# Patient Record
Sex: Female | Born: 1950
Health system: Southern US, Community
[De-identification: ages and names within clinical notes are randomized; demographics above are authoritative.]

## PROBLEM LIST (undated history)

## (undated) DIAGNOSIS — J189 Pneumonia, unspecified organism: Secondary | ICD-10-CM

## (undated) DIAGNOSIS — E785 Hyperlipidemia, unspecified: Secondary | ICD-10-CM

## (undated) DIAGNOSIS — F419 Anxiety disorder, unspecified: Secondary | ICD-10-CM

## (undated) DIAGNOSIS — J90 Pleural effusion, not elsewhere classified: Secondary | ICD-10-CM

## (undated) DIAGNOSIS — E079 Disorder of thyroid, unspecified: Secondary | ICD-10-CM

## (undated) DIAGNOSIS — M069 Rheumatoid arthritis, unspecified: Secondary | ICD-10-CM

## (undated) HISTORY — PX: COLONOSCOPY: SHX174

## (undated) HISTORY — DX: Hyperlipidemia, unspecified: E78.5

## (undated) HISTORY — DX: Anxiety disorder, unspecified: F41.9

## (undated) HISTORY — PX: LAPAROSCOPY: SHX197

## (undated) HISTORY — DX: Disorder of thyroid, unspecified: E07.9

## (undated) HISTORY — PX: TUBAL LIGATION: SHX77

---

## 1993-10-26 DIAGNOSIS — F418 Other specified anxiety disorders: Secondary | ICD-10-CM | POA: Insufficient documentation

## 1993-10-26 HISTORY — PX: VAGINAL HYSTERECTOMY: SHX2639

## 2003-10-27 DIAGNOSIS — E039 Hypothyroidism, unspecified: Secondary | ICD-10-CM | POA: Insufficient documentation

## 2007-10-27 DIAGNOSIS — E7800 Pure hypercholesterolemia, unspecified: Secondary | ICD-10-CM | POA: Insufficient documentation

## 2007-10-27 DIAGNOSIS — E785 Hyperlipidemia, unspecified: Secondary | ICD-10-CM | POA: Insufficient documentation

## 2008-10-26 DIAGNOSIS — M069 Rheumatoid arthritis, unspecified: Secondary | ICD-10-CM | POA: Insufficient documentation

## 2009-07-13 ENCOUNTER — Ambulatory Visit: Payer: Self-pay | Admitting: Diagnostic Radiology

## 2009-07-13 ENCOUNTER — Emergency Department (HOSPITAL_BASED_OUTPATIENT_CLINIC_OR_DEPARTMENT_OTHER): Admission: EM | Admit: 2009-07-13 | Discharge: 2009-07-13 | Payer: Self-pay | Admitting: Emergency Medicine

## 2011-01-30 LAB — CBC
HCT: 39.2 % (ref 36.0–46.0)
Hemoglobin: 13.7 g/dL (ref 12.0–15.0)
MCHC: 35 g/dL (ref 30.0–36.0)
MCV: 87.1 fL (ref 78.0–100.0)
Platelets: 245 10*3/uL (ref 150–400)
RBC: 4.5 MIL/uL (ref 3.87–5.11)
RDW: 11.3 % — ABNORMAL LOW (ref 11.5–15.5)
WBC: 9.1 10*3/uL (ref 4.0–10.5)

## 2011-01-30 LAB — BASIC METABOLIC PANEL
BUN: 20 mg/dL (ref 6–23)
CO2: 31 mEq/L (ref 19–32)
Calcium: 9.5 mg/dL (ref 8.4–10.5)
Chloride: 102 mEq/L (ref 96–112)
Creatinine, Ser: 0.8 mg/dL (ref 0.4–1.2)
GFR calc Af Amer: >60 mL/min (ref >60)
GFR calc non Af Amer: >60 mL/min (ref >60)
Glucose, Bld: 96 mg/dL (ref 70–99)
Potassium: 4 mEq/L (ref 3.5–5.1)
Sodium: 140 mEq/L (ref 135–145)

## 2011-01-30 LAB — DIFFERENTIAL
Basophils Absolute: 0.2 10*3/uL — ABNORMAL HIGH (ref 0.0–0.1)
Basophils Relative: 2 % — ABNORMAL HIGH (ref 0–1)
Eosinophils Absolute: 0.1 10*3/uL (ref 0.0–0.7)
Eosinophils Relative: 1 % (ref 0–5)
Lymphocytes Relative: 12 % (ref 12–46)
Lymphs Abs: 1.1 10*3/uL (ref 0.7–4.0)
Monocytes Absolute: 0.6 10*3/uL (ref 0.1–1.0)
Monocytes Relative: 7 % (ref 3–12)
Neutro Abs: 7.1 10*3/uL (ref 1.7–7.7)
Neutrophils Relative %: 78 % — ABNORMAL HIGH (ref 43–77)

## 2011-08-03 DIAGNOSIS — M069 Rheumatoid arthritis, unspecified: Secondary | ICD-10-CM | POA: Insufficient documentation

## 2012-01-11 ENCOUNTER — Other Ambulatory Visit (HOSPITAL_BASED_OUTPATIENT_CLINIC_OR_DEPARTMENT_OTHER): Payer: Self-pay | Admitting: *Deleted

## 2012-01-11 ENCOUNTER — Other Ambulatory Visit (HOSPITAL_BASED_OUTPATIENT_CLINIC_OR_DEPARTMENT_OTHER): Payer: Self-pay | Admitting: Family Medicine

## 2012-01-11 DIAGNOSIS — Z1231 Encounter for screening mammogram for malignant neoplasm of breast: Secondary | ICD-10-CM

## 2012-01-18 ENCOUNTER — Ambulatory Visit (HOSPITAL_BASED_OUTPATIENT_CLINIC_OR_DEPARTMENT_OTHER)
Admission: RE | Admit: 2012-01-18 | Discharge: 2012-01-18 | Disposition: A | Discharge status: Home / Self Care | Payer: 59 | Source: Ambulatory Visit | Attending: Family Medicine | Admitting: Family Medicine

## 2012-01-18 DIAGNOSIS — Z1231 Encounter for screening mammogram for malignant neoplasm of breast: Secondary | ICD-10-CM | POA: Insufficient documentation

## 2012-04-05 ENCOUNTER — Encounter: Payer: Self-pay | Admitting: Pharmacist

## 2012-04-05 ENCOUNTER — Ambulatory Visit (INDEPENDENT_AMBULATORY_CARE_PROVIDER_SITE_OTHER): Payer: 59 | Admitting: Pharmacist

## 2012-04-05 VITALS — BP 137/81 | HR 72 | Ht 64.0 in | Wt 156.0 lb

## 2012-04-05 DIAGNOSIS — F341 Dysthymic disorder: Secondary | ICD-10-CM

## 2012-04-05 DIAGNOSIS — F418 Other specified anxiety disorders: Secondary | ICD-10-CM

## 2012-04-05 DIAGNOSIS — E785 Hyperlipidemia, unspecified: Secondary | ICD-10-CM

## 2012-04-05 DIAGNOSIS — E039 Hypothyroidism, unspecified: Secondary | ICD-10-CM

## 2012-04-05 DIAGNOSIS — M069 Rheumatoid arthritis, unspecified: Secondary | ICD-10-CM

## 2012-04-05 NOTE — Assessment & Plan Note (Signed)
Following medication review, no suggestions for change. We discussed using DMARDs such as Enbrel and her concerns with side effects. Patient understands it is her decision in regards to starting such medications and importance of balancing the benefits with the risks.  Complete medication list provided to patient.  Total time in face to face medication review: 20 minutes.  Patient seen with: Brett Fairy, PharmD, Pharmacy Resident.

## 2012-04-05 NOTE — Progress Notes (Signed)
  Subjective:    Patient ID: Pamela Ingram, female    DOB: [birth year], 61 y.o.   MRN: [id number]  HPI Patient arrives in good spirits for medication review.  Reports seeing Armando Gang, NP ([facility]) as primary care provider, Dr. Sigurd Sos [facility]) as rheumatologist.  Reports being diagnosed with rheumatoid arthritis since 2010 and is currently taking prednisone bursts.     Review of Systems     Objective:   Physical Exam        Assessment & Plan:  Following medication review, no suggestions for change. We discussed using DMARDs such as Enbrel and her concerns with side effects. Patient understands it is her decision in regards to starting such medications and importance of balancing the benefits with the risks.  Complete medication list provided to patient.  Total time in face to face medication review: 20 minutes.  Patient seen with: Brett Fairy, PharmD, Pharmacy Resident.

## 2012-04-05 NOTE — Progress Notes (Signed)
Patient ID: Pamela Ingram, female   DOB: 04/16/1951, 61 y.o.   MRN: [id number] Reviewed and agree with Dr. Macky Lower management.

## 2012-04-05 NOTE — Patient Instructions (Addendum)
1. Begin taking 1000 IU vitamin D daily 2. Take one pill of calcium citrate daily with water + meal but apart from other dairy products. Aim for up to 1200mg  calcium a day.

## 2012-09-15 ENCOUNTER — Encounter: Payer: Self-pay | Admitting: Internal Medicine

## 2012-10-14 ENCOUNTER — Ambulatory Visit (AMBULATORY_SURGERY_CENTER): Payer: 59 | Admitting: *Deleted

## 2012-10-14 ENCOUNTER — Ambulatory Visit: Payer: 59

## 2012-10-14 VITALS — Ht 64.0 in | Wt 153.0 lb

## 2012-10-14 DIAGNOSIS — Z1211 Encounter for screening for malignant neoplasm of colon: Secondary | ICD-10-CM

## 2012-10-18 ENCOUNTER — Encounter: Payer: Self-pay | Admitting: *Deleted

## 2012-10-25 ENCOUNTER — Encounter: Payer: Self-pay | Admitting: Internal Medicine

## 2012-10-25 ENCOUNTER — Ambulatory Visit (AMBULATORY_SURGERY_CENTER): Payer: 59 | Admitting: Internal Medicine

## 2012-10-25 VITALS — BP 111/47 | HR 69 | Temp 97.9°F | Resp 12 | Ht 64.0 in | Wt 153.0 lb

## 2012-10-25 DIAGNOSIS — Z1211 Encounter for screening for malignant neoplasm of colon: Secondary | ICD-10-CM

## 2012-10-25 MED ORDER — SODIUM CHLORIDE 0.9 % IV SOLN: 500.0000 mL | INTRAVENOUS | Status: DC

## 2012-10-25 NOTE — Progress Notes (Signed)
Patient did not experience any of the following events: a burn prior to discharge; a fall within the facility; wrong site/side/patient/procedure/implant event; or a hospital transfer or hospital admission upon discharge from the facility. (G8907) Patient did not have preoperative order for IV antibiotic SSI prophylaxis. (G8918)  

## 2012-10-25 NOTE — Patient Instructions (Addendum)
Your colonoscopy was normal!  Let's so this again in 2023.  Thank you for choosing me and [facility].  Iva Boop, MD, [facility]  Discharge instructions given with verbal understanding. Normal exam. Resume previous medications. Please refer to the blue and neon green sheets for instructions regarding diet and activity for the rest of today.  YOU HAD AN ENDOSCOPIC PROCEDURE TODAY AT THE [facility]: Refer to the procedure report that was given to you for any specific questions about what was found during the examination.  If the procedure report does not answer your questions, please call your gastroenterologist to clarify.  If you requested that your care partner not be given the details of your procedure findings, then the procedure report has been included in a sealed envelope for you to review at your convenience later.  YOU SHOULD EXPECT: Some feelings of bloating in the abdomen. Passage of more gas than usual.  Walking can help get rid of the air that was put into your GI tract during the procedure and reduce the bloating. If you had a lower endoscopy (such as a colonoscopy or flexible sigmoidoscopy) you may notice spotting of blood in your stool or on the toilet paper. If you underwent a bowel prep for your procedure, then you may not have a normal bowel movement for a few days.  DIET: Your first meal following the procedure should be a light meal and then it is ok to progress to your normal diet.  A half-sandwich or bowl of soup is an example of a good first meal.  Heavy or fried foods are harder to digest and may make you feel nauseous or bloated.  Likewise meals heavy in dairy and vegetables can cause extra gas to form and this can also increase the bloating.  Drink plenty of fluids but you should avoid alcoholic beverages for 24 hours.  ACTIVITY: Your care partner should take you home directly after the procedure.  You should plan to take it easy, moving slowly  for the rest of the day.  You can resume normal activity the day after the procedure however you should NOT DRIVE or use heavy machinery for 24 hours (because of the sedation medicines used during the test).    SYMPTOMS TO REPORT IMMEDIATELY: A gastroenterologist can be reached at any hour.  During normal business hours, 8:30 AM to 5:00 PM Monday through Friday, call [phone].  After hours and on weekends, please call the GI answering service at [phone] who will take a message and have the physician on call contact you.   Following lower endoscopy (colonoscopy or flexible sigmoidoscopy):  Excessive amounts of blood in the stool  Significant tenderness or worsening of abdominal pains  Swelling of the abdomen that is new, acute  Fever of 100F or higher  FOLLOW UP: If any biopsies were taken you will be contacted by phone or by letter within the next 1-3 weeks.  Call your gastroenterologist if you have not heard about the biopsies in 3 weeks.  Our staff will call the home number listed on your records the next business day following your procedure to check on you and address any questions or concerns that you may have at that time regarding the information given to you following your procedure. This is a courtesy call and so if there is no answer at the home number and we have not heard from you through the emergency physician on call, we will assume that you  have returned to your regular daily activities without incident.  SIGNATURES/CONFIDENTIALITY: You and/or your care partner have signed paperwork which will be entered into your electronic medical record.  These signatures attest to the fact that that the information above on your After Visit Summary has been reviewed and is understood.  Full responsibility of the confidentiality of this discharge information lies with you and/or your care-partner.  

## 2012-10-25 NOTE — Op Note (Signed)
[facility] [address] [place], [address]   COLONOSCOPY PROCEDURE REPORT  PATIENT: Pamela Ingram, Pamela Ingram  MR#: [id number] BIRTHDATE: 1951/01/05 , 61  yrs. old GENDER: Female ENDOSCOPIST: Iva Boop, MD, [facility] REFERRED BY:  Daneil Dolin, MD PROCEDURE DATE:  10/25/2012 PROCEDURE:   Colonoscopy, diagnostic ASA CLASS:   Class I INDICATIONS:average risk screening. MEDICATIONS: propofol (Diprivan) 300mg  IV, MAC sedation, administered by CRNA, and These medications were titrated to patient response per physician's verbal order  DESCRIPTION OF PROCEDURE:   After the risks benefits and alternatives of the procedure were thoroughly explained, informed consent was obtained.  A digital rectal exam revealed no abnormalities of the rectum.   The LB [id number]  endoscope was introduced through the anus and advanced to the cecum, which was identified by both the appendix and ileocecal valve. No adverse events experienced.   The quality of the prep was Suprep good  The instrument was then slowly withdrawn as the colon was fully examined.      COLON FINDINGS: A normal appearing cecum, ileocecal valve, and appendiceal orifice were identified.  The ascending, hepatic flexure, transverse, splenic flexure, descending, sigmoid colon and rectum appeared unremarkable.  No polyps or cancers were seen.   A right colon retroflexion was performed.  Retroflexed views revealed no abnormalities. The time to cecum=6 minutes 26 seconds. Withdrawal time=12 minutes 46 seconds.  The scope was withdrawn and the procedure completed. COMPLICATIONS: There were no complications.  ENDOSCOPIC IMPRESSION: Normal colonoscopy, good prep  RECOMMENDATIONS: Repeat Colonscopy in 10 years.   eSigned:  Iva Boop, MD, [facility] 10/25/2012 12:02 PM   cc: The Patient    and Daneil Dolin, MD

## 2012-10-27 ENCOUNTER — Telehealth: Payer: Self-pay

## 2012-10-27 ENCOUNTER — Encounter: Payer: Self-pay | Admitting: *Deleted

## 2012-10-27 NOTE — Telephone Encounter (Signed)
Error. ewm

## 2012-10-27 NOTE — Telephone Encounter (Signed)
  Follow up Call-  Call back number 10/25/2012  Post procedure Call Back phone  # [phone]  Permission to leave phone message Yes     Patient questions:  Do you have a fever, pain , or abdominal swelling? no Pain Score  0 *  Have you tolerated food without any problems? yes  Have you been able to return to your normal activities? yes  Do you have any questions about your discharge instructions: Diet   no Medications  no Follow up visit  no  Do you have questions or concerns about your Care? no  Actions: * If pain score is 4 or above: No action needed, pain <4.   No problems per the pt.  "everyone was wonderful". Maw

## 2012-12-20 ENCOUNTER — Ambulatory Visit (INDEPENDENT_AMBULATORY_CARE_PROVIDER_SITE_OTHER): Payer: Self-pay | Admitting: Family Medicine

## 2012-12-20 DIAGNOSIS — Z711 Person with feared health complaint in whom no diagnosis is made: Secondary | ICD-10-CM

## 2013-01-02 ENCOUNTER — Emergency Department (HOSPITAL_BASED_OUTPATIENT_CLINIC_OR_DEPARTMENT_OTHER): Payer: 59

## 2013-01-02 ENCOUNTER — Encounter (HOSPITAL_BASED_OUTPATIENT_CLINIC_OR_DEPARTMENT_OTHER): Payer: Self-pay | Admitting: Family Medicine

## 2013-01-02 ENCOUNTER — Emergency Department (HOSPITAL_BASED_OUTPATIENT_CLINIC_OR_DEPARTMENT_OTHER)
Admission: EM | Admit: 2013-01-02 | Discharge: 2013-01-02 | Disposition: A | Discharge status: Home / Self Care | Payer: 59 | Attending: Emergency Medicine | Admitting: Emergency Medicine

## 2013-01-02 DIAGNOSIS — J3489 Other specified disorders of nose and nasal sinuses: Secondary | ICD-10-CM | POA: Insufficient documentation

## 2013-01-02 DIAGNOSIS — H9209 Otalgia, unspecified ear: Secondary | ICD-10-CM | POA: Insufficient documentation

## 2013-01-02 DIAGNOSIS — Z8739 Personal history of other diseases of the musculoskeletal system and connective tissue: Secondary | ICD-10-CM | POA: Insufficient documentation

## 2013-01-02 DIAGNOSIS — R0981 Nasal congestion: Secondary | ICD-10-CM

## 2013-01-02 DIAGNOSIS — Z79899 Other long term (current) drug therapy: Secondary | ICD-10-CM | POA: Insufficient documentation

## 2013-01-02 DIAGNOSIS — E039 Hypothyroidism, unspecified: Secondary | ICD-10-CM | POA: Insufficient documentation

## 2013-01-02 DIAGNOSIS — E785 Hyperlipidemia, unspecified: Secondary | ICD-10-CM | POA: Insufficient documentation

## 2013-01-02 DIAGNOSIS — R51 Headache: Secondary | ICD-10-CM | POA: Insufficient documentation

## 2013-01-02 DIAGNOSIS — R110 Nausea: Secondary | ICD-10-CM | POA: Insufficient documentation

## 2013-01-02 DIAGNOSIS — IMO0002 Reserved for concepts with insufficient information to code with codable children: Secondary | ICD-10-CM | POA: Insufficient documentation

## 2013-01-02 DIAGNOSIS — F411 Generalized anxiety disorder: Secondary | ICD-10-CM | POA: Insufficient documentation

## 2013-01-02 DIAGNOSIS — Z87891 Personal history of nicotine dependence: Secondary | ICD-10-CM | POA: Insufficient documentation

## 2013-01-02 HISTORY — DX: Rheumatoid arthritis, unspecified: M06.9

## 2013-01-02 MED ORDER — ONDANSETRON 8 MG PO TBDP
8.0000 mg | ORAL_TABLET | Freq: Once | ORAL | Status: AC
  Administered 2013-01-02: 8 mg via ORAL
  Filled 2013-01-02: qty 1

## 2013-01-02 MED ORDER — CETIRIZINE-PSEUDOEPHEDRINE ER 5-120 MG PO TB12: 1.0000 | ORAL_TABLET | Freq: Every day | ORAL | Status: DC

## 2013-01-02 MED ORDER — KETOROLAC TROMETHAMINE 60 MG/2ML IM SOLN: 60.0000 mg | Freq: Once | INTRAMUSCULAR | Status: DC

## 2013-01-02 MED ORDER — KETOROLAC TROMETHAMINE 60 MG/2ML IM SOLN
60.0000 mg | Freq: Once | INTRAMUSCULAR | Status: AC
  Administered 2013-01-02: 60 mg via INTRAMUSCULAR
  Filled 2013-01-02: qty 2

## 2013-01-02 MED ORDER — FLUTICASONE PROPIONATE 50 MCG/ACT NA SUSP: 2.0000 | Freq: Every day | NASAL | Status: DC

## 2013-01-02 MED ORDER — HYDROCODONE-ACETAMINOPHEN 5-325 MG PO TABS
1.0000 | ORAL_TABLET | Freq: Once | ORAL | Status: AC
  Administered 2013-01-02: 1 via ORAL
  Filled 2013-01-02: qty 1

## 2013-01-02 NOTE — ED Notes (Signed)
Pt c/o headache since awakening and c/o sinus pressure. Pt sts she feels nauseous.

## 2013-01-02 NOTE — ED Notes (Signed)
Patient transported to X-ray 

## 2013-01-02 NOTE — ED Provider Notes (Signed)
History     CSN: [id number]  Arrival date & time 01/02/13  1208   First MD Initiated Contact with Patient 01/02/13 1252      Chief Complaint  Patient presents with  . Headache    (Consider location/radiation/quality/duration/timing/severity/associated sxs/prior treatment) HPI Pamela Ingram is a 62 y.o. female who presents to ED with complaint of headache, nausea. Pt states she woke up with this headache. Pain to the left side of the head, nasal congestion. States feels like having sinus pressure. Tried steamy room and ibuprofen, with no relief. States headache making her nauseated, vomited x1 after taking ibuprofen. Denies photophobia or sensitivity to sound. Headache constant since this morning, but states feels like easing off some. No visual changes. No pain in the temples. No neck pain or stiffness. States recently has had "issues" with alternating ear pain and teeth pain. Was seen for this, told this may be eustachian tube issue. States currently is having pain in left ear, and pain with she chews. No pain with movement of the jaw. Denies dizziness, lightheadedness, weakness or numbness of extremities.  Past Medical History  Diagnosis Date  . Anxiety   . Hyperlipidemia   . Thyroid disease     hypothyroidism  . Rheumatoid arthritis     Past Surgical History  Procedure Laterality Date  . Vaginal hysterectomy  1995  . Tubal ligation    .  laparoscospy    . Colonoscopy      Family History  Problem Relation Age of Onset  . Colon cancer Neg Hx   . Rectal cancer Neg Hx   . Stomach cancer Neg Hx   . Esophageal cancer Neg Hx     History  Substance Use Topics  . Smoking status: Former Smoker -- 0.50 packs/day for 44 years    Quit date: 10/26/2009  . Smokeless tobacco: Never Used  . Alcohol Use: Yes     Comment: occasional    OB History   Grav Para Term Preterm Abortions TAB SAB Ect Mult Living                  Review of Systems  Constitutional: Negative for fever and  chills.  HENT: Positive for ear pain, congestion and sinus pressure. Negative for sore throat, neck pain and neck stiffness.   Eyes: Negative for photophobia, pain and visual disturbance.  Respiratory: Negative.   Cardiovascular: Negative.   Gastrointestinal: Positive for nausea. Negative for vomiting, abdominal pain and diarrhea.  Endocrine: Negative.   Genitourinary: Negative for dysuria.  Musculoskeletal: Negative for myalgias.  Skin: Negative for rash.  Neurological: Positive for headaches. Negative for dizziness, speech difficulty, weakness, light-headedness and numbness.    Allergies  Amoxicillin  Home Medications   Current Outpatient Rx  Name  Route  Sig  Dispense  Refill  . ALPRAZolam (XANAX) 0.5 MG tablet   Oral   Take 1 tablet (0.5 mg total) by mouth daily as needed for sleep.         . Cholecalciferol (VITAMIN D PO)   Oral   Take 1 tablet by mouth daily.         . DULoxetine (CYMBALTA) 60 MG capsule   Oral   Take 1 capsule (60 mg total) by mouth daily.         . folic acid (FOLVITE) 1 MG tablet   Oral   Take 1 mg by mouth daily.         [name] levothyroxine (SYNTHROID, LEVOTHROID) 50 MCG tablet  Oral   Take 1 tablet (50 mcg total) by mouth daily.         . methotrexate (RHEUMATREX) 2.5 MG tablet   Oral   Take 8 tablets (20 mg total) by mouth once a week. Caution:Chemotherapy. Protect from light. Take once a week on saturdays         . predniSONE (DELTASONE) 5 MG tablet   Oral   Take 3 tablets (15 mg total) by mouth daily. Follow taper         . simvastatin (ZOCOR) 80 MG tablet   Oral   Take 1 tablet (80 mg total) by mouth at bedtime.           BP 121/71  Pulse 84  Temp(Src) 97.7 F (36.5 C) (Oral)  Resp 26  SpO2 98%  Physical Exam  Nursing note and vitals reviewed. Constitutional: She is oriented to person, place, and time. She appears well-developed and well-nourished. No distress.  HENT:  Head: Normocephalic and atraumatic.   Right Ear: External ear normal.  Left Ear: External ear normal.  Nose: Nose normal.  Mouth/Throat: Oropharynx is clear and moist.  TMs normal bilaterally. No tenderness over bilateral TMJs and temporal area.   Eyes: Conjunctivae and EOM are normal. Pupils are equal, round, and reactive to light.  Neck: Normal range of motion. Neck supple.  Cardiovascular: Normal rate, regular rhythm and normal heart sounds.   Pulmonary/Chest: Effort normal and breath sounds normal. No respiratory distress. She has no wheezes. She has no rales.  Abdominal: Soft. Bowel sounds are normal. She exhibits no distension. There is no tenderness. There is no rebound.  Musculoskeletal: She exhibits no edema.  Neurological: She is alert and oriented to person, place, and time. No cranial nerve deficit. Coordination normal.  Visual fields intact. 5/5 and equal upper and lower extremity strength bilaterally. Equal grip strength bilaterally. Normal finger to nose and heel to shin. No pronator drift.  Skin: Skin is warm and dry.  Psychiatric: She has a normal mood and affect.    ED Course  Procedures (including critical care time)  PT is a 61yo with new headache. Her neuro exam is normal. Discussed with Dr. Denton Lank, will get CT head, vicodin ordrered for pain.   Ct Head Wo Contrast  01/02/2013  *RADIOLOGY REPORT*  Clinical Data: Left parietal headache since this morning.  Nausea.  CT HEAD WITHOUT CONTRAST  Technique:  Contiguous axial images were obtained from the base of the skull through the vertex without contrast.  Comparison: None.  Findings: Mildly enlarged ventricles and subarachnoid spaces. Otherwise, normal appearing cerebral hemispheres and posterior fossa structures.  No intracranial hemorrhage, mass lesion or CT evidence of acute infarction.  Unremarkable bones and included paranasal sinuses.  Pneumatization of the anterior aspect of the right middle nasal turbinate.  IMPRESSION:  1.  No acute abnormality. 2.   Mild diffuse cerebral atrophy.   Original Report Authenticated By: Beckie Salts, M.D.       1. Headache   2. Nasal congestion       MDM  Pt with left sided headache. No hx of migraines. Having some nasal congestion also onset this morning. Woke up with this pain, pain improving. No neck pain or stiffness, no meningismus. No thunderclap type headache, doubt SAH. Normal neuro exam. CT head negative. Pt feeling better with vicodin and toradol IM. Pt stable for d/c home with close follow up.           Lottie Mussel, PA-C  01/02/13 1425 

## 2013-01-03 NOTE — ED Provider Notes (Signed)
Medical screening examination/treatment/procedure(s) were conducted as a shared visit with non-physician practitioner(s) and myself.  I personally evaluated the patient during the encounter Pt c/o frontal headache.  Somewhat similar to prior but much more prolonged. No neck pain or stiffness. No eye pain or change in vision. No neck stiffness on exam. No temporal tenderness. Motor intact. Steady gait.   Suzi Roots, MD 01/03/13 2032

## 2013-03-13 ENCOUNTER — Encounter: Payer: Self-pay | Admitting: Family Medicine

## 2013-03-13 ENCOUNTER — Ambulatory Visit (INDEPENDENT_AMBULATORY_CARE_PROVIDER_SITE_OTHER): Payer: 59 | Admitting: Family Medicine

## 2013-03-13 VITALS — Ht 64.0 in | Wt 156.7 lb

## 2013-03-13 DIAGNOSIS — E785 Hyperlipidemia, unspecified: Secondary | ICD-10-CM

## 2013-03-13 DIAGNOSIS — E663 Overweight: Secondary | ICD-10-CM

## 2013-03-13 DIAGNOSIS — Z6825 Body mass index (BMI) 25.0-25.9, adult: Secondary | ICD-10-CM

## 2013-03-13 NOTE — Progress Notes (Signed)
Medical Nutrition Therapy:  Appt start time: 0900 end time:  1000.  Assessment:  Primary concerns today: Weight management.  Yuriko has a h/o hyperlipidemia, which is medication-controlled, but she is more concerned about her current weight.  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  Her work is now day-time, and she has time for meals, but she can't manage to lose the wt.   Aryonna has recently had pain that does not seem to be related to her RA, including left chest pain yesterday (accompanied by fever) that was so severe, she had to lie down.  Will see her PCP today re. this.   Usual eating pattern includes 3 meals and 1 snack per day. Usual physical activity includes walking 30 min 3-4 X wk. Frequent foods include water, diet drinks 3-4 X wk, 3-4 c coffee w/ powdered creamer.  Avoided foods include none.   Tishie gets up at 5:15 AM on weekdays, and usual bedtime is ~11 PM.    24-hr recall: (Up at 7 AM) B ( AM)-   Coffee w/ creamer (no bkfst; grandchildren visiting) Snk ( AM)-   none L ( PM)-  None (b/c of severe pain; laid down) Snk ( PM)-  none D ( PM)-  1 hamburger w/ chs, no bread, 1/2 c waffle fries, 3 full graham crackers (@60  kcal), 1/2 c fruit [place] yogurt Snk ( PM)-  1 1/2 Twizzlers, 1 orange slice candy  Yesterday was atypical; more typical day:    B ( AM)-  1 1/2 c raisin bran w/ 1/2 c almond milk Snk ( AM)-  none L ( PM)-  pb sandw & fruit OR dinner leftovers Snk ( PM)-  none D ( PM)-  Meat, veg, +/- starch Snk ( PM)-  Yogurt, cinnamon graham crackers (eats hs snack ~3 X wk; trying to not eat after dinner)  Progress Towards Goal(s):  In progress.   Nutritional Diagnosis:  NB-2.1 Physical inactivity As related to nutritional intake.  As evidenced by exercise limited to 30-min walk 3-4 X wk. [place]-3.3 Overweight/obesity As related to positive energy balance.  As evidenced by BMI >25.    Intervention:  Nutrition  education.  Monitoring/Evaluation:  Dietary intake, exercise, and body weight in 1 month(s).

## 2013-03-13 NOTE — Patient Instructions (Addendum)
-   You probably need more sleep.    - Goal:  Track the number of hours of sleep you get nightly, and keep a tally of the weekly sleep hours.   - Watch this video, Pamela Ingram:  [email or web address].[email or web address]  - One point to emphasize:  Effects of evening walk.  - Goal:  At least 30 min walk after dinner 5 X wk.  - Track your # of minutes walked each time.   - Goal: Obtain twice as many veg's as protein or carbohydrate foods for both lunch and dinner.  - Include some protein at breakfast, i.e., a full cup of soy/dairy milk, 1-2 eggs, ([place]) yogurt (half plain, half sweetened).  - Track your meals that include protein and veg's.  - Write a list of 7-10 meals that fulfill these conditions:  - Taste food.  - Relatively easy to prepare.  - meet your nutrition needs.   - Bring this list to your next appt.

## 2013-03-14 ENCOUNTER — Ambulatory Visit (INDEPENDENT_AMBULATORY_CARE_PROVIDER_SITE_OTHER): Payer: 59 | Admitting: Pulmonary Disease

## 2013-03-14 ENCOUNTER — Encounter: Payer: Self-pay | Admitting: Pulmonary Disease

## 2013-03-14 VITALS — BP 124/60 | HR 93 | Temp 96.9°F | Ht 64.0 in | Wt 162.2 lb

## 2013-03-14 DIAGNOSIS — J90 Pleural effusion, not elsewhere classified: Secondary | ICD-10-CM

## 2013-03-14 MED ORDER — IBUPROFEN 200 MG PO CAPS: 800.0000 mg | ORAL_CAPSULE | Freq: Three times a day (TID) | ORAL | Status: DC

## 2013-03-14 NOTE — Patient Instructions (Signed)
Ibuprofen 800 mg three times daily for 1 week, and then gradually taper dose as tolerated Follow up in 3 to 4 weeks with chest xray

## 2013-03-14 NOTE — Assessment & Plan Note (Addendum)
Her symptoms started as upper respiratory with cough, and progressed to left sided pleuritic chest pain.  This is in the setting of rheumatoid arthritis on methotrexate.  She reports gradual symptomatic improvement after treatment with prednisone, antibiotics, and now scheduled ibuprofen.  She has mild left base pleural thickening and small pleural effusion on recent CT chest >> not large enough to warrant any invasive interventions for sampling.  There are no other worrisome findings on CT chest.    Advised her to continue scheduled ibuprofen 800 mg tid for one week, and then gradually taper off as tolerated.  Will then repeat chest xray in 3 to 4 weeks.

## 2013-03-14 NOTE — Progress Notes (Signed)
Chief Complaint  Patient presents with  . Advice Only    C/o atypical CP, and ct finding from yesterday.     History of Present Illness: Pamela Ingram is a 62 y.o. female former smoker with hx of rheumatoid arthritis for evaluation of chest pain.  She was doing well until 3 weeks ago.  She was seen April 21 for an earache,and hoarseness.  She felt like she was getting a cold and was coughing a lot.  She was seen again on April 26 with concern for sinusitis.    She was given a Zpak >> this helped some.  She developed severe mid-sternal chest pain.  This was persistent and felt like some one was stabbing her with a knife, and she couldn't sleep.  She thought she pulled a muscle from coughing.  She tried muscle relaxer and ibuprofen.  She was seen again on May 3, and was noted to have wheezing on exam.  She was given prednisone taper and hydrocodone.  Prednisone seemed to help some, but her symptoms recurred as prednisone was decreased.  She would get pain any time she moved the left side of her chest or take a deep breath, and her breathing would catch if she takes a deep breath.  She was seen again on May 6.  She ECG reported normal at that time even though she was having chest pain.  She was tried on dexilant, but this did not help >> she took this for a few days.  She had temperature up to 100.2 associated with chills associated with recurrence of her pain.  She was seen again on May 19.  She had CT chest and lab work done.  She has since been taking 800 mg ibuprofen TID, and pain medication.  She feels her pain is now 75% better.  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  She spoke with GI physicians, and was advised her symptoms weren't typical for reflux as cause of her pain.  She is scheduled for cardiac stress test in June with PCP.  She currently does not have cough.  She denies sputum, hemoptysis, skin rash, gland swelling, leg swelling, nausea, or heartburn.    She has history of  rheumatoid arthritis, and is followed by rheumatology at [facility].  She has been on methotrexate for about 3 years.  She was previously on enbrel, but was not able to tolerate this (made her feel funny).  She is from [place], and denies travel.  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  She quit smoking several years ago.  There is no prior history of pneumonia or exposure to tuberculosis.  She had CT chest 03/13/13 which showed left base pleural thickening with small left pleural effusion.  ESR was 82 from 03/13/13.  Other lab results normal.  Tests: CT chest 03/13/13 >> small Lt pleural effusion with pleural thickening.  Pamela Ingram  has a past medical history of Anxiety; Hyperlipidemia; Thyroid disease; and Rheumatoid arthritis.  Pamela Ingram  has past surgical history that includes Vaginal hysterectomy (1995); Tubal ligation;  laparoscospy; and Colonoscopy.  Prior to Admission medications   Medication Sig Start Date End Date Taking? Authorizing Provider  ALPRAZolam Prudy Feeler) 0.5 MG tablet Take 1 tablet (0.5 mg total) by mouth daily as needed for sleep. 04/05/12  Yes Sanjuana Letters, MD  Cholecalciferol (VITAMIN D PO) Take 1 tablet by mouth daily.   Yes Historical Provider, MD  DULoxetine (CYMBALTA) 60 MG  capsule Take 1 capsule (60 mg total) by mouth daily. 04/05/12  Yes Sanjuana Letters, MD  folic acid (FOLVITE) 1 MG tablet Take 1 mg by mouth daily.   Yes Historical Provider, MD  levothyroxine (SYNTHROID, LEVOTHROID) 50 MCG tablet Take 1 tablet (50 mcg total) by mouth daily. 04/05/12  Yes Sanjuana Letters, MD  methotrexate (RHEUMATREX) 2.5 MG tablet Take 8 tablets (20 mg total) by mouth once a week. Caution:Chemotherapy. Protect from light. Take once a week on saturdays 04/05/12  Yes Sanjuana Letters, MD  simvastatin (ZOCOR) 80 MG tablet Take 1 tablet (80 mg total) by mouth at bedtime. 04/05/12  Yes Sanjuana Letters, MD    Allergies  Allergen Reactions  .  Amoxicillin Other (See Comments)    Pt gets yeast infection    Her family history includes Emphysema in her mother and Rheum arthritis in her mother.  There is no history of Colon cancer, and Rectal cancer, and Stomach cancer, and Esophageal cancer, .  She  reports that she quit smoking about 3 years ago. She has never used smokeless tobacco. She reports that  drinks alcohol. She reports that she does not use illicit drugs.  Review of Systems  Constitutional: Negative for fever, appetite change and unexpected weight change.  HENT: Negative for ear pain, congestion, sore throat, sneezing, trouble swallowing and dental problem.   Respiratory: Positive for cough. Negative for shortness of breath.   Cardiovascular: Positive for chest pain. Negative for palpitations and leg swelling.  Gastrointestinal: Negative for abdominal pain.  Musculoskeletal: Negative for joint swelling.  Skin: Negative for rash.  Neurological: Negative for headaches.  Psychiatric/Behavioral: Negative for dysphoric mood. The patient is not nervous/anxious.    Physical Exam:  General - No distress ENT - No sinus tenderness, no oral exudate, no LAN, no thyromegaly, TM clear, pupils equal/reactive Cardiac - s1s2 regular, no murmur, pulses symmetric Chest - No wheeze/rales/dullness, good air entry, decreased respiratory excursion with inhalation due to pain symptoms Back - No focal tenderness Abd - Soft, non-tender, no organomegaly, + bowel sounds Ext - No edema Neuro - Normal strength, cranial nerves intact Skin - No rashes Psych - Normal mood, and behavior  Assessment/Plan:  Coralyn Helling, MD [place] [facility]Critical [facility]/Sleep Pager:  [phone]

## 2013-03-14 NOTE — Progress Notes (Deleted)
  Subjective:    Patient ID: Pamela Ingram, female    DOB: Mar 08, 1951, 62 y.o.   MRN: [id number]  HPI    Review of Systems  Constitutional: Negative for fever, appetite change and unexpected weight change.  HENT: Negative for ear pain, congestion, sore throat, sneezing, trouble swallowing and dental problem.   Respiratory: Positive for cough. Negative for shortness of breath.   Cardiovascular: Positive for chest pain. Negative for palpitations and leg swelling.  Gastrointestinal: Negative for abdominal pain.  Musculoskeletal: Negative for joint swelling.  Skin: Negative for rash.  Neurological: Negative for headaches.  Psychiatric/Behavioral: Negative for dysphoric mood. The patient is not nervous/anxious.        Objective:   Physical Exam        Assessment & Plan:

## 2013-03-23 ENCOUNTER — Encounter: Payer: Self-pay | Admitting: Pulmonary Disease

## 2013-03-23 ENCOUNTER — Ambulatory Visit (INDEPENDENT_AMBULATORY_CARE_PROVIDER_SITE_OTHER)
Admission: RE | Admit: 2013-03-23 | Discharge: 2013-03-23 | Disposition: A | Discharge status: Home / Self Care | Payer: 59 | Source: Ambulatory Visit | Attending: Pulmonary Disease | Admitting: Pulmonary Disease

## 2013-03-23 ENCOUNTER — Ambulatory Visit: Payer: 59 | Admitting: Pulmonary Disease

## 2013-03-23 ENCOUNTER — Telehealth: Payer: Self-pay | Admitting: Pulmonary Disease

## 2013-03-23 ENCOUNTER — Ambulatory Visit (INDEPENDENT_AMBULATORY_CARE_PROVIDER_SITE_OTHER): Payer: 59 | Admitting: Pulmonary Disease

## 2013-03-23 ENCOUNTER — Other Ambulatory Visit (INDEPENDENT_AMBULATORY_CARE_PROVIDER_SITE_OTHER): Payer: 59

## 2013-03-23 VITALS — BP 122/60 | HR 88 | Temp 98.1°F | Ht 64.0 in | Wt 156.0 lb

## 2013-03-23 DIAGNOSIS — J90 Pleural effusion, not elsewhere classified: Secondary | ICD-10-CM

## 2013-03-23 LAB — SEDIMENTATION RATE: Sed Rate: 81 mm/hr — ABNORMAL HIGH (ref 0–22)

## 2013-03-23 MED ORDER — PREDNISONE 10 MG PO TABS: ORAL_TABLET | ORAL | Status: DC

## 2013-03-23 NOTE — Progress Notes (Signed)
Chief Complaint  Patient presents with  . Acute Visit    pt c/o pain in chest x this AM. Feels like "knife is in chest'. Hurts to take a deep breathe. denies any cough, wheezing, chest tx    History of Present Illness: Pamela Ingram is a 62 y.o. female with left pleuritic chest pain and small effusion in setting of rheumatoid arthritis.  She was feeling better and almost back to normal while on scheduled motrin.  She finished this 2 days ago.  She developed more left shoulder pain, and was given flexeril by her PCP.  This didn't help much.  She then developed severe, stabbing left sided chest pain again.  This is much worse if she takes a breaths or moves in certain positions.  She feels like she did when all this started.  She feels short of breath when she is walking >> more because it hurts when she has to breath.  She denies fever, skin rash, chest congestion, cough, sputum, palpitations, reflux, or leg swelling.  TESTS: CT chest 03/13/13 >> small Lt pleural effusion with pleural thickening.   Pamela Ingram  has a past medical history of Anxiety; Hyperlipidemia; Thyroid disease; and Rheumatoid arthritis(714.0).  Pamela Ingram  has past surgical history that includes Vaginal hysterectomy (1995); Tubal ligation;  laparoscospy; and Colonoscopy.  Prior to Admission medications   Medication Sig Start Date End Date Taking? Authorizing Provider  ALPRAZolam Prudy Feeler) 0.5 MG tablet Take 1 tablet (0.5 mg total) by mouth daily as needed for sleep. 04/05/12  Yes Sanjuana Letters, MD  Cholecalciferol (VITAMIN D PO) Take 1 tablet by mouth daily.   Yes Historical Provider, MD  cyclobenzaprine (FLEXERIL) 10 MG tablet Take 10 mg by mouth at bedtime as needed for muscle spasms.   Yes Historical Provider, MD  DULoxetine (CYMBALTA) 60 MG capsule Take 1 capsule (60 mg total) by mouth daily. 04/05/12  Yes Sanjuana Letters, MD  folic acid (FOLVITE) 1 MG tablet Take 1 mg by mouth daily.   Yes Historical Provider,  MD  Ibuprofen 200 MG CAPS Tapering off 03/14/13  Yes Coralyn Helling, MD  levothyroxine (SYNTHROID, LEVOTHROID) 50 MCG tablet Take 1 tablet (50 mcg total) by mouth daily. 04/05/12  Yes Sanjuana Letters, MD  methotrexate (RHEUMATREX) 2.5 MG tablet Take 8 tablets (20 mg total) by mouth once a week. Caution:Chemotherapy. Protect from light. Take once a week on saturdays 04/05/12  Yes Sanjuana Letters, MD  simvastatin (ZOCOR) 80 MG tablet Take 1 tablet (80 mg total) by mouth at bedtime. 04/05/12  Yes Sanjuana Letters, MD    Allergies  Allergen Reactions  . Amoxicillin Other (See Comments)    Pt gets yeast infection     Physical Exam:  General - No distress ENT - No sinus tenderness, no oral exudate, no LAN Cardiac - s1s2 regular, no murmur Chest - decreased respiratory excursion with left sided splinting with deep respiration, no wheeze/rales Back - No focal tenderness Abd - Soft, non-tender Ext - No edema Neuro - Normal strength Skin - No rashes Psych - normal mood, and behavior  Dg Chest 2 View  03/23/2013   *RADIOLOGY REPORT*  Clinical Data: Upper left chest pain  CHEST - 2 VIEW  Comparison: CTA chest dated 03/13/2013  Findings: Small left pleural effusion.  Associated left lower lobe opacity, likely atelectasis.  No pneumothorax.  The heart is top normal in size.  Mild degenerative changes of the visualized thoracolumbar spine.  IMPRESSION: Small left  pleural effusion.  Associated left lower lobe opacity, likely atelectasis.   Original Report Authenticated By: Charline Bills, M.D.    Assessment/Plan:  Coralyn Helling, MD [place] [facility]/Critical Care/Sleep Pager:  [phone]

## 2013-03-23 NOTE — Telephone Encounter (Signed)
Called and spoke with pt-last seen 5/20 She states that her CP had been much better but started to notice it come back last night Pain worse today and very painful to take in a deep breath OV with KC at 11:30 am today

## 2013-03-23 NOTE — Patient Instructions (Signed)
Prednisone 10 mg pill >> 2 pills daily for one week, then one pill daily until next follow up Lab work today Follow up in 2 weeks with chest xray

## 2013-03-23 NOTE — Assessment & Plan Note (Signed)
She has recurrence of pleuritic chest pain, and persistent left pleural effusion.  I don't think the effusion is large enough to warrant thoracentesis at this time.    Will resume prednisone.  She is to use 20 mg daily for 1 week, and then 10 mg daily.  Will follow up in 2 weeks with repeat chest xray.  Will also check her ESR, ANA, RF, and anti-CCP.

## 2013-03-24 ENCOUNTER — Telehealth: Payer: Self-pay | Admitting: Pulmonary Disease

## 2013-03-24 LAB — CYCLIC CITRUL PEPTIDE ANTIBODY, IGG: Cyclic Citrullin Peptide Ab: <2 U/mL (ref 0.0–5.0)

## 2013-03-24 LAB — ANA W/REFLEX IF POSITIVE: Anti Nuclear Antibody(ANA): NEGATIVE

## 2013-03-24 LAB — RHEUMATOID FACTOR: Rhuematoid fact SerPl-aCnc: 41 IU/mL — ABNORMAL HIGH (ref <=14)

## 2013-03-24 NOTE — Telephone Encounter (Signed)
  Lab Results  Component Value Date   RF 41* 03/23/2013   Lab Results  Component Value Date   ESRSEDRATE 81* 03/23/2013   Anti CCP < 2   ANA pending.  Results d/w pt.  She is feeling much better after doses of prednisone.    No change to current treatment plan from 03/23/13.

## 2013-03-27 ENCOUNTER — Telehealth: Payer: Self-pay | Admitting: Pulmonary Disease

## 2013-03-27 NOTE — Telephone Encounter (Signed)
Lab Results  Component Value Date   ANA Negative 03/23/2013    Will have my nurse inform pt that ANA was negative.  No change to current treatment plan.

## 2013-03-28 NOTE — Telephone Encounter (Signed)
Pt send results via mychart

## 2013-03-30 ENCOUNTER — Telehealth: Payer: Self-pay | Admitting: Pulmonary Disease

## 2013-03-30 NOTE — Telephone Encounter (Signed)
Pt aware. Nothing further was needed 

## 2013-03-30 NOTE — Telephone Encounter (Signed)
Please tell her to increase prednisone again to 20 mg per day, and continue this dose until her ROV on 04/05/13.

## 2013-03-30 NOTE — Telephone Encounter (Signed)
ATC pt at work #. Line rang several times with no response. wcb

## 2013-03-30 NOTE — Telephone Encounter (Signed)
I spoke with pt and she stated last night with "knife like pain". She stated she took some vicodin and it helped relief the pain since she had to come to work today. She is wanting to know if she needs to go back to taking ibuprofen or re adjust the prednisone. Per pt she was doing well. Please advise VS thanks

## 2013-04-05 ENCOUNTER — Ambulatory Visit (INDEPENDENT_AMBULATORY_CARE_PROVIDER_SITE_OTHER): Payer: 59 | Admitting: Pulmonary Disease

## 2013-04-05 ENCOUNTER — Encounter: Payer: Self-pay | Admitting: Pulmonary Disease

## 2013-04-05 ENCOUNTER — Ambulatory Visit (INDEPENDENT_AMBULATORY_CARE_PROVIDER_SITE_OTHER)
Admission: RE | Admit: 2013-04-05 | Discharge: 2013-04-05 | Disposition: A | Discharge status: Home / Self Care | Payer: 59 | Source: Ambulatory Visit | Attending: Pulmonary Disease | Admitting: Pulmonary Disease

## 2013-04-05 VITALS — BP 112/70 | HR 78 | Ht 64.0 in | Wt 154.0 lb

## 2013-04-05 DIAGNOSIS — J90 Pleural effusion, not elsewhere classified: Secondary | ICD-10-CM

## 2013-04-05 DIAGNOSIS — M069 Rheumatoid arthritis, unspecified: Secondary | ICD-10-CM

## 2013-04-05 MED ORDER — PREDNISONE 5 MG PO TABS: ORAL_TABLET | ORAL | Status: DC

## 2013-04-05 NOTE — Patient Instructions (Signed)
Prednisone 5 mg pill >> 3 pills daily for 2 weeks, then 2 pills daily for 2 weeks, then 1 pill daily until next visit Follow up in 6 weeks

## 2013-04-05 NOTE — Progress Notes (Signed)
Chief Complaint  Patient presents with  . 4 Week Follow Up    Pt is here to follow up on pleural effusion, had CXR done.    History of Present Illness: Pamela Ingram is a 62 y.o. female with left pleuritic chest pain and small effusion in setting of rheumatoid arthritis.  She is feeling much better.  She no longer has pain in her left side.  She is back to full activity w/o difficulties.  She denies fever, sweats, rashes, dyspnea, sputum, or joint swelling.  TESTS: CT chest 03/13/13 >> small Lt pleural effusion with pleural thickening. Labs 03/23/13 >> RF 41, anti CCP < 2, ANA negative, ESR 81  Marjean Imperato  has a past medical history of Anxiety; Hyperlipidemia; Thyroid disease; and Rheumatoid arthritis(714.0).  [place] Basham  has past surgical history that includes Vaginal hysterectomy (1995); Tubal ligation;  laparoscospy; and Colonoscopy.  Prior to Admission medications   Medication Sig Start Date End Date Taking? Authorizing Provider  ALPRAZolam Prudy Feeler) 0.5 MG tablet Take 1 tablet (0.5 mg total) by mouth daily as needed for sleep. 04/05/12  Yes Sanjuana Letters, MD  Cholecalciferol (VITAMIN D PO) Take 1 tablet by mouth daily.   Yes Historical Provider, MD  cyclobenzaprine (FLEXERIL) 10 MG tablet Take 10 mg by mouth at bedtime as needed for muscle spasms.   Yes Historical Provider, MD  DULoxetine (CYMBALTA) 60 MG capsule Take 1 capsule (60 mg total) by mouth daily. 04/05/12  Yes Sanjuana Letters, MD  folic acid (FOLVITE) 1 MG tablet Take 1 mg by mouth daily.   Yes Historical Provider, MD  Ibuprofen 200 MG CAPS Tapering off 03/14/13  Yes Coralyn Helling, MD  levothyroxine (SYNTHROID, LEVOTHROID) 50 MCG tablet Take 1 tablet (50 mcg total) by mouth daily. 04/05/12  Yes Sanjuana Letters, MD  methotrexate (RHEUMATREX) 2.5 MG tablet Take 8 tablets (20 mg total) by mouth once a week. Caution:Chemotherapy. Protect from light. Take once a week on saturdays 04/05/12  Yes Sanjuana Letters, MD   simvastatin (ZOCOR) 80 MG tablet Take 1 tablet (80 mg total) by mouth at bedtime. 04/05/12  Yes Sanjuana Letters, MD    Allergies  Allergen Reactions  . Amoxicillin Other (See Comments)    Pt gets yeast infection     Physical Exam:  General - No distress ENT - No sinus tenderness, no oral exudate, no LAN Cardiac - s1s2 regular, no murmur Chest - decreased respiratory excursion with left sided splinting with deep respiration, no wheeze/rales Back - No focal tenderness Abd - Soft, non-tender Ext - No edema Neuro - Normal strength Skin - No rashes Psych - normal mood, and behavior  Dg Chest 2 View  04/05/2013   *RADIOLOGY REPORT*  Clinical Data: Follow up pleurisy.  Former smoker.  CHEST - 2 VIEW  Comparison: 03/23/2013  Findings: The heart size and mediastinal contours are within normal limits.  Both lungs are clear.  The visualized skeletal structures are unremarkable.  IMPRESSION: Negative examination.   Original Report Authenticated By: Signa Kell, M.D.    Assessment/Plan:  Coralyn Helling, MD [place] [facility]/Critical Care/Sleep Pager:  [phone]

## 2013-04-06 ENCOUNTER — Other Ambulatory Visit (INDEPENDENT_AMBULATORY_CARE_PROVIDER_SITE_OTHER): Payer: 59

## 2013-04-06 ENCOUNTER — Telehealth: Payer: Self-pay | Admitting: Pulmonary Disease

## 2013-04-06 ENCOUNTER — Encounter: Payer: Self-pay | Admitting: Pulmonary Disease

## 2013-04-06 ENCOUNTER — Ambulatory Visit (INDEPENDENT_AMBULATORY_CARE_PROVIDER_SITE_OTHER): Payer: 59 | Admitting: Pulmonary Disease

## 2013-04-06 ENCOUNTER — Ambulatory Visit (HOSPITAL_COMMUNITY)
Admission: RE | Admit: 2013-04-06 | Discharge: 2013-04-06 | Disposition: A | Discharge status: Home / Self Care | Payer: 59 | Source: Ambulatory Visit | Attending: Pulmonary Disease | Admitting: Pulmonary Disease

## 2013-04-06 ENCOUNTER — Encounter (HOSPITAL_COMMUNITY): Payer: Self-pay

## 2013-04-06 ENCOUNTER — Ambulatory Visit (INDEPENDENT_AMBULATORY_CARE_PROVIDER_SITE_OTHER)
Admission: RE | Admit: 2013-04-06 | Discharge: 2013-04-06 | Disposition: A | Discharge status: Home / Self Care | Payer: 59 | Source: Ambulatory Visit | Attending: Pulmonary Disease | Admitting: Pulmonary Disease

## 2013-04-06 VITALS — BP 130/72 | HR 98 | Temp 98.1°F | Ht 64.0 in | Wt 154.0 lb

## 2013-04-06 DIAGNOSIS — J90 Pleural effusion, not elsewhere classified: Secondary | ICD-10-CM | POA: Insufficient documentation

## 2013-04-06 DIAGNOSIS — R079 Chest pain, unspecified: Secondary | ICD-10-CM

## 2013-04-06 DIAGNOSIS — R071 Chest pain on breathing: Secondary | ICD-10-CM | POA: Insufficient documentation

## 2013-04-06 DIAGNOSIS — M069 Rheumatoid arthritis, unspecified: Secondary | ICD-10-CM | POA: Insufficient documentation

## 2013-04-06 LAB — COMPREHENSIVE METABOLIC PANEL
ALT: 15 U/L (ref 0–35)
AST: 16 U/L (ref 0–37)
Albumin: 3.6 g/dL (ref 3.5–5.2)
Alkaline Phosphatase: 69 U/L (ref 39–117)
BUN: 14 mg/dL (ref 6–23)
CO2: 35 mEq/L — ABNORMAL HIGH (ref 19–32)
Calcium: 9.3 mg/dL (ref 8.4–10.5)
Chloride: 101 mEq/L (ref 96–112)
Creatinine, Ser: 0.8 mg/dL (ref 0.4–1.2)
GFR: 82.31 mL/min (ref >60.00)
Glucose, Bld: 118 mg/dL — ABNORMAL HIGH (ref 70–99)
Potassium: 4.4 mEq/L (ref 3.5–5.1)
Sodium: 139 mEq/L (ref 135–145)
Total Bilirubin: 0.5 mg/dL (ref 0.3–1.2)
Total Protein: 7.1 g/dL (ref 6.0–8.3)

## 2013-04-06 LAB — CBC WITH DIFFERENTIAL/PLATELET
Basophils Absolute: 0.1 10*3/uL (ref 0.0–0.1)
Basophils Relative: 0.4 % (ref 0.0–3.0)
Eosinophils Absolute: 0 10*3/uL (ref 0.0–0.7)
Eosinophils Relative: 0 % (ref 0.0–5.0)
HCT: 38 % (ref 36.0–46.0)
Hemoglobin: 12.5 g/dL (ref 12.0–15.0)
Lymphocytes Relative: 9.9 % — ABNORMAL LOW (ref 12.0–46.0)
Lymphs Abs: 1.2 10*3/uL (ref 0.7–4.0)
MCHC: 32.9 g/dL (ref 30.0–36.0)
MCV: 92.8 fl (ref 78.0–100.0)
Monocytes Absolute: 0.7 10*3/uL (ref 0.1–1.0)
Monocytes Relative: 5.3 % (ref 3.0–12.0)
Neutro Abs: 10.5 10*3/uL — ABNORMAL HIGH (ref 1.4–7.7)
Neutrophils Relative %: 84.4 % — ABNORMAL HIGH (ref 43.0–77.0)
Platelets: 431 10*3/uL — ABNORMAL HIGH (ref 150.0–400.0)
RBC: 4.1 Mil/uL (ref 3.87–5.11)
RDW: 14.1 % (ref 11.5–14.6)
WBC: 12.4 10*3/uL — ABNORMAL HIGH (ref 4.5–10.5)

## 2013-04-06 MED ORDER — IOHEXOL 350 MG/ML SOLN
100.0000 mL | Freq: Once | INTRAVENOUS | Status: AC | PRN
  Administered 2013-04-06: 100 mL via INTRAVENOUS

## 2013-04-06 NOTE — Assessment & Plan Note (Signed)
She is to follow up with rheumatology to discuss further options for therapy.  She may switch to rheumatologist in [place] to avoid need for travel to [facility].

## 2013-04-06 NOTE — Assessment & Plan Note (Signed)
She has dramatic change in symptoms and xray findings in less than 24 hours.  She reports taking MTX 2 days ago.  She had similar reaction 2 days after she took MTX last week.  It is possible she is having reaction to MTX causing effusion and pleuritis.  I have advised her to stop MTX.  She is to increase prednisone to 40 mg daily for now.  Will repeat CMET and CBC.  Will also arrange for CT angiogram chest to assess pleural effusion and to exclude pulmonary embolism (although clinical suspicion for PE is low).

## 2013-04-06 NOTE — Telephone Encounter (Signed)
Spoke with the pt She states starting last night having CP and pain in her rib area Pain worse with inspiration  Pt in tears due to pain OV with VS at 4 pm today, okayed per VS

## 2013-04-06 NOTE — Telephone Encounter (Signed)
Dg Chest 2 View  04/06/2013   *RADIOLOGY REPORT*  Clinical Data: Left-sided chest pain.  CHEST - 2 VIEW  Comparison: 04/05/2013.  Findings: Trachea is midline.  Heart size normal.  There is new air space disease at the left lung base with a small left pleural effusion.  Right lung is clear.  IMPRESSION: New left basilar airspace disease may be due to pneumonia, with a small left pleural effusion.  Follow-up to clearing is recommended.   Original Report Authenticated By: Leanna Battles, M.D.   Dg Chest 2 View  04/05/2013   *RADIOLOGY REPORT*  Clinical Data: Follow up pleurisy.  Former smoker.  CHEST - 2 VIEW  Comparison: 03/23/2013  Findings: The heart size and mediastinal contours are within normal limits.  Both lungs are clear.  The visualized skeletal structures are unremarkable.  IMPRESSION: Negative examination.   Original Report Authenticated By: Signa Kell, M.D.   Ct Angio Chest W/cm &/or Wo Cm  04/06/2013   *RADIOLOGY REPORT*  Clinical Data: History of rheumatoid arthritis with recurrent left pleural effusion.  Symptoms of pleuritic chest pain.  CT ANGIOGRAPHY CHEST  Technique:  Multidetector CT imaging of the chest using the standard protocol during bolus administration of intravenous contrast. Multiplanar reconstructed images including MIPs were obtained and reviewed to evaluate the vascular anatomy.  Contrast: OMNIPAQUE IOHEXOL 350 MG/ML SOLN  Comparison: 03/13/2013  Findings: The pulmonary arteries are adequately opacified.  There is no evidence of pulmonary embolism.  A small left pleural effusion is identified.  Pleural fluid volume is slightly increased since the prior CT.  There also is increased and atelectasis and airspace disease within the left lower lobe compared to the prior CT exam.  No abnormal pleural enhancement or nodularity is identified.  No pulmonary nodules or enlarged lymph nodes are seen.  Heart size is normal.  Thoracic aorta is of normal caliber.  No pericardial  fluid is seen.  Bony structures are unremarkable.  IMPRESSION:  1.  No evidence of acute pulmonary embolism. 2.  Slight increase in volume of a small left pleural effusion since the prior CTA. 3.  Increased prominence of atelectasis and airspace disease in the left lower lobe compared to the prior CT.  No findings to suggest malignancy or empyema.   Original Report Authenticated By: Irish Lack, M.D.   [facility]     Component Value Date/Time   NA 139 04/06/2013 1704   K 4.4 04/06/2013 1704   CL 101 04/06/2013 1704   CO2 35* 04/06/2013 1704   GLUCOSE 118* 04/06/2013 1704   BUN 14 04/06/2013 1704   CREATININE 0.8 04/06/2013 1704   CALCIUM 9.3 04/06/2013 1704   PROT 7.1 04/06/2013 1704   ALBUMIN 3.6 04/06/2013 1704   AST 16 04/06/2013 1704   ALT 15 04/06/2013 1704   ALKPHOS 69 04/06/2013 1704   BILITOT 0.5 04/06/2013 1704   GFRNONAA >60 07/13/2009 0531   GFRAA  Value: >60        The eGFR has been calculated using the MDRD equation. This calculation has not been validated in all clinical situations. eGFR's persistently <60 mL/min signify possible Chronic Kidney Disease. 07/13/2009 0531    CBC    Component Value Date/Time   WBC 12.4* 04/06/2013 1704   RBC 4.10 04/06/2013 1704   HGB 12.5 04/06/2013 1704   HCT 38.0 04/06/2013 1704   PLT 431.0* 04/06/2013 1704   MCV 92.8 04/06/2013 1704   MCHC 32.9 04/06/2013 1704   RDW 14.1 04/06/2013 1704  LYMPHSABS 1.2 04/06/2013 1704   MONOABS 0.7 04/06/2013 1704   EOSABS 0.0 04/06/2013 1704   BASOSABS 0.1 04/06/2013 1704    Left message detailing lab and CT chest results.  Asked pt to return call to office to discuss results in more detail.

## 2013-04-06 NOTE — Assessment & Plan Note (Signed)
This is in setting of history of rheumatoid arthritis.  She had increase in ESR, and mild increase in RF.  She has clinical and radiographic improvement with prednisone therapy.  Will try to gradually wean off prednisone as tolerated.  Defer further chest imaging studies unless she has recurrent symptoms.

## 2013-04-06 NOTE — Addendum Note (Signed)
Addended by: Caryl Ada on: 04/06/2013 05:03 PM   Modules accepted: Orders

## 2013-04-06 NOTE — Patient Instructions (Signed)
Will schedule CT chest Lab test today Take prednisone 40 mg daily until test results available Follow up in 6 weeks

## 2013-04-06 NOTE — Telephone Encounter (Signed)
ATC PT NA line rang several times and no one picked up. WCB

## 2013-04-06 NOTE — Telephone Encounter (Signed)
ATC her work phone and NA, unable to leave msg at her ext [place] home number and have left a msg with her spouse to return our call

## 2013-04-06 NOTE — Telephone Encounter (Signed)
Pt returned call and asked to be called back @ [phone] after 2:30 today. Pamela Ingram

## 2013-04-06 NOTE — Telephone Encounter (Signed)
Called # provided below by Irving Burton - [email or web address] for pt

## 2013-04-06 NOTE — Progress Notes (Signed)
Chief Complaint  Patient presents with  . Acute Visit    Reports severe chest pains when breathing in. Was seen yesterday by Dr. Craige Cotta but did not have these symptoms.    History of Present Illness: Pamela Ingram is a 62 y.o. female with left pleuritic chest pain and small effusion in setting of rheumatoid arthritis.  She returns today after developed recurrence of severe, left sided chest pain since last night.  She is not having cough, sputum, or fever.  She took 20 mg prednisone yesterday and today.  She also took ibuprofen.  She denies any chest trauma.  She did take MTX two days ago.  She had a similar reaction of recurrent chest pain last week that occurred two days after taking MTX.  TESTS: CT chest 03/13/13 >> small Lt pleural effusion with pleural thickening. Labs 03/23/13 >> RF 41, anti CCP < 2, ANA negative, ESR 81  Pamela Ingram  has a past medical history of Anxiety; Hyperlipidemia; Thyroid disease; and Rheumatoid arthritis(714.0).  Pamela Ingram  has past surgical history that includes Vaginal hysterectomy (1995); Tubal ligation;  laparoscospy; and Colonoscopy.  Prior to Admission medications   Medication Sig Start Date End Date Taking? Authorizing Provider  ALPRAZolam Prudy Feeler) 0.5 MG tablet Take 1 tablet (0.5 mg total) by mouth daily as needed for sleep. 04/05/12  Yes Sanjuana Letters, MD  Cholecalciferol (VITAMIN D PO) Take 1 tablet by mouth daily.   Yes Historical Provider, MD  cyclobenzaprine (FLEXERIL) 10 MG tablet Take 10 mg by mouth at bedtime as needed for muscle spasms.   Yes Historical Provider, MD  DULoxetine (CYMBALTA) 60 MG capsule Take 1 capsule (60 mg total) by mouth daily. 04/05/12  Yes Sanjuana Letters, MD  folic acid (FOLVITE) 1 MG tablet Take 1 mg by mouth daily.   Yes Historical Provider, MD  Ibuprofen 200 MG CAPS Tapering off 03/14/13  Yes Coralyn Helling, MD  levothyroxine (SYNTHROID, LEVOTHROID) 50 MCG tablet Take 1 tablet (50 mcg total) by mouth daily. 04/05/12   Yes Sanjuana Letters, MD  methotrexate (RHEUMATREX) 2.5 MG tablet Take 8 tablets (20 mg total) by mouth once a week. Caution:Chemotherapy. Protect from light. Take once a week on saturdays 04/05/12  Yes Sanjuana Letters, MD  simvastatin (ZOCOR) 80 MG tablet Take 1 tablet (80 mg total) by mouth at bedtime. 04/05/12  Yes Sanjuana Letters, MD    Allergies  Allergen Reactions  . Amoxicillin Other (See Comments)    Pt gets yeast infection     Physical Exam:  General - No distress ENT - No sinus tenderness, no oral exudate, no LAN Cardiac - s1s2 regular, no murmur Chest - decreased respiratory excursion with left sided splinting with deep respiration, no wheeze/rales Back - No focal tenderness Abd - Soft, non-tender Ext - No edema Neuro - Normal strength Skin - No rashes Psych - normal mood, and behavior  Dg Chest 2 View  04/06/2013   *RADIOLOGY REPORT*  Clinical Data: Left-sided chest pain.  CHEST - 2 VIEW  Comparison: 04/05/2013.  Findings: Trachea is midline.  Heart size normal.  There is new air space disease at the left lung base with a small left pleural effusion.  Right lung is clear.  IMPRESSION: New left basilar airspace disease may be due to pneumonia, with a small left pleural effusion.  Follow-up to clearing is recommended.   Original Report Authenticated By: Pamela Ingram, M.D.   Dg Chest 2 View  04/05/2013   *RADIOLOGY  REPORT*  Clinical Data: Follow up pleurisy.  Former smoker.  CHEST - 2 VIEW  Comparison: 03/23/2013  Findings: The heart size and mediastinal contours are within normal limits.  Both lungs are clear.  The visualized skeletal structures are unremarkable.  IMPRESSION: Negative examination.   Original Report Authenticated By: Pamela Ingram, M.D.    Assessment/Plan:  Coralyn Helling, MD [facility] Pager:  [phone]

## 2013-04-07 ENCOUNTER — Telehealth: Payer: Self-pay | Admitting: Pulmonary Disease

## 2013-04-07 NOTE — Telephone Encounter (Signed)
Okay to take motrin as needed.  She should continue prednisone 40 mg daily through the weekend, and call back to office on Monday to update status >> will then decide if/when to start weaning prednisone again.  Please emphasis the concern for her symptoms may be related to reaction to methotrexate, and she should not resume methotrexate.

## 2013-04-07 NOTE — Telephone Encounter (Signed)
I spoke with pt and she stated she had a left message from VS on her VM. She stated she is feeling some better today. She took pred 40 mg this morning. She still can feel some pain on left side. She is wanting to know if VS would approve for her to take ibuprofen 800 MG to help with the pain. Please advise Dr. Craige Cotta thanks

## 2013-04-07 NOTE — Telephone Encounter (Signed)
I called and made pt aware of recs. She voiced her understanding and needed nothing further 

## 2013-04-10 ENCOUNTER — Encounter: Payer: Self-pay | Admitting: Family Medicine

## 2013-04-10 ENCOUNTER — Ambulatory Visit (INDEPENDENT_AMBULATORY_CARE_PROVIDER_SITE_OTHER): Payer: 59 | Admitting: Family Medicine

## 2013-04-10 ENCOUNTER — Telehealth: Payer: Self-pay | Admitting: Pulmonary Disease

## 2013-04-10 VITALS — Ht 64.0 in | Wt 156.5 lb

## 2013-04-10 DIAGNOSIS — J90 Pleural effusion, not elsewhere classified: Secondary | ICD-10-CM

## 2013-04-10 DIAGNOSIS — E785 Hyperlipidemia, unspecified: Secondary | ICD-10-CM

## 2013-04-10 DIAGNOSIS — E663 Overweight: Secondary | ICD-10-CM

## 2013-04-10 DIAGNOSIS — Z6825 Body mass index (BMI) 25.0-25.9, adult: Secondary | ICD-10-CM

## 2013-04-10 NOTE — Progress Notes (Signed)
Medical Nutrition Therapy:  Appt start time: 1100 end time:  1200.  Assessment:  Primary concerns today: Weight management.  Pamela Ingram is on 40 mg prednisone for a lung condition, which has been challenging for weight loss.  She has been walking 30 min 3-4 X wk.  She has been feeling very hot since staring the prednisone, which makes exercise more difficult.  On workdays, Pamela Ingram is bringing dinner leftovers for lunch, and she is getting veg's 2 X day usually.  She has been getting more sleep.  There have been a couple of occasions when Pamela Ingram has consumed sweets in relatively large quantity, but she has generally limited sweets to yogurt and graham crackers, which she finds very satisfying.    24-hr recall suggests intake of ~1810 kcal:  (UP at 7:30 AM) B (8 AM)-  Oatmeal (3/4 c dry), 2 T raisins, 2 T walnuts, coffee w/ powdered 2 T creamer  480 Snk ( AM)-  none L (1:30 PM)-  Large salad, 3-oz hamburger patty, carrot, fat-free Greek yogurt, 2 full graham crackers  600 Snk ( PM)-  none D (6 PM)-  1/2 of [employer] Chx [place] (spin, chx, caneloni), salad, 2 coffee w/ ~3 T cream 730 Snk ( PM)-  none  Progress Towards Goal(s):  In progress.   Nutritional Diagnosis:  NB-2.1 Physical inactivity As related to nutritional intake.  As evidenced by exercise limited to 30-min walk 3-4 X wk. -3.3 Overweight/obesity As related to positive energy balance.  As evidenced by BMI >25.    Intervention:  Nutrition education.  Monitoring/Evaluation:  Dietary intake, exercise, and body weight in 1 month(s).

## 2013-04-10 NOTE — Telephone Encounter (Signed)
Please have her continue prednisone 40 mg daily.  She needs to come in for chest xray on Wednesday 04/12/13 >> I will call her with results and discuss plan for prednisone.  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

## 2013-04-10 NOTE — Telephone Encounter (Signed)
Pt aware of recs. She voiced her understanding. Also aware of concern for methotrexate. She stated she fully understands.

## 2013-04-10 NOTE — Telephone Encounter (Signed)
I spoke with pt and she stated she went ahead and took pred 40 mg this AM. Overall she feels better. She is still having pain in the upper left chest wall and under right rib. She does not feel like she needs ibuprofen for the pain but she can feel the pain is there. Please advise Dr. Craige Cotta thanks

## 2013-04-10 NOTE — Patient Instructions (Addendum)
-   Think twice before making dessert foods or bringing them in the house.    - Continue yogurt and 2 graham crackers as bedtime snack or as dessert right after dinner.   - Use 1/2 c steel-cut oats:  Cook with cinnamon and extra water, then stir in 1/3 cup powdered milk and 1/4-1/3 cup All Bran; add berries and 2 tbsp nuts. - Rice and beans meal:  Limit rice to 1/2 cup; if you are using beans as the main protein source, you need at least a cup.  Add LOTS of vegetables as well.   - Include a lot of veg's at both lunch and dinner.   - Consider starch food at supper optional.   - One alcoholic drink a week is well within moderation and should be fine.   - Physical activity goal:  Walk (or other exercise) at least 30 min 5 X wk.    - Look into bicycle trainers to convert your road bike to inside use.

## 2013-04-12 ENCOUNTER — Telehealth: Payer: Self-pay | Admitting: Pulmonary Disease

## 2013-04-12 ENCOUNTER — Ambulatory Visit (INDEPENDENT_AMBULATORY_CARE_PROVIDER_SITE_OTHER)
Admission: RE | Admit: 2013-04-12 | Discharge: 2013-04-12 | Disposition: A | Discharge status: Home / Self Care | Payer: 59 | Source: Ambulatory Visit | Attending: Pulmonary Disease | Admitting: Pulmonary Disease

## 2013-04-12 DIAGNOSIS — J90 Pleural effusion, not elsewhere classified: Secondary | ICD-10-CM

## 2013-04-12 NOTE — Telephone Encounter (Signed)
Dg Chest 2 View  04/12/2013   *RADIOLOGY REPORT*  Clinical Data: Pleurisy  CHEST - 2 VIEW  Comparison: 04/06/2013  Findings: Cardiomediastinal silhouette is stable.  No acute infiltrate or pulmonary edema.  Trace left pleural effusion.  Bony thorax is unremarkable.  IMPRESSION: No acute infiltrate or pulmonary edema.  Trace left pleural effusion.   Original Report Authenticated By: Natasha Mead, M.D.   Will have my nurse inform pt that CXR looks better.  Pleural fluid resolved.  She can change prednisone to 30 mg daily for one week, then 20 mg daily for one week, then 10 mg daily until follow up visit on 05/17/13.  Please again emphasize that she should not take methotrexate.  She should call back if her chest pain recurs.

## 2013-04-12 NOTE — Telephone Encounter (Signed)
I spoke with pt and she is requesting her CXR results. I advised her will have to get message over to Dr. Craige Cotta. She stated she is feeling better. Please advise Dr. Craige Cotta thanks

## 2013-04-12 NOTE — Telephone Encounter (Signed)
I spoke with patient about results and she verbalized understanding and had no questions. Per pt she has done away with methotrexate. Nothing further was needed

## 2013-04-14 ENCOUNTER — Telehealth: Payer: Self-pay | Admitting: Pulmonary Disease

## 2013-04-14 MED ORDER — PREDNISONE 10 MG PO TABS: ORAL_TABLET | ORAL | Status: DC

## 2013-04-14 NOTE — Telephone Encounter (Signed)
Will have my nurse inform pt that CXR looks better. Pleural fluid resolved. She can change prednisone to 30 mg daily for one week, then 20 mg daily for one week, then 10 mg daily until follow up visit on 05/17/13. Please again emphasize that she should not take methotrexate. She should call back if her chest pain recurs   RX sent.

## 2013-04-17 ENCOUNTER — Telehealth: Payer: Self-pay | Admitting: Critical Care Medicine

## 2013-04-17 NOTE — Telephone Encounter (Signed)
[facility] called.  Pharmacy confused on pred dose  Should be 10mg  not 5mg  and is 30mg /d and taper  See phone message from 6/20  Shan Levans

## 2013-05-08 ENCOUNTER — Encounter: Payer: Self-pay | Admitting: Pulmonary Disease

## 2013-05-08 ENCOUNTER — Ambulatory Visit (INDEPENDENT_AMBULATORY_CARE_PROVIDER_SITE_OTHER)
Admission: RE | Admit: 2013-05-08 | Discharge: 2013-05-08 | Disposition: A | Discharge status: Home / Self Care | Payer: 59 | Source: Ambulatory Visit | Attending: Pulmonary Disease | Admitting: Pulmonary Disease

## 2013-05-08 ENCOUNTER — Ambulatory Visit (INDEPENDENT_AMBULATORY_CARE_PROVIDER_SITE_OTHER): Payer: 59 | Admitting: Pulmonary Disease

## 2013-05-08 ENCOUNTER — Telehealth: Payer: Self-pay | Admitting: Pulmonary Disease

## 2013-05-08 VITALS — BP 112/82 | HR 88 | Temp 97.9°F | Ht 64.0 in | Wt 160.0 lb

## 2013-05-08 DIAGNOSIS — R071 Chest pain on breathing: Secondary | ICD-10-CM

## 2013-05-08 DIAGNOSIS — R0781 Pleurodynia: Secondary | ICD-10-CM

## 2013-05-08 DIAGNOSIS — J90 Pleural effusion, not elsewhere classified: Secondary | ICD-10-CM

## 2013-05-08 DIAGNOSIS — M069 Rheumatoid arthritis, unspecified: Secondary | ICD-10-CM

## 2013-05-08 MED ORDER — ALBUTEROL SULFATE HFA 108 (90 BASE) MCG/ACT IN AERS: 2.0000 | INHALATION_SPRAY | Freq: Four times a day (QID) | RESPIRATORY_TRACT | Status: DC | PRN

## 2013-05-08 MED ORDER — PREDNISONE 5 MG PO TABS: ORAL_TABLET | ORAL | Status: DC

## 2013-05-08 MED ORDER — DOXYCYCLINE HYCLATE 100 MG PO TABS: 100.0000 mg | ORAL_TABLET | Freq: Two times a day (BID) | ORAL | Status: DC

## 2013-05-08 NOTE — Telephone Encounter (Signed)
[id number] x1 

## 2013-05-08 NOTE — Progress Notes (Signed)
Chief Complaint  Patient presents with  . Acute Visit    Reports chest tightness on the R side x1 day. Denies SOB or coughing. Took 4 ibprofen and 1 vicodin, pain did not subside.    History of Present Illness: Pamela Ingram is a 62 y.o. 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  She developed pain on her right side last night.  She couldn't sleep.  She tried vicodin and ibuprofen, but neither of these helped.  She then took 5 mg prednisone, and started feeling better.  She is not having pain at present.  She denies cough, sputum, fever, or wheezing.  She is not having skin rash, sinus congestion, sore throat, or gland swelling.  She is no longer taking methotrexate.  She has noticed her joints hurting more with lower dose of prednisone.  She is due to see Dr. Azzie Roup for rheumatology in August.   TESTS: CT chest 03/13/13 >> small Lt pleural effusion with pleural thickening. Labs 03/23/13 >> RF 41, anti CCP < 2, ANA negative, ESR 81  Pamela Ingram  has a past medical history of Anxiety; Hyperlipidemia; Thyroid disease; and Rheumatoid arthritis(714.0).  Pamela Ingram  has past surgical history that includes Vaginal hysterectomy (1995); Tubal ligation;  laparoscospy; and Colonoscopy.  Prior to Admission medications   Medication Sig Start Date End Date Taking? Authorizing Provider  ALPRAZolam Prudy Feeler) 0.5 MG tablet Take 1 tablet (0.5 mg total) by mouth daily as needed for sleep. 04/05/12  Yes Sanjuana Letters, MD  Cholecalciferol (VITAMIN D PO) Take 1 tablet by mouth daily.   Yes Historical Provider, MD  cyclobenzaprine (FLEXERIL) 10 MG tablet Take 10 mg by mouth at bedtime as needed for muscle spasms.   Yes Historical Provider, MD  DULoxetine (CYMBALTA) 60 MG capsule Take 1 capsule (60 mg total) by mouth daily. 04/05/12  Yes Sanjuana Letters, MD  folic  acid (FOLVITE) 1 MG tablet Take 1 mg by mouth daily.   Yes Historical Provider, MD  Ibuprofen 200 MG CAPS Tapering off 03/14/13  Yes Coralyn Helling, MD  levothyroxine (SYNTHROID, LEVOTHROID) 50 MCG tablet Take 1 tablet (50 mcg total) by mouth daily. 04/05/12  Yes Sanjuana Letters, MD  methotrexate (RHEUMATREX) 2.5 MG tablet Take 8 tablets (20 mg total) by mouth once a week. Caution:Chemotherapy. Protect from light. Take once a week on saturdays 04/05/12  Yes Sanjuana Letters, MD  simvastatin (ZOCOR) 80 MG tablet Take 1 tablet (80 mg total) by mouth at bedtime. 04/05/12  Yes Sanjuana Letters, MD    Allergies  Allergen Reactions  . Amoxicillin Other (See Comments)    Pt gets yeast infection     Physical Exam:  General - No distress ENT - No sinus tenderness, no oral exudate, no LAN Cardiac - s1s2 regular, no murmur Chest - scattered rhonchi Rt upper lung field, no wheeze/rales Back - No focal tenderness Abd - Soft, non-tender Ext - No edema Neuro - Normal strength Skin - No rashes Psych - normal mood, and behavior   Assessment/Plan:  Coralyn Helling, MD [facility]/Sleep Pager:  [phone]

## 2013-05-08 NOTE — Telephone Encounter (Signed)
Call reports received from [facility] at [facility] for patients CXR::  CXR mild patchiness, Right Middle Lobe shows new opacity cince chest CT done 04/06/13- this is nonspecific but might reflect developing PNA  I have informed Dr. Craige Cotta of this report, he is already aware.

## 2013-05-08 NOTE — Assessment & Plan Note (Signed)
She is schedule for evaluation with Dr. Azzie Roup in August.

## 2013-05-08 NOTE — Telephone Encounter (Signed)
Dg Chest 2 View  05/08/2013   *RADIOLOGY REPORT*  Clinical Data: 62 year old female acute right chest pain.  CHEST - 2 VIEW  Comparison: 04/12/2013 and earlier.  Findings: Stable lung volumes.  No pneumothorax, pleural effusion or edema.  Cardiac size and mediastinal contours are within normal limits.  Visualized tracheal air column is within normal limits. Mild patchy opacity in the right lower lung on the frontal view, favor affecting the middle lobe on the lateral.  No other acute or confluent pulmonary opacity. No acute osseous abnormality identified.  IMPRESSION: Mild patchy right middle lobe opacity is new since the chest CT on 04/06/2013.  This is nonspecific but might reflect developing pneumonia.   Original Report Authenticated By: Erskine Speed, M.D.    Results d/w pt.  She again has recurrence of right sided chest pain, and has taken additional dose of prednisone.  While she does not have other symptoms to suggest pneumonia, she has been on prednisone which could mask other symptoms.  She has allergy to PCN's.  Will send prescription for doxycycline 100 mg bid for 10 days.  Advised her to call back if her symptoms do not resolve or progress.

## 2013-05-08 NOTE — Assessment & Plan Note (Signed)
Her previous symptoms have been on the left.  Her current symptoms are on the right.  She does not have symptoms to suggest respiratory infection.  Her symptoms could be related to RA involvement in lungs.  I do not think she needs antibiotics at this time.  Will continue slowly weaning off prednisone as tolerated.  Will also give trial of inhaler medication with proair, and arrange for pulmonary function testing.

## 2013-05-08 NOTE — Assessment & Plan Note (Signed)
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  Since she has been off MTX her effusion and left side pain have resolved.

## 2013-05-08 NOTE — Telephone Encounter (Signed)
I spoke with the pt and she states that she has been doing well since last OV until last night she began to have a severe pain under her right breast at her ribs. She states it was a 10 out of 10 on pain scale. She states she took a vicodin and 4 ibuprofen without relief. She states she went to bed but could not sleep due to pain. She states she got up again during middle of night to take some more pain meds and almost passed out. Pt states she then got up at 4 am and took her dose of 5mg  prednisone and this helped the pain. Pt states she already had an appt today with her PCP and her doc told her she had diminished lung sounds in bases of both lungs as well as some wheezing. Pt states she has also been having some chest tightness as well. Appt set today with VS at 2:15. Carron Curie, CMA

## 2013-05-08 NOTE — Patient Instructions (Signed)
Proair two puffs up to four times per day as needed Will schedule breathing test (PFT) and call with results Prednisone 5 mg daily for one week, then 2.5 mg daily for one week, then stop prednisone  Follow up in 6 weeks

## 2013-05-09 ENCOUNTER — Ambulatory Visit: Payer: 59 | Admitting: Family Medicine

## 2013-05-10 ENCOUNTER — Telehealth: Payer: Self-pay | Admitting: Pulmonary Disease

## 2013-05-10 NOTE — Telephone Encounter (Signed)
Just because she has a fever, doesn't necessarily mean it is coming from her lungs.  Can by from many different sources.  Is she congested, is she coughing with discolored mucus??  Her note said something about an abx, but I don't see where we put her on an abx??

## 2013-05-10 NOTE — Telephone Encounter (Signed)
[id number].Kalese Ensz, CMA  

## 2013-05-10 NOTE — Telephone Encounter (Signed)
I spoke with pt. She stated she checked her temp and it was 100.2. Per pt she had already taken 4 ibuprofen at lunch and this was temp afterwards. She rechecked it again once she got home and it went down to 99.9. Pt stated she did feel feverish but also she was standing in the front yard for a few min talking to her neighbor and it was hot outside. Pt did take 20 mg pred today and still taking ABX. Pt is concerned bc she has never had this happen to her before and wants to know if she needs to be worried about anything. Dr. Craige Cotta is scheduled off this afternoon so will forward to doc of the day. Please advise KC thanks  Allergies  Allergen Reactions  . Amoxicillin Other (See Comments)    Pt gets yeast infection

## 2013-05-10 NOTE — Telephone Encounter (Signed)
Called, spoke with pt.  Informed her of below per Dr. Craige Cotta.  She verbalized understanding, reports she doesn't need pred rx at this time, and voiced no further questions or concerns at this time.

## 2013-05-10 NOTE — Telephone Encounter (Signed)
Called, spoke with pt.  Reports last night she started to have pain under right breast and above the right breast as well.  She was unable to lay flat d/t the pain.  Describes pain as a 6/10 when taking 5 mg prednisone.  Propped up on pillow which allowed her to go to sleep.  But she woke up again after this with the same pain.  Reports "this is exactly the same bad pain as I had on the left side."  She isn't getting any relief on the 5 mg of prednisone.  Because of this, she took 10 mg of pred this am.  She did report some increased SOB last night.  No cough.  Is on doxy.  Would like to know if she could increased prednisone to 20 mg for 3-4 days then go back to 10 mg daily.  Last OV with VS was on 05/08/13 with the following pt instructions:  Patient Instructions    Proair two puffs up to four times per day as needed  Will schedule breathing test (PFT) and call with results  Prednisone 5 mg daily for one week, then 2.5 mg daily for one week, then stop prednisone  Follow up in 6 weeks   CXR was also done during this visit. -------  Dr. Craige Cotta, pls advise.  Thank you.  [facility]  Allergies  Allergen Reactions  . Amoxicillin Other (See Comments)    Pt gets yeast infection

## 2013-05-10 NOTE — Telephone Encounter (Signed)
I spoke with pt. She denies any cough and no congestion. She stated she came home from work and rested a little her fever is now gone. She is "cool as can be". I advised pt will forward to Dr. Craige Cotta as an FYI she called

## 2013-05-10 NOTE — Telephone Encounter (Signed)
Pt returned call. Pamela Ingram  

## 2013-05-10 NOTE — Telephone Encounter (Signed)
Okay to increase prednisone to 20 mg daily until pain symptoms resolve, then decrease as tolerated to 5 mg daily.

## 2013-05-11 ENCOUNTER — Other Ambulatory Visit: Payer: Self-pay | Admitting: Critical Care Medicine

## 2013-05-11 NOTE — Telephone Encounter (Signed)
VS ok'ed increase in prednisone per phone msg from 05/10/13.  Pls see that phone msg for further information.

## 2013-05-12 ENCOUNTER — Telehealth: Payer: Self-pay | Admitting: Pulmonary Disease

## 2013-05-12 NOTE — Telephone Encounter (Signed)
I spoke with the pt and she states she spoke with Dr. Sung Amabile last night and he advised her to take 40mg  prednisone yesterday, and then take 20mg  today and call Dr. Craige Cotta. Pt wants to know how to proceed with prednisone. She wants to know should she take 40mg  for a few more days and then taper? Please advise on directions.  Also the pt has an appt on 06/23/13 for f/u but Dr. Sung Amabile rec she f/u in 2 weeks with CXR. Pt states she is feeling much better with abx and prednisone and wanted to know if Dr. Craige Cotta felt she should f/u in 2 weeks or is it ok to keep appt in august? Please advise. Carron Curie, CMA   Allergies  Allergen Reactions  . Amoxicillin Other (See Comments)    Pt gets yeast infection

## 2013-05-12 NOTE — Telephone Encounter (Signed)
Please tell her to take 40 mg prednisone daily for 4 days, then 30 mg daily for 4 days, and then change to 20 mg daily.  She should call back to update status when she gets to 20 mg daily of prednisone or if her symptoms get worse.

## 2013-05-12 NOTE — Telephone Encounter (Signed)
Pt advised. Blen Ransome, CMA  

## 2013-05-16 ENCOUNTER — Emergency Department (HOSPITAL_COMMUNITY)
Admission: EM | Admit: 2013-05-16 | Discharge: 2013-05-16 | Disposition: A | Discharge status: Home / Self Care | Payer: 59 | Attending: Emergency Medicine | Admitting: Emergency Medicine

## 2013-05-16 ENCOUNTER — Emergency Department (HOSPITAL_COMMUNITY): Payer: 59

## 2013-05-16 ENCOUNTER — Telehealth: Payer: Self-pay | Admitting: Pulmonary Disease

## 2013-05-16 ENCOUNTER — Encounter (HOSPITAL_COMMUNITY): Payer: Self-pay | Admitting: *Deleted

## 2013-05-16 DIAGNOSIS — E785 Hyperlipidemia, unspecified: Secondary | ICD-10-CM | POA: Insufficient documentation

## 2013-05-16 DIAGNOSIS — Z79899 Other long term (current) drug therapy: Secondary | ICD-10-CM | POA: Insufficient documentation

## 2013-05-16 DIAGNOSIS — Z87891 Personal history of nicotine dependence: Secondary | ICD-10-CM | POA: Insufficient documentation

## 2013-05-16 DIAGNOSIS — G8929 Other chronic pain: Secondary | ICD-10-CM | POA: Insufficient documentation

## 2013-05-16 DIAGNOSIS — R0602 Shortness of breath: Secondary | ICD-10-CM | POA: Insufficient documentation

## 2013-05-16 DIAGNOSIS — R071 Chest pain on breathing: Secondary | ICD-10-CM | POA: Insufficient documentation

## 2013-05-16 DIAGNOSIS — R079 Chest pain, unspecified: Secondary | ICD-10-CM

## 2013-05-16 DIAGNOSIS — Z8739 Personal history of other diseases of the musculoskeletal system and connective tissue: Secondary | ICD-10-CM | POA: Insufficient documentation

## 2013-05-16 DIAGNOSIS — E039 Hypothyroidism, unspecified: Secondary | ICD-10-CM | POA: Insufficient documentation

## 2013-05-16 DIAGNOSIS — IMO0002 Reserved for concepts with insufficient information to code with codable children: Secondary | ICD-10-CM | POA: Insufficient documentation

## 2013-05-16 DIAGNOSIS — F411 Generalized anxiety disorder: Secondary | ICD-10-CM | POA: Insufficient documentation

## 2013-05-16 LAB — CBC WITH DIFFERENTIAL/PLATELET
Basophils Absolute: 0 10*3/uL (ref 0.0–0.1)
Basophils Relative: 0 % (ref 0–1)
Eosinophils Absolute: 0 10*3/uL (ref 0.0–0.7)
Eosinophils Relative: 0 % (ref 0–5)
HCT: 38.1 % (ref 36.0–46.0)
Hemoglobin: 12.5 g/dL (ref 12.0–15.0)
Lymphocytes Relative: 10 % — ABNORMAL LOW (ref 12–46)
Lymphs Abs: 1.4 10*3/uL (ref 0.7–4.0)
MCH: 29.4 pg (ref 26.0–34.0)
MCHC: 32.8 g/dL (ref 30.0–36.0)
MCV: 89.6 fL (ref 78.0–100.0)
Monocytes Absolute: 0.8 10*3/uL (ref 0.1–1.0)
Monocytes Relative: 6 % (ref 3–12)
Neutro Abs: 11.5 10*3/uL — ABNORMAL HIGH (ref 1.7–7.7)
Neutrophils Relative %: 84 % — ABNORMAL HIGH (ref 43–77)
Platelets: 468 10*3/uL — ABNORMAL HIGH (ref 150–400)
RBC: 4.25 MIL/uL (ref 3.87–5.11)
RDW: 13.1 % (ref 11.5–15.5)
WBC: 13.7 10*3/uL — ABNORMAL HIGH (ref 4.0–10.5)

## 2013-05-16 LAB — COMPREHENSIVE METABOLIC PANEL
ALT: 10 U/L (ref 0–35)
AST: 16 U/L (ref 0–37)
Albumin: 3.6 g/dL (ref 3.5–5.2)
Alkaline Phosphatase: 72 U/L (ref 39–117)
BUN: 18 mg/dL (ref 6–23)
CO2: 28 mEq/L (ref 19–32)
Calcium: 9.9 mg/dL (ref 8.4–10.5)
Chloride: 95 mEq/L — ABNORMAL LOW (ref 96–112)
Creatinine, Ser: 0.69 mg/dL (ref 0.50–1.10)
GFR calc Af Amer: >90 mL/min (ref >90)
GFR calc non Af Amer: >90 mL/min (ref >90)
Glucose, Bld: 105 mg/dL — ABNORMAL HIGH (ref 70–99)
Potassium: 3.9 mEq/L (ref 3.5–5.1)
Sodium: 134 mEq/L — ABNORMAL LOW (ref 135–145)
Total Bilirubin: 0.3 mg/dL (ref 0.3–1.2)
Total Protein: 7.6 g/dL (ref 6.0–8.3)

## 2013-05-16 LAB — LIPASE, BLOOD: Lipase: 14 U/L (ref 11–59)

## 2013-05-16 LAB — POCT I-STAT TROPONIN I: Troponin i, poc: 0.01 ng/mL (ref 0.00–0.08)

## 2013-05-16 MED ORDER — METHYLPREDNISOLONE SODIUM SUCC 125 MG IJ SOLR: 125.0000 mg | Freq: Once | INTRAMUSCULAR | Status: DC

## 2013-05-16 NOTE — ED Provider Notes (Signed)
History    CSN: [id number] Arrival date & time 05/16/13  1909  First MD Initiated Contact with Patient 05/16/13 2005     Chief Complaint  Patient presents with  . Chest Pain   (Consider location/radiation/quality/duration/timing/severity/associated sxs/prior Treatment) HPI History provided by pt and prior chart.  Pt a former smoker w/ RA and recent pleural effusion, likely a delayed hypersensitivity reaction to methotrexate which she is no longer taking.  She presents to ED today w/ severe, sharp, non-radiating pain below the right breast w/ associated SOB since 5pm.  Had mild relief w/ 10mg  prendisone and a vicodin.  This pain has been recurrent for the past several months, but was isolated to L anterior chest until 2 weeks ago.  Has had four episodes over the past week.  No known instigating factors but following onset, it is aggravated by movement and laying flat.  The only thing that gives her relief is prednisone.  No associated fever, cough, wheezing, abd pain, N/V/D, LE edema.  Has stable LE arthralgias attributed to RA.  Was evaluated by her PCP multiple times, treated w/ abx w/out improvement, and then finally referred to pulmonology in 02/2013.  Most recent visit w/ Dr. Craige Cotta was 7/14, and per his note, he is suspicious that she has RA involvement of the lungs.  He started her on a prednisone taper, which improved symptoms, but episodes of pain recurred once she got down to 5mg /day.  She denies trauma and recent change in medications.  Has had two CT angio chest, most recently 03/13/13, which were negative for PE but showed a small Lt pleural effusion w/ pleural thickening.  She is scheduled to f/u with Dr. Craige Cotta again tomorrow but severity of pain today scared her.  Past Medical History  Diagnosis Date  . Anxiety   . Hyperlipidemia   . Thyroid disease     hypothyroidism  . Rheumatoid arthritis(714.0)    Past Surgical History  Procedure Laterality Date  . Vaginal hysterectomy  1995   . Tubal ligation    .  laparoscospy    . Colonoscopy     Family History  Problem Relation Age of Onset  . Colon cancer Neg Hx   . Rectal cancer Neg Hx   . Stomach cancer Neg Hx   . Esophageal cancer Neg Hx   . Emphysema Mother   . Rheum arthritis Mother    History  Substance Use Topics  . Smoking status: Former Smoker -- 0.50 packs/day for 44 years    Quit date: 10/26/2009  . Smokeless tobacco: Never Used     Comment: off and on x 44 years 03/14/13  . Alcohol Use: Yes     Comment: occasional   OB History   Grav Para Term Preterm Abortions TAB SAB Ect Mult Living                 Review of Systems  All other systems reviewed and are negative.    Allergies  Amoxicillin  Home Medications   Current Outpatient Rx  Name  Route  Sig  Dispense  Refill  . albuterol (PROAIR HFA) 108 (90 BASE) MCG/ACT inhaler   Inhalation   Inhale 2 puffs into the lungs every 6 (six) hours as needed for wheezing.   1 Inhaler   3   . ALPRAZolam (XANAX) 0.5 MG tablet   Oral   Take 0.5 mg by mouth 3 (three) times daily as needed for sleep or anxiety.          [name]  cetirizine (ZYRTEC) 10 MG tablet   Oral   Take 10 mg by mouth daily.         . Cholecalciferol (VITAMIN D PO)   Oral   Take 1 tablet by mouth daily.         [name] doxycycline (VIBRA-TABS) 100 MG tablet   Oral   Take 1 tablet (100 mg total) by mouth 2 (two) times daily.   20 tablet   0   . DULoxetine (CYMBALTA) 60 MG capsule   Oral   Take 60 mg by mouth every morning.          . fluticasone (FLONASE) 50 MCG/ACT nasal spray   Nasal   Place 2 sprays into the nose every morning.          [name] levothyroxine (SYNTHROID, LEVOTHROID) 50 MCG tablet   Oral   Take 1 tablet (50 mcg total) by mouth daily.         . predniSONE (DELTASONE) 10 MG tablet   Oral   Take 20-40 mg by mouth daily. Tapered dose: Take 40 mg for 4 days, 30 mg for 4 days, then 20 mg daily.         . simvastatin (ZOCOR) 20 MG tablet   Oral   Take  20 mg by mouth every evening.          BP 159/88  Pulse 89  Temp(Src) 98.8 F (37.1 C) (Oral)  Resp 18  SpO2 100% Physical Exam  Nursing note and vitals reviewed. Constitutional: She is oriented to person, place, and time. She appears well-developed and well-nourished. No distress.  HENT:  Head: Normocephalic and atraumatic.  Eyes:  Normal appearance  Neck: Normal range of motion.  Cardiovascular: Normal rate, regular rhythm and intact distal pulses.   Pulmonary/Chest: Effort normal and breath sounds normal. No respiratory distress. She exhibits no tenderness.  No pleuritic pain reported  Abdominal: Soft. Bowel sounds are normal. She exhibits no distension. There is no tenderness. There is no guarding.  Musculoskeletal: Normal range of motion.  No peripheral edema or calf tenderness  Neurological: She is alert and oriented to person, place, and time.  Skin: Skin is warm and dry. No rash noted.  Psychiatric: She has a normal mood and affect. Her behavior is normal.    ED Course  Procedures (including critical care time) Labs Reviewed  CBC WITH DIFFERENTIAL - Abnormal; Notable for the following:    WBC 13.7 (*)    Platelets 468 (*)    Neutrophils Relative % 84 (*)    Neutro Abs 11.5 (*)    Lymphocytes Relative 10 (*)    All other components within normal limits  COMPREHENSIVE METABOLIC PANEL - Abnormal; Notable for the following:    Sodium 134 (*)    Chloride 95 (*)    Glucose, Bld 105 (*)    All other components within normal limits  LIPASE, BLOOD  POCT I-STAT TROPONIN I   No results found. 1. Chronic chest pain     MDM  61yo F w/ RA and several months of recurrent pleuritic CP, presents w/ typical episode of increased severity.  Has been evaluated by pulmonology, most recent f/u on 7/14 and next tomorrow.  Has had two CTA chest in the past two months that have shown small L pleural effusion and pleural thickening only and is currently on doxy for likely incidental  finding of non-specific R middle lobe opacity on CXR 7/14.  The only thing that gives her  relief is prednisone and episodes recur when she completes a taper.  Dr. Craige Cotta believes her symptoms are secondary to a a delayed hypersensitivity reaction to methotrexate, which patient d/c'd several months ago, and/or pulmonary involvement of RA.  Her RA is currently untreated and she is on steroids prn.  On exam, afebrile, labs w/in nml range, NAD, nml breath sounds, pain is not reproducible, abd benign, no sign of DVT.  Labs and CXR unremarkable.  Pt refused EKG because she does not feel it is necessary and doesn't want to pay for it.  Discussed case w/ Dr. Blinda Leatherwood and he agrees that patient probably needs to be on chronic steroids.  He recommends a dose of IV solumedrol and then f/u w/ pulmonology as scheduled tomorrow.  Pt declines the solumedrol.  She has prednisone at home.  All results were discussed w/ her.  She is frustrated, particularly about treatment options, but appreciative of our care today.  Return precautions discussed. 9:59 PM   Otilio Miu, PA-C 05/16/13 2200

## 2013-05-16 NOTE — ED Notes (Signed)
Patient refused to have the EMT do an EKG on her. Patient stated that she is seeing a cardiac doctor for pleurisy and has already had several EKGs done and does not want another charge today for one. Patient stated that she is not here for her heart and she would understand doing an EKG if she was having shortness of breath. I informed patient that EKGs can change and patient still said she did not want an EKG done.

## 2013-05-16 NOTE — ED Notes (Signed)
Pt reports rt lower chest pain that radiates around to right flank that began approx 1700 today, pt admits to same pain that occurred yesterday evening as well. Pt has recently been worked up by pulmonologist for mid/left chest paint that began approx 6 weeks ago - pulmonologist told pt she had pleurisy at that time. Pt denies any n/v, lightheadedness, or recent fever. Pt declines EKG at present, states she "really does not feel like it is her heart," pt advised of reasons for acquiring EKG on pt w/ chest pain and pt continues to decline. Pt states she still has her gallbladder as well and is unsure if that is the cause.

## 2013-05-16 NOTE — Telephone Encounter (Signed)
I spoke with the pt and she states she has been taking prednisone 40mg  and at first was feeling better but then last night around 8pm she had a sharp stabbing pain under her right breast that continued to get worse through the night until she took an extra prednisone early in the AM. Pt is concerned as to why the pain continues. Appt set for tomorrow at 12. Carron Curie, CMA

## 2013-05-17 ENCOUNTER — Encounter: Payer: Self-pay | Admitting: Adult Health

## 2013-05-17 ENCOUNTER — Ambulatory Visit (INDEPENDENT_AMBULATORY_CARE_PROVIDER_SITE_OTHER): Payer: 59 | Admitting: Adult Health

## 2013-05-17 ENCOUNTER — Ambulatory Visit: Payer: 59 | Admitting: Pulmonary Disease

## 2013-05-17 VITALS — BP 128/74 | HR 76 | Temp 98.4°F | Ht 64.0 in | Wt 158.0 lb

## 2013-05-17 DIAGNOSIS — R071 Chest pain on breathing: Secondary | ICD-10-CM

## 2013-05-17 DIAGNOSIS — R0781 Pleurodynia: Secondary | ICD-10-CM

## 2013-05-17 MED ORDER — PREDNISONE 10 MG PO TABS: ORAL_TABLET | ORAL | Status: DC

## 2013-05-17 NOTE — Patient Instructions (Addendum)
Finish Doxycyline .  Taper prednisone 10mg  - 3 tabs for 3 days, 2 tabs for 3 days, then 1 tab daily -hold at this dose.  Alternate ice and heat to ribs As needed   Gas x with meals As needed   Please contact office for sooner follow up if symptoms do not improve or worsen or seek emergency care  Follow up Dr. Craige Cotta  As planned  Follow up with Dr. Dareen Piano with Rheumatology as planned

## 2013-05-17 NOTE — ED Provider Notes (Signed)
Medical screening examination/treatment/procedure(s) were performed by non-physician practitioner and as supervising physician I was immediately available for consultation/collaboration.   Gilda Crease, MD 05/17/13 [phone]

## 2013-05-18 NOTE — Progress Notes (Signed)
Subjective:    Patient ID: Pamela Ingram, female    DOB: [birth year], 62 y.o.   MRN: [id number]  HPI 62 yo female former smoker with hx of rheumatoid arthritis with recurrent left and right pleuritic chest pain and pleural effusion likely from delayed hypersensitivity reaction to methotrexate. She is due to see Dr. Azzie Roup for rheumatology in August.  TESTS: CT chest 03/13/13 >> small Lt pleural effusion with pleural thickening. Labs 03/23/13 >> RF 41, anti CCP < 2, ANA negative, ESR 81 CXR 05/16/13>NAD   05/17/13 ER follow up  Pt was seen in ER on 7/22 after developing sharp severe pain along the right rib cage. She has a hx of RA and recent pleural effusion, likely a delayed hypersensitivity reaction to methotrexate which she is no longer taking. She presented to ED w/ severe, sharp, non-radiating pain below the right breast w/ associated SOB since 5pm. Took 10mg  prendisone and a vicodin with minimal relief.  This pain has been recurrent for the past several months, but was isolated to L anterior chest until 2 weeks ago. Has had four episodes over the past week. No known instigating factors but following onset, it is aggravated by movement and laying flat. Prednisone bursts have helped her left sided pleuritic pain in past. However reports when she goes below 10mg  chest wall pain returns.   Has had two CT angio chest, most recently 03/13/13, which were negative for PE but showed a small Lt pleural effusion w/ pleural thickening. CXR done in ER showed NAD, no effusion or infiltrate. Was seen in office with Dr. Craige Cotta  On 7/14, cxr at that time showed ? Right basilar infiltrate , she was treated with doxycyline which she has 2 days left on. In ER she was started on prednsione burst and is currently on 30mg  daily . Pain has subsided and she feels good today. Has upcoming ov to establish with local rheumatologist . Previously followed by [facility] .  cardiac enzymes were unremarkable in ER.  Denies exertional  chest pain, hemoptysis, fever, rash, known injury, joint swelling, wheezing or cough.   Review of Systems Constitutional:   No  weight loss, night sweats,  Fevers, chills, + fatigue, or  lassitude.  HEENT:   No headaches,  Difficulty swallowing,  Tooth/dental problems, or  Sore throat,                No sneezing, itching, ear ache, nasal congestion, post nasal drip,   CV:  No   Orthopnea, PND, swelling in lower extremities, anasarca, dizziness, palpitations, syncope.   GI  No heartburn, indigestion, abdominal pain, nausea, vomiting, diarrhea, change in bowel habits, loss of appetite, bloody stools.   Resp:   No excess mucus, no productive cough,  No non-productive cough,  No coughing up of blood.  No change in color of mucus.  No wheezing.  No chest wall deformity  Skin: no rash or lesions.  GU: no dysuria, change in color of urine, no urgency or frequency.  No flank pain, no hematuria   MS:    No decreased range of motion.  No back pain.  Psych:  No change in mood or affect. No depression or anxiety.  No memory loss.         Objective:   Physical Exam GEN: A/Ox3; pleasant , NAD, well nourished   HEENT:  [place]/AT,  EACs-clear, TMs-wnl, NOSE-clear, THROAT-clear, no lesions, no postnasal drip or exudate noted.   NECK:  Supple w/ fair ROM; no  JVD; normal carotid impulses w/o bruits; no thyromegaly or nodules palpated; no lymphadenopathy.  RESP  Clear  P & A; w/o, wheezes/ rales/ or rhonchi.no accessory muscle use, no dullness to percussion nontender chest wall  CARD:  RRR, no m/r/g  , no peripheral edema, pulses intact, no cyanosis or clubbing.  GI:   Soft & nt; nml bowel sounds; no organomegaly or masses detected.  Musco: Warm bil, no deformities or joint swelling noted.   Neuro: alert, no focal deficits noted.    Skin: Warm, no lesions or rashes         Assessment & Plan:

## 2013-05-18 NOTE — Progress Notes (Signed)
Reviewed and agree with assessment/plan. 

## 2013-05-18 NOTE — Assessment & Plan Note (Addendum)
Migratory pleuritic chest wall pain ? Etiology -steroid responsive  CXR was clear with resolution of pleural effusion .  Cardiac enzymes neg.  Previous CT chest in past was neg for PE.  Suspect this is related to RA +/- muscle spasm/msk pain.  Will taper steroids to 10mg  and hold at this dose until seen by Rheumatolgy   Plan  Finish Doxycyline .  Taper prednisone 10mg  - 3 tabs for 3 days, 2 tabs for 3 days, then 1 tab daily -hold at this dose.  Alternate ice and heat to ribs As needed   Gas x with meals As needed   Please contact office for sooner follow up if symptoms do not improve or worsen or seek emergency care  Follow up Dr. Craige Cotta  As planned  Follow up with Dr. Dareen Piano with Rheumatology as planned

## 2013-05-19 ENCOUNTER — Ambulatory Visit (HOSPITAL_COMMUNITY)
Admission: RE | Admit: 2013-05-19 | Discharge: 2013-05-19 | Disposition: A | Discharge status: Home / Self Care | Payer: 59 | Source: Ambulatory Visit | Attending: Pulmonary Disease | Admitting: Pulmonary Disease

## 2013-05-19 DIAGNOSIS — R0789 Other chest pain: Secondary | ICD-10-CM | POA: Insufficient documentation

## 2013-05-19 DIAGNOSIS — R0781 Pleurodynia: Secondary | ICD-10-CM

## 2013-05-19 DIAGNOSIS — Z87891 Personal history of nicotine dependence: Secondary | ICD-10-CM | POA: Insufficient documentation

## 2013-05-19 LAB — PULMONARY FUNCTION TEST

## 2013-05-19 MED ORDER — ALBUTEROL SULFATE (5 MG/ML) 0.5% IN NEBU
2.5000 mg | INHALATION_SOLUTION | Freq: Once | RESPIRATORY_TRACT | Status: AC
  Administered 2013-05-19: 2.5 mg via RESPIRATORY_TRACT

## 2013-05-25 ENCOUNTER — Telehealth: Payer: Self-pay | Admitting: Pulmonary Disease

## 2013-05-25 NOTE — Telephone Encounter (Signed)
Pt is aware of PFT results. 

## 2013-05-25 NOTE — Telephone Encounter (Signed)
lmtcb

## 2013-05-25 NOTE — Telephone Encounter (Signed)
PFT 05/19/13 >> FEV1 2.32 (91%), FEV1% 82, TLC 4.94 (97%), DLCO 81%, no BD  Will have my nurse inform pt that PFT was normal.  Will discuss in more detail at [facility] on 06/23/13.

## 2013-06-01 ENCOUNTER — Other Ambulatory Visit (HOSPITAL_BASED_OUTPATIENT_CLINIC_OR_DEPARTMENT_OTHER): Payer: Self-pay | Admitting: Family Medicine

## 2013-06-01 DIAGNOSIS — Z1231 Encounter for screening mammogram for malignant neoplasm of breast: Secondary | ICD-10-CM

## 2013-06-19 ENCOUNTER — Inpatient Hospital Stay (HOSPITAL_BASED_OUTPATIENT_CLINIC_OR_DEPARTMENT_OTHER): Admission: RE | Admit: 2013-06-19 | Payer: 59 | Source: Ambulatory Visit

## 2013-06-23 ENCOUNTER — Ambulatory Visit (INDEPENDENT_AMBULATORY_CARE_PROVIDER_SITE_OTHER): Payer: 59 | Admitting: Pulmonary Disease

## 2013-06-23 ENCOUNTER — Encounter: Payer: Self-pay | Admitting: Pulmonary Disease

## 2013-06-23 ENCOUNTER — Ambulatory Visit (HOSPITAL_BASED_OUTPATIENT_CLINIC_OR_DEPARTMENT_OTHER)
Admission: RE | Admit: 2013-06-23 | Discharge: 2013-06-23 | Disposition: A | Discharge status: Home / Self Care | Payer: 59 | Source: Ambulatory Visit | Attending: Family Medicine | Admitting: Family Medicine

## 2013-06-23 VITALS — BP 112/74 | HR 76 | Ht 64.0 in | Wt 155.0 lb

## 2013-06-23 DIAGNOSIS — R0781 Pleurodynia: Secondary | ICD-10-CM

## 2013-06-23 DIAGNOSIS — Z1231 Encounter for screening mammogram for malignant neoplasm of breast: Secondary | ICD-10-CM | POA: Insufficient documentation

## 2013-06-23 DIAGNOSIS — M069 Rheumatoid arthritis, unspecified: Secondary | ICD-10-CM

## 2013-06-23 DIAGNOSIS — J90 Pleural effusion, not elsewhere classified: Secondary | ICD-10-CM

## 2013-06-23 DIAGNOSIS — R071 Chest pain on breathing: Secondary | ICD-10-CM

## 2013-06-23 NOTE — Patient Instructions (Signed)
Follow up in 3 months

## 2013-06-23 NOTE — Progress Notes (Signed)
Chief Complaint  Patient presents with  . Pleurisy    States that she is feeling much better. Denies any chest wall pain.   CC: Azzie Roup  History of Present Illness: Pamela Ingram is a 62 y.o. 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  Her breathing is much better.  She is not having chest pain anymore.  She was seen by Dr. Dareen Piano with rheumatology.  She had her prednisone increased to 60 mg daily, and has been tapered down to 20 mg daily.  She was also started on xeljanz 3 weeks ago.  Her joint pain is better.  She denies cough, wheeze, sputum, fever, or hemoptysis.  TESTS: CT chest 03/13/13 >> small Lt pleural effusion with pleural thickening. Labs 03/23/13 >> RF 41, anti CCP < 2, ANA negative, ESR 81 PFT 05/19/13 >> FEV1 2.32 (91%), FEV1% 82, TLC 4.94 (97%), DLCO 81%, no BD  Pamela Ingram  has a past medical history of Anxiety; Hyperlipidemia; Thyroid disease; and Rheumatoid arthritis(714.0).  Pamela Ingram  has past surgical history that includes Vaginal hysterectomy (1995); Tubal ligation;  laparoscospy; and Colonoscopy.  Prior to Admission medications   Medication Sig Start Date End Date Taking? Authorizing Provider  ALPRAZolam Prudy Feeler) 0.5 MG tablet Take 1 tablet (0.5 mg total) by mouth daily as needed for sleep. 04/05/12  Yes Sanjuana Letters, MD  Cholecalciferol (VITAMIN D PO) Take 1 tablet by mouth daily.   Yes Historical Provider, MD  cyclobenzaprine (FLEXERIL) 10 MG tablet Take 10 mg by mouth at bedtime as needed for muscle spasms.   Yes Historical Provider, MD  DULoxetine (CYMBALTA) 60 MG capsule Take 1 capsule (60 mg total) by mouth daily. 04/05/12  Yes Sanjuana Letters, MD  folic acid (FOLVITE) 1 MG tablet Take 1 mg by mouth daily.   Yes Historical Provider, MD  Ibuprofen 200 MG CAPS Tapering off 03/14/13  Yes Coralyn Helling, MD   levothyroxine (SYNTHROID, LEVOTHROID) 50 MCG tablet Take 1 tablet (50 mcg total) by mouth daily. 04/05/12  Yes Sanjuana Letters, MD  methotrexate (RHEUMATREX) 2.5 MG tablet Take 8 tablets (20 mg total) by mouth once a week. Caution:Chemotherapy. Protect from light. Take once a week on saturdays 04/05/12  Yes Sanjuana Letters, MD  simvastatin (ZOCOR) 80 MG tablet Take 1 tablet (80 mg total) by mouth at bedtime. 04/05/12  Yes Sanjuana Letters, MD    Allergies  Allergen Reactions  . Amoxicillin Other (See Comments)    Pt gets yeast infection     Physical Exam:  General - No distress ENT - No sinus tenderness, no oral exudate, no LAN Cardiac - s1s2 regular, no murmur Chest - no wheeze/rales Back - No focal tenderness Abd - Soft, non-tender Ext - No edema Neuro - Normal strength Skin - No rashes Psych - normal mood, and behavior   Assessment/Plan:  Coralyn Helling, MD [place] [facility] Pager:  [phone]

## 2013-06-27 ENCOUNTER — Ambulatory Visit (HOSPITAL_BASED_OUTPATIENT_CLINIC_OR_DEPARTMENT_OTHER): Payer: 59

## 2013-06-27 NOTE — Assessment & Plan Note (Signed)
Concern was for delayed type hypersensitivity reaction to methotrexate.  This has resolved.

## 2013-06-27 NOTE — Assessment & Plan Note (Signed)
She is to follow up with rheumatology. 

## 2013-06-27 NOTE — Assessment & Plan Note (Signed)
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  Improved with prednisone and xeljanz.  Defer further chest imaging studies for now.

## 2013-07-06 ENCOUNTER — Telehealth: Payer: Self-pay | Admitting: Pulmonary Disease

## 2013-07-06 NOTE — Telephone Encounter (Signed)
Please inform pt that this is still likely related to pleurisy from her rheumatoid arthritis.  Recommend continuing prednisone as prescribed by rheumatology.  She should call back if her symptoms get worse again.

## 2013-07-06 NOTE — Telephone Encounter (Signed)
(  continued)  Pt would like to know if VS feels this is normal, if there is something going on in relation to her lungs, etc.  Pamela Ingram

## 2013-07-06 NOTE — Telephone Encounter (Signed)
Called, spoke with pt.  Reports yesterday was the first day off of prednisone.  She had "really bad" pain below neck, in upper chest, and above breast.  She was taking 800 mg ibuprofen throughout the day but still had a temp of 100.3 during the day and felt cold.  She called the Rheumatologist yesterday who put her back on 10 mg pred qd x 1 wk, then 5 mg with OV with them on Oct 2.  Pt states she is much better today with no fever.  Pain in much improved with only mild pain today.  No SOB or cough.  Pt would like to run this by Dr. Craige Cotta and see if he thinks anything could be going on from a lung standpoint.  Dr. Craige Cotta, pls advise.  Thank you.

## 2013-07-06 NOTE — Telephone Encounter (Signed)
Called, spoke with pt.  Informed her of below per Dr. Craige Cotta.  She verbalized understanding.

## 2013-07-09 ENCOUNTER — Emergency Department (HOSPITAL_COMMUNITY): Payer: 59

## 2013-07-09 ENCOUNTER — Emergency Department (INDEPENDENT_AMBULATORY_CARE_PROVIDER_SITE_OTHER): Payer: 59

## 2013-07-09 ENCOUNTER — Emergency Department (HOSPITAL_COMMUNITY)
Admission: EM | Admit: 2013-07-09 | Discharge: 2013-07-09 | Disposition: A | Discharge status: Home / Self Care | Payer: 59 | Attending: Emergency Medicine | Admitting: Emergency Medicine

## 2013-07-09 ENCOUNTER — Emergency Department (HOSPITAL_COMMUNITY)
Admission: EM | Admit: 2013-07-09 | Discharge: 2013-07-09 | Disposition: A | Discharge status: Nursing Home (Includes ICF) | Payer: 59 | Source: Home / Self Care | Attending: Family Medicine | Admitting: Family Medicine

## 2013-07-09 ENCOUNTER — Encounter (HOSPITAL_COMMUNITY): Payer: Self-pay | Admitting: Nurse Practitioner

## 2013-07-09 ENCOUNTER — Encounter (HOSPITAL_COMMUNITY): Payer: Self-pay | Admitting: *Deleted

## 2013-07-09 DIAGNOSIS — Z79899 Other long term (current) drug therapy: Secondary | ICD-10-CM | POA: Insufficient documentation

## 2013-07-09 DIAGNOSIS — Z8739 Personal history of other diseases of the musculoskeletal system and connective tissue: Secondary | ICD-10-CM | POA: Insufficient documentation

## 2013-07-09 DIAGNOSIS — J90 Pleural effusion, not elsewhere classified: Secondary | ICD-10-CM

## 2013-07-09 DIAGNOSIS — E039 Hypothyroidism, unspecified: Secondary | ICD-10-CM | POA: Insufficient documentation

## 2013-07-09 DIAGNOSIS — Z87891 Personal history of nicotine dependence: Secondary | ICD-10-CM | POA: Insufficient documentation

## 2013-07-09 DIAGNOSIS — R109 Unspecified abdominal pain: Secondary | ICD-10-CM

## 2013-07-09 DIAGNOSIS — F411 Generalized anxiety disorder: Secondary | ICD-10-CM | POA: Insufficient documentation

## 2013-07-09 DIAGNOSIS — E785 Hyperlipidemia, unspecified: Secondary | ICD-10-CM | POA: Insufficient documentation

## 2013-07-09 DIAGNOSIS — IMO0002 Reserved for concepts with insufficient information to code with codable children: Secondary | ICD-10-CM | POA: Insufficient documentation

## 2013-07-09 LAB — POCT I-STAT, CHEM 8
BUN: 18 mg/dL (ref 6–23)
Calcium, Ion: 1.18 mmol/L (ref 1.13–1.30)
Chloride: 103 mEq/L (ref 96–112)
Creatinine, Ser: 1 mg/dL (ref 0.50–1.10)
Glucose, Bld: 119 mg/dL — ABNORMAL HIGH (ref 70–99)
HCT: 42 % (ref 36.0–46.0)
Hemoglobin: 14.3 g/dL (ref 12.0–15.0)
Potassium: 4.1 mEq/L (ref 3.5–5.1)
Sodium: 141 mEq/L (ref 135–145)
TCO2: 26 mmol/L (ref 0–100)

## 2013-07-09 LAB — POCT URINALYSIS DIP (DEVICE)
Bilirubin Urine: NEGATIVE
Glucose, UA: NEGATIVE mg/dL
Hgb urine dipstick: NEGATIVE
Ketones, ur: NEGATIVE mg/dL
Leukocytes, UA: NEGATIVE
Nitrite: NEGATIVE
Protein, ur: NEGATIVE mg/dL
Specific Gravity, Urine: 1.025 (ref 1.005–1.030)
Urobilinogen, UA: 1 mg/dL (ref 0.0–1.0)
pH: 5.5 (ref 5.0–8.0)

## 2013-07-09 LAB — POCT I-STAT TROPONIN I: Troponin i, poc: 0 ng/mL (ref 0.00–0.08)

## 2013-07-09 MED ORDER — PREDNISONE 50 MG PO TABS: ORAL_TABLET | ORAL | Status: DC

## 2013-07-09 MED ORDER — IOHEXOL 350 MG/ML SOLN
80.0000 mL | Freq: Once | INTRAVENOUS | Status: AC | PRN
  Administered 2013-07-09: 80 mL via INTRAVENOUS

## 2013-07-09 MED ORDER — HYDROCODONE-ACETAMINOPHEN 5-325 MG PO TABS: 2.0000 | ORAL_TABLET | ORAL | Status: DC | PRN

## 2013-07-09 NOTE — ED Notes (Addendum)
Patient complains of left flank pain that started 3 days ago. States that she is on treatment for pleuricy ; denies dysuria. States some fever chills; denies diarrhea.

## 2013-07-09 NOTE — ED Notes (Signed)
Pt sent from [facility] for further eval of R sided pleural effusion on xray there today. Pt reports one week hx of upper back pain and then flank pain. She has rheumatoid arthritis and her doctor increased her prednisone this week with no relief of pain.

## 2013-07-09 NOTE — ED Provider Notes (Signed)
CSN: [id number]     Arrival date & time 07/09/13  1259 History   First MD Initiated Contact with Patient 07/09/13 1324     Chief Complaint  Patient presents with  . Back Pain   (Consider location/radiation/quality/duration/timing/severity/associated sxs/prior Treatment) HPI Comments: Patient has a history of rheumatoid arthritis and pleurisy presenting from urgent care with right flank pain has been intermittent for the past 3 days. She's had episodes of pleurisy in the past that have been migrating across her chest and back. She's not had pain in his flank before. It comes and goes there is no associated urinary symptoms. She denies any cough, fever, anterior chest pain. Her pleurisy is thought to be due to methotrexate reaction. She took 15 mg of prednisone yesterday in an attempt to improve the pain. She was referred to the ED when her x-ray showed a small right-sided pleural effusion.  The history is provided by the patient.    Past Medical History  Diagnosis Date  . Anxiety   . Hyperlipidemia   . Thyroid disease     hypothyroidism  . Rheumatoid arthritis(714.0)    Past Surgical History  Procedure Laterality Date  . Vaginal hysterectomy  1995  . Tubal ligation    .  laparoscospy    . Colonoscopy     Family History  Problem Relation Age of Onset  . Colon cancer Neg Hx   . Rectal cancer Neg Hx   . Stomach cancer Neg Hx   . Esophageal cancer Neg Hx   . Emphysema Mother   . Rheum arthritis Mother    History  Substance Use Topics  . Smoking status: Former Smoker -- 0.50 packs/day for 44 years    Quit date: 10/26/2009  . Smokeless tobacco: Never Used     Comment: off and on x 44 years 03/14/13  . Alcohol Use: Yes     Comment: occasional   OB History   Grav Para Term Preterm Abortions TAB SAB Ect Mult Living                 Review of Systems  Constitutional: Negative for fever, activity change and appetite change.  HENT: Negative for congestion and rhinorrhea.    Respiratory: Positive for chest tightness and shortness of breath. Negative for cough.   Cardiovascular: Negative for chest pain.  Gastrointestinal: Negative for nausea, vomiting and abdominal pain.  Genitourinary: Positive for flank pain. Negative for dysuria, hematuria, vaginal bleeding and vaginal discharge.  Musculoskeletal: Positive for back pain.  Skin: Negative for rash.  Neurological: Negative for dizziness, weakness and headaches.  A complete 10 system review of systems was obtained and all systems are negative except as noted in the HPI and PMH.    Allergies  Amoxicillin  Home Medications   Current Outpatient Rx  Name  Route  Sig  Dispense  Refill  . albuterol (PROVENTIL HFA;VENTOLIN HFA) 108 (90 BASE) MCG/ACT inhaler   Inhalation   Inhale 2 puffs into the lungs every 6 (six) hours as needed for wheezing.         [name] ALPRAZolam (XANAX) 0.5 MG tablet   Oral   Take 0.5 mg by mouth 3 (three) times daily as needed for sleep or anxiety.          . cyclobenzaprine (FLEXERIL) 10 MG tablet   Oral   Take 10 mg by mouth daily as needed for muscle spasms.         . DULoxetine (CYMBALTA) 60 MG capsule  Oral   Take 60 mg by mouth every morning.          [name] levothyroxine (SYNTHROID, LEVOTHROID) 50 MCG tablet   Oral   Take 1 tablet (50 mcg total) by mouth daily.         . predniSONE (DELTASONE) 10 MG tablet   Oral   Take 15 mg by mouth daily.         . simvastatin (ZOCOR) 20 MG tablet   Oral   Take 20 mg by mouth every evening.         . Tofacitinib Citrate (XELJANZ) 5 MG TABS   Oral   Take 5 mg by mouth 2 (two) times daily.          [name] HYDROcodone-acetaminophen (NORCO/VICODIN) 5-325 MG per tablet   Oral   Take 2 tablets by mouth every 4 (four) hours as needed for pain.   10 tablet   0   . predniSONE (DELTASONE) 50 MG tablet      1 tablet PO daily   10 tablet   0    BP 134/84  Pulse 90  Temp(Src) 97.7 F (36.5 C) (Oral)  Resp 23  SpO2  96% Physical Exam  Constitutional: She is oriented to person, place, and time. She appears well-developed and well-nourished. No distress.  HENT:  Head: Normocephalic and atraumatic.  Mouth/Throat: Oropharynx is clear and moist. No oropharyngeal exudate.  Eyes: Conjunctivae and EOM are normal. Pupils are equal, round, and reactive to light.  Neck: Normal range of motion. Neck supple.  Cardiovascular: Normal rate, regular rhythm and normal heart sounds.   No murmur heard. Pulmonary/Chest: Effort normal and breath sounds normal. No respiratory distress.  Abdominal: Soft. There is no tenderness. There is no rebound and no guarding.  Musculoskeletal: Normal range of motion. She exhibits tenderness. She exhibits no edema.  Right paraspinal lumbar tenderness. No CVA tenderness no rash  Neurological: She is alert and oriented to person, place, and time. No cranial nerve deficit. She exhibits normal muscle tone. Coordination normal.  5/5 strength in bilateral lower extremities. Ankle plantar and dorsiflexion intact. Great toe extension intact bilaterally. +2 DP and PT pulses. +2 patellar reflexes bilaterally. Normal gait.   Skin: Skin is warm.    ED Course  Procedures (including critical care time) Labs Review Labs Reviewed  POCT I-STAT, CHEM 8 - Abnormal; Notable for the following:    Glucose, Bld 119 (*)    All other components within normal limits  POCT I-STAT TROPONIN I   Imaging Review Ct Abdomen Pelvis Wo Contrast  07/09/2013   CLINICAL DATA:  62 year old female with right pleural effusion, a upper back and flank pain. Rheumatoid arthritis. Pleuritic pain. Possible kidney stones.  EXAM: CT ANGIOGRAPHY CHEST  CT ABDOMEN AND PELVIS WITHOUT CONTRAST  TECHNIQUE: Multidetector CT imaging of the chest was performed using the standard protocol during bolus administration of intravenous contrast. Multiplanar CT image reconstructions including MIPs were obtained to evaluate the vascular anatomy.  Multidetector CT imaging of the abdomen and pelvis was performed using the standard protocol prior to administration of intravenous contrast.  CONTRAST:  80mL OMNIPAQUE IOHEXOL 350 MG/ML SOLN  COMPARISON:  Chest CTA 04/06/2013. [facility] CT Abdomen and Pelvis 09/23/2011.  FINDINGS: CTA CHEST FINDINGS  Good contrast bolus timing in the pulmonary arterial tree. No focal filling defect identified in the pulmonary arterial tree to suggest the presence of acute pulmonary embolism.Negative thoracic aorta except for mild atherosclerosis.  Layering right greater than left  pleural effusions are increased since June. No significant pericardial effusion. No mediastinal lymphadenopathy. Negative thoracic inlet.  Major airways are patent except for atelectatic changes. Confluent right base enhancing atelectatic lung. No definite consolidation. Superimposed dependent atelectasis throughout the lungs.  No acute osseous abnormality identified.  Review of the MIP images confirms the above findings.  CTA ABDOMEN and PELVIS FINDINGS  No acute osseous abnormality identified.  No pelvic free fluid. The bladder is distended. 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 Retained stool in distal redundant colon. No distal colon inflammation. Retained stool throughout the more proximal colon. Transverse colon is redundant. The cecum is located near the midline. Negative ileocecal valve. Normal appendix mostly located along the right hemipelvis. No dilated small bowel. Decompressed stomach.  Negative non contrast appearance of the liver, gallbladder, spleen, pancreas and adrenal glands. Trace excreted IV contrast suspected in the renal collecting systems. No discrete urologic calculus. No hydronephrosis or perinephric stranding. No periureteral stranding. No abdominal free fluid. No lymphadenopathy identified.  Review of the MIP images confirms the above findings.  IMPRESSION: CTA CHEST IMPRESSION  1. No evidence of acute pulmonary  embolus.  2. Increased right greater than left pleural effusions. Associated right worse than left atelectasis. No definite pneumonia.  CTA ABDOMEN and PELVIS IMPRESSION  1.  Distended bladder. No urologic calculus or obstructed uropathy.  2. No focal inflammatory changes identified in the abdomen or pelvis. Normal appendix.   Electronically Signed   By: Augusto Gamble M.D.   On: 07/09/2013 16:07   Ct Angio Chest Pe W/cm &/or Wo Cm  07/09/2013   CLINICAL DATA:  62 year old female with right pleural effusion, a upper back and flank pain. Rheumatoid arthritis. Pleuritic pain. Possible kidney stones.  EXAM: CT ANGIOGRAPHY CHEST  CT ABDOMEN AND PELVIS WITHOUT CONTRAST  TECHNIQUE: Multidetector CT imaging of the chest was performed using the standard protocol during bolus administration of intravenous contrast. Multiplanar CT image reconstructions including MIPs were obtained to evaluate the vascular anatomy. Multidetector CT imaging of the abdomen and pelvis was performed using the standard protocol prior to administration of intravenous contrast.  CONTRAST:  80mL OMNIPAQUE IOHEXOL 350 MG/ML SOLN  COMPARISON:  Chest CTA 04/06/2013. [facility] CT Abdomen and Pelvis 09/23/2011.  FINDINGS: CTA CHEST FINDINGS  Good contrast bolus timing in the pulmonary arterial tree. No focal filling defect identified in the pulmonary arterial tree to suggest the presence of acute pulmonary embolism.Negative thoracic aorta except for mild atherosclerosis.  Layering right greater than left pleural effusions are increased since June. No significant pericardial effusion. No mediastinal lymphadenopathy. Negative thoracic inlet.  Major airways are patent except for atelectatic changes. Confluent right base enhancing atelectatic lung. No definite consolidation. Superimposed dependent atelectasis throughout the lungs.  No acute osseous abnormality identified.  Review of the MIP images confirms the above findings.  CTA ABDOMEN and PELVIS  FINDINGS  No acute osseous abnormality identified.  No pelvic free fluid. The bladder is distended. 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 Retained stool in distal redundant colon. No distal colon inflammation. Retained stool throughout the more proximal colon. Transverse colon is redundant. The cecum is located near the midline. Negative ileocecal valve. Normal appendix mostly located along the right hemipelvis. No dilated small bowel. Decompressed stomach.  Negative non contrast appearance of the liver, gallbladder, spleen, pancreas and adrenal glands. Trace excreted IV contrast suspected in the renal collecting systems. No discrete urologic calculus. No hydronephrosis or perinephric stranding. No periureteral stranding. No abdominal free fluid. No lymphadenopathy identified.  Review  of the MIP images confirms the above findings.  IMPRESSION: CTA CHEST IMPRESSION  1. No evidence of acute pulmonary embolus.  2. Increased right greater than left pleural effusions. Associated right worse than left atelectasis. No definite pneumonia.  CTA ABDOMEN and PELVIS IMPRESSION  1.  Distended bladder. No urologic calculus or obstructed uropathy.  2. No focal inflammatory changes identified in the abdomen or pelvis. Normal appendix.   Electronically Signed   By: Augusto Gamble M.D.   On: 07/09/2013 16:07   Dg Abd Acute W/chest  07/09/2013   CLINICAL DATA:  Right side pain for 3 days  EXAM: ACUTE ABDOMEN SERIES (ABDOMEN 2 VIEW & CHEST 1 VIEW)  COMPARISON:  05/16/2013  FINDINGS: Cardiomediastinal silhouette is stable. There is small right pleural effusion with right basilar atelectasis on infiltrate. Trace left basilar atelectasis or infiltrate. No pulmonary edema. There is nonspecific nonobstructive bowel gas pattern. Significant stool noted throughout the colon. No free abdominal air.  IMPRESSION: 1. There is small right pleural effusion with right basilar atelectasis or infiltrate. Trace left basilar atelectasis or infiltrate.  No pulmonary edema. 2. Nonspecific nonobstructive bowel gas pattern. Significant stool throughout the colon. 3. No free abdominal air.   Electronically Signed   By: Natasha Mead   On: 07/09/2013 12:33    MDM   1. Pleurisy with effusion    Right-sided flank pain has been intermittent for the past 3 days. Patient had issues with rheumatoid arthritis recurrent pleurisy. Sent from urgent care with possible pleural effusion today.  Urinalysis negative for hematuria. Doubt kidney stone. No hypoxia or increased work of breathing.  CT shows no PE or kidney stone.  Small effusions bilaterally, R>L, no infiltrates. Patient with no clinical evidence of CHF.  Results dw Dr. Herma Carson, on call pulmonologist. He agrees effusions likely 2/2 history of RA and agrees with continuing prednisone.  Patient given 10 days of prednisone, analgesics. Advised to call Dr. Dareen Piano her RA doctor ASAP tomorrow. Return precautions discussed.   Date: 07/09/2013  Rate: 90  Rhythm: normal sinus rhythm  QRS Axis: normal  Intervals: normal  ST/T Wave abnormalities: normal  Conduction Disutrbances:none  Narrative Interpretation:   Old EKG Reviewed: none available      Glynn Octave, MD 07/09/13 1840

## 2013-07-09 NOTE — ED Provider Notes (Signed)
CSN: [id number]     Arrival date & time 07/09/13  1049 History   First MD Initiated Contact with Patient 07/09/13 1144     Chief Complaint  Patient presents with  . Flank Pain   (Consider location/radiation/quality/duration/timing/severity/associated sxs/prior Treatment) HPI Comments: [age]-year-old female with a history of rheumatoid arthritis and pleurisy presents complaining of severe intermittent right flank pain for the past 3 days. She has intermittent bouts of severe cramping pain which resolves after a short time, happening frequently. The pain in the flank is also made much worse with taking a deep breath. She states the pain is very severe, 10 out of 10 when it happens. She's never had anything like this before. She denies hematuria, dysuria, history of kidney stones. She does have a recent history of pleurisy for the past 6-8 months and had a left pleural effusion drained a few months ago. She has been followed by pulmonology for the pleurisy, they have not found a definite cause as of yet.  Patient is a 62 y.o. female presenting with flank pain.  Flank Pain Associated symptoms include chest pain. Pertinent negatives include no abdominal pain and no shortness of breath.    Past Medical History  Diagnosis Date  . Anxiety   . Hyperlipidemia   . Thyroid disease     hypothyroidism  . Rheumatoid arthritis(714.0)    Past Surgical History  Procedure Laterality Date  . Vaginal hysterectomy  1995  . Tubal ligation    .  laparoscospy    . Colonoscopy     Family History  Problem Relation Age of Onset  . Colon cancer Neg Hx   . Rectal cancer Neg Hx   . Stomach cancer Neg Hx   . Esophageal cancer Neg Hx   . Emphysema Mother   . Rheum arthritis Mother    History  Substance Use Topics  . Smoking status: Former Smoker -- 0.50 packs/day for 44 years    Quit date: 10/26/2009  . Smokeless tobacco: Never Used     Comment: off and on x 44 years 03/14/13  . Alcohol Use: Yes      Comment: occasional   OB History   Grav Para Term Preterm Abortions TAB SAB Ect Mult Living                 Review of Systems  Constitutional: Negative for fever and chills.  Eyes: Negative for visual disturbance.  Respiratory: Positive for cough. Negative for shortness of breath.   Cardiovascular: Positive for chest pain. Negative for palpitations and leg swelling.  Gastrointestinal: Negative for nausea, vomiting and abdominal pain.  Endocrine: Negative for polydipsia and polyuria.  Genitourinary: Positive for flank pain. Negative for dysuria, urgency, frequency, hematuria and pelvic pain.  Musculoskeletal: Negative for myalgias and arthralgias.  Skin: Negative for rash.  Neurological: Negative for dizziness, weakness and light-headedness.    Allergies  Amoxicillin  Home Medications   Current Outpatient Rx  Name  Route  Sig  Dispense  Refill  . ALPRAZolam (XANAX) 0.5 MG tablet   Oral   Take 0.5 mg by mouth 3 (three) times daily as needed for sleep or anxiety.          . DULoxetine (CYMBALTA) 60 MG capsule   Oral   Take 60 mg by mouth every morning.          [name] levothyroxine (SYNTHROID, LEVOTHROID) 50 MCG tablet   Oral   Take 1 tablet (50 mcg total) by mouth daily.         [name]  predniSONE (DELTASONE) 10 MG tablet   Oral   Take 20-40 mg by mouth daily. Tapered dose: Take 40 mg for 4 days, 30 mg for 4 days, then 20 mg daily.         . simvastatin (ZOCOR) 20 MG tablet   Oral   Take 20 mg by mouth every evening.         . Tofacitinib Citrate (XELJANZ) 5 MG TABS   Oral   Take 1 tablet by mouth 2 (two) times daily.         [name] albuterol (PROAIR HFA) 108 (90 BASE) MCG/ACT inhaler   Inhalation   Inhale 2 puffs into the lungs every 6 (six) hours as needed for wheezing.   1 Inhaler   3    BP 141/88  Pulse 98  Temp(Src) 98.4 F (36.9 C)  Resp 18  SpO2 100% Physical Exam  Nursing note and vitals reviewed. Constitutional: She is oriented to person, place,  and time. Vital signs are normal. She appears well-developed and well-nourished. No distress.  HENT:  Head: Normocephalic and atraumatic.  Abdominal: There is CVA tenderness (right).  Neurological: She is alert and oriented to person, place, and time. She has normal strength. Coordination normal.  Skin: Skin is warm and dry. No rash noted. She is not diaphoretic.  Psychiatric: She has a normal mood and affect. Judgment normal.    ED Course  Procedures (including critical care time) Labs Review Labs Reviewed  POCT URINALYSIS DIP (DEVICE)   Imaging Review Dg Abd Acute W/chest  07/09/2013   CLINICAL DATA:  Right side pain for 3 days  EXAM: ACUTE ABDOMEN SERIES (ABDOMEN 2 VIEW & CHEST 1 VIEW)  COMPARISON:  05/16/2013  FINDINGS: Cardiomediastinal silhouette is stable. There is small right pleural effusion with right basilar atelectasis on infiltrate. Trace left basilar atelectasis or infiltrate. No pulmonary edema. There is nonspecific nonobstructive bowel gas pattern. Significant stool noted throughout the colon. No free abdominal air.  IMPRESSION: 1. There is small right pleural effusion with right basilar atelectasis or infiltrate. Trace left basilar atelectasis or infiltrate. No pulmonary edema. 2. Nonspecific nonobstructive bowel gas pattern. Significant stool throughout the colon. 3. No free abdominal air.   Electronically Signed   By: Natasha Mead   On: 07/09/2013 12:33    MDM   1. Flank pain   2. Pleural effusion    Performed an x-ray suspecting possible kidney stones. There is a large right pleural effusion, with a consolidation in the lower left lung is well. Patient is being transferred to the emergency department for further evaluation. 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 She will probably need a CT and thoracentesis to evaluate this further    Graylon Good, PA-C 07/09/13 1248

## 2013-07-09 NOTE — ED Notes (Signed)
Patient transported to CT 

## 2013-07-10 ENCOUNTER — Telehealth: Payer: Self-pay | Admitting: Pulmonary Disease

## 2013-07-10 NOTE — ED Provider Notes (Signed)
Medical screening examination/treatment/procedure(s) were performed by a resident physician or non-physician practitioner and as the supervising physician I was immediately available for consultation/collaboration.  Kloee Ballew, MD    Taiz Bickle S Saim Almanza, MD 07/10/13 0801 

## 2013-07-10 NOTE — Telephone Encounter (Signed)
ATC PT NA will forward to VS so he is aware as an FYI and if pt needs to follow up with him for this.

## 2013-07-10 NOTE — Telephone Encounter (Signed)
Pt returned call. Pamela Ingram °

## 2013-07-10 NOTE — Telephone Encounter (Signed)
I spoke with Pamela Ingram. She stated she was seen in the ER yesterday. She was told her Pleural Effusions is coming from her rheumatoid arthritis. She stated she does not feel like she needs to see VS. She is going to rheumatology today at 11 AM. Will forward to Dr. Craige Cotta as an Lorain Childes

## 2013-07-14 ENCOUNTER — Other Ambulatory Visit (HOSPITAL_COMMUNITY): Payer: 59

## 2013-07-14 NOTE — Telephone Encounter (Signed)
Please advise if msg can be closed

## 2013-07-14 NOTE — Telephone Encounter (Signed)
I am keeping message open, and will follow up on my own.

## 2013-07-17 ENCOUNTER — Other Ambulatory Visit (HOSPITAL_COMMUNITY): Payer: Self-pay | Admitting: Internal Medicine

## 2013-07-17 DIAGNOSIS — R072 Precordial pain: Secondary | ICD-10-CM

## 2013-07-19 ENCOUNTER — Telehealth: Payer: Self-pay | Admitting: Pulmonary Disease

## 2013-07-19 NOTE — Telephone Encounter (Signed)
She needs to have follow up CXR.  Would be best to have her come to office for visit this week to further assess pleural effusions and whether she needs to have thoracentesis.  Please schedule her for ROV with me or Tammy Parrett this week with chest xray first.

## 2013-07-19 NOTE — Telephone Encounter (Signed)
Okay to double book visit. 

## 2013-07-19 NOTE — Telephone Encounter (Signed)
Called, spoke with pt -  She was seen by Rheumatologist last week.  Pt reports he informed her that he doesn't think the RA is causing per problems and he didn't know what to do for her. Pt states the pain is much better, not having anymore severe pain right now, but still doesn't feel right.  She would like to touch base with VS to see if there is anything else that needs to be done.  She is leaving the Rheumatologist that she seen last week and will be doing back to the one at [facility].  She has an appt on Oct 20 at [facility].  Pt would also like to know if it is ok to get the flu vaccine with this going on.  She would like to get it if she can.  Dr. Craige Cotta, pls advise.  Thank you.

## 2013-07-19 NOTE — Telephone Encounter (Signed)
Spoke with the pt and notified of recs per VS She verbalized understanding  OV with VS at 3 pm on Friday

## 2013-07-19 NOTE — Telephone Encounter (Signed)
Spoke with pt and notified of recs per VS Since VS had no openings, I offered ov with TP She refuses to see TP stating "I do not want to talk to her" Only wants to see VS Is it okay to overbook? Please advise, thanks!

## 2013-07-20 ENCOUNTER — Other Ambulatory Visit (HOSPITAL_COMMUNITY): Payer: 59

## 2013-07-21 ENCOUNTER — Ambulatory Visit (INDEPENDENT_AMBULATORY_CARE_PROVIDER_SITE_OTHER)
Admission: RE | Admit: 2013-07-21 | Discharge: 2013-07-21 | Disposition: A | Discharge status: Home / Self Care | Payer: 59 | Source: Ambulatory Visit | Attending: Pulmonary Disease | Admitting: Pulmonary Disease

## 2013-07-21 ENCOUNTER — Ambulatory Visit (INDEPENDENT_AMBULATORY_CARE_PROVIDER_SITE_OTHER): Payer: 59 | Admitting: Pulmonary Disease

## 2013-07-21 ENCOUNTER — Encounter: Payer: Self-pay | Admitting: Pulmonary Disease

## 2013-07-21 VITALS — BP 102/70 | HR 79 | Ht 64.0 in | Wt 158.0 lb

## 2013-07-21 DIAGNOSIS — M069 Rheumatoid arthritis, unspecified: Secondary | ICD-10-CM

## 2013-07-21 DIAGNOSIS — R071 Chest pain on breathing: Secondary | ICD-10-CM

## 2013-07-21 DIAGNOSIS — R0781 Pleurodynia: Secondary | ICD-10-CM

## 2013-07-21 NOTE — Assessment & Plan Note (Signed)
She has recurrent pleural effusions with pleuritic chest pain.  This was originally on left and then on right.  This is most likely inflammatory related to her rheumatoid arthritis.  Unfortunately have never been able to obtain fluid samples for analysis.  She has improved with increased prednisone.  She is to remain on 5 mg prednisone and xeljanz until she has follow up with rheumatology in [place].

## 2013-07-21 NOTE — Patient Instructions (Signed)
Follow up in 2 months

## 2013-07-21 NOTE — Progress Notes (Signed)
Chief Complaint  Patient presents with  . Follow-up    Chest pain still present but not as bad. Cough is present but is not productive.    History of Present Illness: Pamela Ingram is a 62 y.o. female former smoker with hx of rheumatoid arthritis with recurrent pleuritic chest pain and pleural effusions.  She developed recurrent, severe Rt pleuritic chest pain.  She went to the ER on Sept 14.  She was found to have recurrent pleural effusion.  She had her prednisone dose increased.  She is feeling much better.  She is able to take deep breaths.  She still has cough, but no sputum.  She is not having fever, sweats, or abdominal pain.  Her joint pain is better.  She notices getting flush in her neck since being on prednisone, but no other rashes.  She remains on xeljanz 3, and is down to 5 mg prednisone daily.  She is going to follow up with Dr. Clovis Riley in [facility] for rheumatology.  TESTS: CT chest 03/13/13 >> small Lt pleural effusion with pleural thickening. Labs 03/23/13 >> RF 41, anti CCP < 2, ANA negative, ESR 81 PFT 05/19/13 >> FEV1 2.32 (91%), FEV1% 82, TLC 4.94 (97%), DLCO 81%, no BD CT chest 07/09/13 >> no PE, Rt > Lt effusions, ATX Rt base  Pamela Ingram  has a past medical history of Anxiety; Hyperlipidemia; Thyroid disease; and Rheumatoid arthritis(714.0).  Pamela Ingram  has past surgical history that includes Vaginal hysterectomy (1995); Tubal ligation;  laparoscospy; and Colonoscopy.  Prior to Admission medications   Medication Sig Start Date End Date Taking? Authorizing Provider  ALPRAZolam Prudy Feeler) 0.5 MG tablet Take 1 tablet (0.5 mg total) by mouth daily as needed for sleep. 04/05/12  Yes Sanjuana Letters, MD  Cholecalciferol (VITAMIN D PO) Take 1 tablet by mouth daily.   Yes Historical Provider, MD  cyclobenzaprine (FLEXERIL) 10 MG tablet Take 10 mg by mouth at bedtime as needed for muscle spasms.   Yes Historical Provider, MD  DULoxetine (CYMBALTA) 60 MG capsule Take 1 capsule (60  mg total) by mouth daily. 04/05/12  Yes Sanjuana Letters, MD  folic acid (FOLVITE) 1 MG tablet Take 1 mg by mouth daily.   Yes Historical Provider, MD  Ibuprofen 200 MG CAPS Tapering off 03/14/13  Yes Coralyn Helling, MD  levothyroxine (SYNTHROID, LEVOTHROID) 50 MCG tablet Take 1 tablet (50 mcg total) by mouth daily. 04/05/12  Yes Sanjuana Letters, MD  methotrexate (RHEUMATREX) 2.5 MG tablet Take 8 tablets (20 mg total) by mouth once a week. Caution:Chemotherapy. Protect from light. Take once a week on saturdays 04/05/12  Yes Sanjuana Letters, MD  simvastatin (ZOCOR) 80 MG tablet Take 1 tablet (80 mg total) by mouth at bedtime. 04/05/12  Yes Sanjuana Letters, MD    Allergies  Allergen Reactions  . Amoxicillin Other (See Comments)    Pt gets yeast infection     Physical Exam:  General - No distress ENT - No sinus tenderness, no oral exudate, no LAN Cardiac - s1s2 regular, no murmur Chest - no wheeze/rales Back - No focal tenderness Abd - Soft, non-tender Ext - No edema Neuro - Normal strength Skin - No rashes Psych - normal mood, and behavior  Dg Chest 2 View  07/21/2013   *RADIOLOGY REPORT*  Clinical Data: Cough and chest discomfort.  CHEST - 2 VIEW  Comparison: CT chest and chest radiograph 07/09/2013.  Findings: Trachea is midline.  Heart size normal.  Interval  clearing of bilateral pleural effusions and bibasilar air space disease, right greater than left.  Lungs are otherwise clear. Biapical pleural thickening.  IMPRESSION: Interval clearing of bilateral pleural effusions and bibasilar air space disease.   Original Report Authenticated By: Leanna Battles, M.D.      Assessment/Plan:  Coralyn Helling, MD [place] [facility]/Critical Care/Sleep Pager:  [phone]

## 2013-07-28 ENCOUNTER — Emergency Department (HOSPITAL_BASED_OUTPATIENT_CLINIC_OR_DEPARTMENT_OTHER)
Admission: EM | Admit: 2013-07-28 | Discharge: 2013-07-28 | Disposition: A | Discharge status: Home / Self Care | Payer: 59 | Attending: Emergency Medicine | Admitting: Emergency Medicine

## 2013-07-28 ENCOUNTER — Emergency Department (HOSPITAL_BASED_OUTPATIENT_CLINIC_OR_DEPARTMENT_OTHER): Payer: 59

## 2013-07-28 ENCOUNTER — Encounter (HOSPITAL_BASED_OUTPATIENT_CLINIC_OR_DEPARTMENT_OTHER): Payer: Self-pay | Admitting: Emergency Medicine

## 2013-07-28 DIAGNOSIS — R42 Dizziness and giddiness: Secondary | ICD-10-CM | POA: Insufficient documentation

## 2013-07-28 DIAGNOSIS — M549 Dorsalgia, unspecified: Secondary | ICD-10-CM | POA: Insufficient documentation

## 2013-07-28 DIAGNOSIS — Z87891 Personal history of nicotine dependence: Secondary | ICD-10-CM | POA: Insufficient documentation

## 2013-07-28 DIAGNOSIS — Z8709 Personal history of other diseases of the respiratory system: Secondary | ICD-10-CM | POA: Insufficient documentation

## 2013-07-28 DIAGNOSIS — Z79899 Other long term (current) drug therapy: Secondary | ICD-10-CM | POA: Insufficient documentation

## 2013-07-28 DIAGNOSIS — Z880 Allergy status to penicillin: Secondary | ICD-10-CM | POA: Insufficient documentation

## 2013-07-28 DIAGNOSIS — M069 Rheumatoid arthritis, unspecified: Secondary | ICD-10-CM | POA: Insufficient documentation

## 2013-07-28 DIAGNOSIS — F411 Generalized anxiety disorder: Secondary | ICD-10-CM | POA: Insufficient documentation

## 2013-07-28 DIAGNOSIS — M542 Cervicalgia: Secondary | ICD-10-CM | POA: Insufficient documentation

## 2013-07-28 DIAGNOSIS — I951 Orthostatic hypotension: Secondary | ICD-10-CM | POA: Insufficient documentation

## 2013-07-28 DIAGNOSIS — E039 Hypothyroidism, unspecified: Secondary | ICD-10-CM | POA: Insufficient documentation

## 2013-07-28 DIAGNOSIS — M2550 Pain in unspecified joint: Secondary | ICD-10-CM | POA: Insufficient documentation

## 2013-07-28 DIAGNOSIS — E785 Hyperlipidemia, unspecified: Secondary | ICD-10-CM | POA: Insufficient documentation

## 2013-07-28 HISTORY — DX: Pleural effusion, not elsewhere classified: J90

## 2013-07-28 LAB — URINALYSIS, ROUTINE W REFLEX MICROSCOPIC
Bilirubin Urine: NEGATIVE
Glucose, UA: NEGATIVE mg/dL
Hgb urine dipstick: NEGATIVE
Ketones, ur: NEGATIVE mg/dL
Nitrite: NEGATIVE
Protein, ur: 30 mg/dL — AB
Specific Gravity, Urine: 1.022 (ref 1.005–1.030)
Urobilinogen, UA: 1 mg/dL (ref 0.0–1.0)
pH: 7 (ref 5.0–8.0)

## 2013-07-28 LAB — COMPREHENSIVE METABOLIC PANEL
ALT: 6 U/L (ref 0–35)
AST: 19 U/L (ref 0–37)
Albumin: 3.9 g/dL (ref 3.5–5.2)
Alkaline Phosphatase: 77 U/L (ref 39–117)
BUN: 19 mg/dL (ref 6–23)
CO2: 28 mEq/L (ref 19–32)
Calcium: 10 mg/dL (ref 8.4–10.5)
Chloride: 99 mEq/L (ref 96–112)
Creatinine, Ser: 0.9 mg/dL (ref 0.50–1.10)
GFR calc Af Amer: 78 mL/min — ABNORMAL LOW (ref >90)
GFR calc non Af Amer: 68 mL/min — ABNORMAL LOW (ref >90)
Glucose, Bld: 104 mg/dL — ABNORMAL HIGH (ref 70–99)
Potassium: 3.8 mEq/L (ref 3.5–5.1)
Sodium: 138 mEq/L (ref 135–145)
Total Bilirubin: 0.4 mg/dL (ref 0.3–1.2)
Total Protein: 7.5 g/dL (ref 6.0–8.3)

## 2013-07-28 LAB — URINE MICROSCOPIC-ADD ON

## 2013-07-28 LAB — CBC WITH DIFFERENTIAL/PLATELET
Basophils Absolute: 0 10*3/uL (ref 0.0–0.1)
Basophils Relative: 0 % (ref 0–1)
Eosinophils Absolute: 0.1 10*3/uL (ref 0.0–0.7)
Eosinophils Relative: 1 % (ref 0–5)
HCT: 42.2 % (ref 36.0–46.0)
Hemoglobin: 13.7 g/dL (ref 12.0–15.0)
Lymphocytes Relative: 36 % (ref 12–46)
Lymphs Abs: 3.1 10*3/uL (ref 0.7–4.0)
MCH: 29.2 pg (ref 26.0–34.0)
MCHC: 32.5 g/dL (ref 30.0–36.0)
MCV: 90 fL (ref 78.0–100.0)
Monocytes Absolute: 0.7 10*3/uL (ref 0.1–1.0)
Monocytes Relative: 8 % (ref 3–12)
Neutro Abs: 4.7 10*3/uL (ref 1.7–7.7)
Neutrophils Relative %: 54 % (ref 43–77)
Platelets: 373 10*3/uL (ref 150–400)
RBC: 4.69 MIL/uL (ref 3.87–5.11)
RDW: 13.8 % (ref 11.5–15.5)
WBC: 8.7 10*3/uL (ref 4.0–10.5)

## 2013-07-28 LAB — TROPONIN I: Troponin I: <0.3 ng/mL (ref <0.30)

## 2013-07-28 MED ORDER — SODIUM CHLORIDE 0.9 % IV BOLUS (SEPSIS)
1000.0000 mL | Freq: Once | INTRAVENOUS | Status: AC
  Administered 2013-07-28: 1000 mL via INTRAVENOUS

## 2013-07-28 NOTE — ED Notes (Signed)
Pt reports taking a flexeril at 0100 for shoulder pain which has been persistent x 6-7 months

## 2013-07-28 NOTE — ED Notes (Signed)
Pt was very anxious in triage. Pt appears less anxious now.

## 2013-07-28 NOTE — ED Notes (Signed)
Pt awoke feeling faint. Pt states she stood up and had near syncopal episode. Pt c/o pain in shoulders.

## 2013-07-28 NOTE — ED Provider Notes (Signed)
CSN: [id number]     Arrival date & time 07/28/13  1478 History   First MD Initiated Contact with Patient 07/28/13 0532     Chief Complaint  Patient presents with  . Near Syncope   (Consider location/radiation/quality/duration/timing/severity/associated sxs/prior Treatment) HPI Patient with a history of rheumatoid arthritis and anxiety presented with a near syncopal episode this morning around 4 AM. She states she's had persistent bilateral shoulder pain for the past 6-7 months. She she took a Flexeril around the 1 AM for the pain. She does not normally take this medication. She awoke and got out of bed to walk to the bathroom. She became lightheaded and diaphoretic and slumped to the floor. She denies any head or neck trauma. She denies any chest pain or shortness of breath was associated with this. She complains only of persistent bilateral neck and shoulder pain. Did not have any focal weakness or numbness. She denies any urinary symptoms or fever. No nausea, vomiting or diarrhea. Past Medical History  Diagnosis Date  . Anxiety   . Hyperlipidemia   . Thyroid disease     hypothyroidism  . Rheumatoid arthritis(714.0)   . Pleurisy with effusion    Past Surgical History  Procedure Laterality Date  . Vaginal hysterectomy  1995  . Tubal ligation    .  laparoscospy    . Colonoscopy     Family History  Problem Relation Age of Onset  . Colon cancer Neg Hx   . Rectal cancer Neg Hx   . Stomach cancer Neg Hx   . Esophageal cancer Neg Hx   . Emphysema Mother   . Rheum arthritis Mother    History  Substance Use Topics  . Smoking status: Former Smoker -- 0.50 packs/day for 44 years    Quit date: 10/26/2009  . Smokeless tobacco: Never Used     Comment: off and on x 44 years 03/14/13  . Alcohol Use: Yes     Comment: occasional   OB History   Grav Para Term Preterm Abortions TAB SAB Ect Mult Living                 Review of Systems  Constitutional: Negative for fever and chills.   HENT: Positive for neck pain. Negative for neck stiffness.   Respiratory: Negative for cough, shortness of breath and wheezing.   Cardiovascular: Negative for chest pain, palpitations and leg swelling.  Gastrointestinal: Negative for nausea, vomiting, abdominal pain and diarrhea.  Genitourinary: Negative for dysuria, frequency and flank pain.  Musculoskeletal: Positive for back pain and arthralgias.  Skin: Negative for rash and wound.  Neurological: Positive for dizziness and light-headedness. Negative for syncope, weakness and numbness.  All other systems reviewed and are negative.    Allergies  Amoxicillin  Home Medications   Current Outpatient Rx  Name  Route  Sig  Dispense  Refill  . albuterol (PROVENTIL HFA;VENTOLIN HFA) 108 (90 BASE) MCG/ACT inhaler   Inhalation   Inhale 2 puffs into the lungs every 6 (six) hours as needed for wheezing.         [name] ALPRAZolam (XANAX) 0.5 MG tablet   Oral   Take 0.5 mg by mouth 3 (three) times daily as needed for sleep or anxiety.          . cyclobenzaprine (FLEXERIL) 10 MG tablet   Oral   Take 10 mg by mouth daily as needed for muscle spasms.         . DULoxetine (CYMBALTA) 60 MG  capsule   Oral   Take 60 mg by mouth every morning.          [name] HYDROcodone-acetaminophen (NORCO/VICODIN) 5-325 MG per tablet   Oral   Take 2 tablets by mouth every 4 (four) hours as needed for pain.   10 tablet   0   . levothyroxine (SYNTHROID, LEVOTHROID) 50 MCG tablet   Oral   Take 1 tablet (50 mcg total) by mouth daily.         . predniSONE (DELTASONE) 10 MG tablet   Oral   Take 5 mg by mouth daily.          . simvastatin (ZOCOR) 20 MG tablet   Oral   Take 20 mg by mouth every evening.         . Tofacitinib Citrate (XELJANZ) 5 MG TABS   Oral   Take 5 mg by mouth 2 (two) times daily.           BP 121/67  Pulse 105  Temp(Src) 97.8 F (36.6 C) (Oral)  Resp 20  Ht 5\' 4"  (1.626 m)  Wt 158 lb (71.668 kg)  BMI 27.11 kg/m2   SpO2 100% Physical Exam  Nursing note and vitals reviewed. Constitutional: She is oriented to person, place, and time. She appears well-developed and well-nourished. No distress.  Anxious appearing  HENT:  Head: Normocephalic and atraumatic.  Mouth/Throat: Oropharynx is clear and moist.  Eyes: EOM are normal. Pupils are equal, round, and reactive to light.  Neck: Normal range of motion. Neck supple.  No posterior cervical spine tenderness to palpation.  Cardiovascular: Normal rate and regular rhythm.   Pulmonary/Chest: Effort normal and breath sounds normal. No respiratory distress. She has no wheezes. She has no rales. She exhibits no tenderness.  Abdominal: Soft. Bowel sounds are normal. She exhibits no distension and no mass. There is no tenderness. There is no rebound and no guarding.  Musculoskeletal: Normal range of motion. She exhibits no edema and no tenderness.  No reproduced thoracic or lumbar muscular tenderness. No midline thoracic or lumbar spinal tenderness full range of motion of all joints. No calf swelling or pain. Distal pulses intact  Neurological: She is alert and oriented to person, place, and time.  Patient is alert and oriented x3 with clear, goal oriented speech. Patient has 5/5 motor in all extremities. Sensation is intact to light touch. Patient has a normal gait and walks without assistance.   Skin: Skin is warm and dry. No rash noted. No erythema.    ED Course  Procedures (including critical care time) Labs Review Labs Reviewed  CBC WITH DIFFERENTIAL  COMPREHENSIVE METABOLIC PANEL  TROPONIN I  URINALYSIS, ROUTINE W REFLEX MICROSCOPIC   Imaging Review No results found.  Date: 07/28/2013  Rate: 78  Rhythm: normal sinus rhythm  QRS Axis: normal  Intervals: normal  ST/T Wave abnormalities: normal  Conduction Disutrbances:none  Narrative Interpretation:   Old EKG Reviewed: unchanged   MDM  Near syncope is likely related to the use of Flexeril. Will  check orthostatics and labs. Patient with mild orthostasis. She is feeling much better after IV fluids. Urine is contaminated sample. She's been advised to followup with her primary doctor for culture results. She denies having any urinary symptoms at this time. Return precautions have been given.  Loren Racer, MD 07/28/13 [phone]

## 2013-07-29 LAB — URINE CULTURE
Colony Count: NO GROWTH
Culture: NO GROWTH

## 2013-08-07 ENCOUNTER — Telehealth: Payer: Self-pay | Admitting: Pulmonary Disease

## 2013-08-07 ENCOUNTER — Ambulatory Visit (INDEPENDENT_AMBULATORY_CARE_PROVIDER_SITE_OTHER)
Admission: RE | Admit: 2013-08-07 | Discharge: 2013-08-07 | Disposition: A | Discharge status: Home / Self Care | Payer: 59 | Source: Ambulatory Visit | Attending: Pulmonary Disease | Admitting: Pulmonary Disease

## 2013-08-07 DIAGNOSIS — R071 Chest pain on breathing: Secondary | ICD-10-CM

## 2013-08-07 DIAGNOSIS — R0781 Pleurodynia: Secondary | ICD-10-CM

## 2013-08-07 MED ORDER — LEVOFLOXACIN 750 MG PO TABS: 750.0000 mg | ORAL_TABLET | Freq: Every day | ORAL | Status: AC

## 2013-08-07 NOTE — Telephone Encounter (Signed)
I spoke with pt. She reports starting yesterday AM she had pain under her left breast (as Dr. Craige Cotta is aware alredy on going thing). She took 7.5 mg of prednisone and ibuprofen yesterday and it eased the pain some. She then woke up early this morning and the pain had moved to right above her breast and pain running straight across her chest. She feels SOB but thinks it is d/t the pain. At 6:30 she took 10 mg of prednisone and it didn't;t notice any difference so she took 10 mg of prednisone again 1 hr later. She wrapped an ACE bandage around her chest to see if that would help. She goes to see rheumatology next week. She is not sure if she needs an OV bc she reports she has had so many CXR and thinks that is all we would do for her. She states if VS wants her to come in then she will but first she would like his recs. Please advise Dr. Craige Cotta thanks  Allergies  Allergen Reactions  . Amoxicillin Other (See Comments)    Pt gets yeast infection

## 2013-08-07 NOTE — Telephone Encounter (Signed)
Dg Chest 2 View  08/07/2013   CLINICAL DATA:  Left chest pain with shortness of breath for 2 days.  EXAM: CHEST  2 VIEW  COMPARISON:  07/28/2013 and 07/21/2013.  FINDINGS: There is new patchy left lower lobe airspace disease with an associated small left pleural effusion. There may be minimal pleural fluid on the right. No edema or pneumothorax is evident. The heart size and mediastinal contours are stable.  IMPRESSION: New left lower lobe airspace disease and adjacent pleural effusion most consistent with pneumonia. Radiographic follow up is recommended.   Electronically Signed   By: Roxy Horseman M.D.   On: 08/07/2013 12:23    Results d/w pt over phone.  Explained she has new findings on CXR that correlate with her symptoms.  She is to continue 20 mg prednisone daily.  Will also have her start levaquin 750 mg daily.    Will have my nurse schedule ROV with CXR later this week with either me or Tammy Parrett.

## 2013-08-07 NOTE — Telephone Encounter (Signed)
I spoke with pt. She stated she is still having pain. She is going to have CXR later this afternoon. Nothing further needed

## 2013-08-07 NOTE — Telephone Encounter (Signed)
If her pain is improved, then she should continue 20 mg prednisone until pain resolved >> then decrease prednisone as tolerated until she is seen by rheumatology.  If her pain is not better with taking 20 mg prednisone, then she should come in for chest xray.

## 2013-08-07 NOTE — Telephone Encounter (Signed)
Pt has been scheduled for 08/09/13 at 4pm per pt's request.

## 2013-08-09 ENCOUNTER — Encounter: Payer: Self-pay | Admitting: Pulmonary Disease

## 2013-08-09 ENCOUNTER — Ambulatory Visit (INDEPENDENT_AMBULATORY_CARE_PROVIDER_SITE_OTHER)
Admission: RE | Admit: 2013-08-09 | Discharge: 2013-08-09 | Disposition: A | Discharge status: Home / Self Care | Payer: 59 | Source: Ambulatory Visit | Attending: Pulmonary Disease | Admitting: Pulmonary Disease

## 2013-08-09 ENCOUNTER — Ambulatory Visit (INDEPENDENT_AMBULATORY_CARE_PROVIDER_SITE_OTHER): Payer: 59 | Admitting: Pulmonary Disease

## 2013-08-09 VITALS — BP 110/68 | HR 87 | Ht 64.0 in | Wt 157.0 lb

## 2013-08-09 DIAGNOSIS — M069 Rheumatoid arthritis, unspecified: Secondary | ICD-10-CM

## 2013-08-09 DIAGNOSIS — R0781 Pleurodynia: Secondary | ICD-10-CM

## 2013-08-09 DIAGNOSIS — R071 Chest pain on breathing: Secondary | ICD-10-CM

## 2013-08-09 NOTE — Progress Notes (Signed)
Chief Complaint  Patient presents with  . Follow-up    Had CXR today. Reports that she is doing much better. Chest pain is much improved. Has tapered down to 10mg  of prednisone.    History of Present Illness: Pamela Ingram is a 62 y.o. female former smoker with hx of rheumatoid arthritis with recurrent pleuritic chest pain and pleural effusions.  She has been feeling better since starting antibiotics and increasing prednisone.  She is not having chest pain.  She is not having cough, wheeze, or sputum.  She is no longer having low grade temperature.  She was seen in ED recently for orthostatic hypotension.  She was not able to make her trip to [place] due to her symptoms.  She is going to follow up with Dr. Clovis Riley in [facility] for rheumatology later this month.  She has not taken xeljanz for the past two days.  TESTS: CT chest 03/13/13 >> small Lt pleural effusion with pleural thickening. Labs 03/23/13 >> RF 41, anti CCP < 2, ANA negative, ESR 81 PFT 05/19/13 >> FEV1 2.32 (91%), FEV1% 82, TLC 4.94 (97%), DLCO 81%, no BD CT chest 07/09/13 >> no PE, Rt > Lt effusions, ATX Rt base  Pamela Ingram  has a past medical history of Anxiety; Hyperlipidemia; Thyroid disease; Rheumatoid arthritis(714.0); and Pleurisy with effusion.  Pamela Ingram  has past surgical history that includes Vaginal hysterectomy (1995); Tubal ligation;  laparoscospy; and Colonoscopy.  Current Outpatient Prescriptions on File Prior to Visit  Medication Sig Dispense Refill  . albuterol (PROVENTIL HFA;VENTOLIN HFA) 108 (90 BASE) MCG/ACT inhaler Inhale 2 puffs into the lungs every 6 (six) hours as needed for wheezing.      [name] ALPRAZolam (XANAX) 0.5 MG tablet Take 0.5 mg by mouth 3 (three) times daily as needed for sleep or anxiety.       . cyclobenzaprine (FLEXERIL) 10 MG tablet Take 10 mg by mouth daily as needed for muscle spasms.      . DULoxetine (CYMBALTA) 60 MG capsule Take 60 mg by mouth every morning.       [name]  HYDROcodone-acetaminophen (NORCO/VICODIN) 5-325 MG per tablet Take 2 tablets by mouth every 4 (four) hours as needed for pain.  10 tablet  0  . levofloxacin (LEVAQUIN) 750 MG tablet Take 1 tablet (750 mg total) by mouth daily.  7 tablet  0  . levothyroxine (SYNTHROID, LEVOTHROID) 50 MCG tablet Take 1 tablet (50 mcg total) by mouth daily.      . predniSONE (DELTASONE) 10 MG tablet Take 5 mg by mouth daily.       . simvastatin (ZOCOR) 20 MG tablet Take 20 mg by mouth every evening.      . Tofacitinib Citrate (XELJANZ) 5 MG TABS Take 5 mg by mouth 2 (two) times daily.        No current facility-administered medications on file prior to visit.     Allergies  Allergen Reactions  . Amoxicillin Other (See Comments)    Pt gets yeast infection     Physical Exam:  General - No distress ENT - No sinus tenderness, no oral exudate, no LAN Cardiac - s1s2 regular, no murmur Chest - no wheeze/rales Back - No focal tenderness Abd - Soft, non-tender Ext - No edema Neuro - Normal strength Skin - No rashes Psych - normal mood, and behavior  Dg Chest 2 View  08/09/2013   CLINICAL DATA:  Left side chest pain below breast, history recurrent arthritis, hyperlipidemia  EXAM: CHEST  2 VIEW  COMPARISON:  08/07/2013  FINDINGS: Upper normal heart size.  Normal mediastinal contours and pulmonary vascularity.  Improved aeration at left base with residual infiltrate or atelectasis and tiny residual left pleural effusion noted.  Remaining lungs appear fairly clear.  No pleural effusion or pneumothorax.  Atherosclerotic calcifications identified.  No acute osseous findings.  IMPRESSION: Improved left basilar aeration.   Electronically Signed   By: Ulyses Southward M.D.   On: 08/09/2013 16:08      Assessment/Plan:  Coralyn Helling, MD [place] [facility]/Critical Care/Sleep Pager:  [phone]

## 2013-08-09 NOTE — Assessment & Plan Note (Signed)
She has recurrent effusions and pleuritic chest pain.  Concern most recently was for pneumonia.  She has clinical improvement with antibiotics and prednisone.

## 2013-08-09 NOTE — Patient Instructions (Signed)
Continue 10 mg prednisone until evaluated by Dr. Clovis Riley Follow up in December 2014

## 2013-08-15 ENCOUNTER — Telehealth: Payer: Self-pay | Admitting: Pulmonary Disease

## 2013-08-15 NOTE — Telephone Encounter (Signed)
Called, spoke with pt. Informed her ok to get flu shot per VS.  She verbalized understanding and voiced no further questions or concerns at this time.

## 2013-08-15 NOTE — Telephone Encounter (Signed)
Okay to get flu shot

## 2013-08-15 NOTE — Telephone Encounter (Signed)
Pt reports she is feeling great. She is not having any pain and no fever. She is wanting to know if Dr. Craige Cotta will approve her to get the flu shot. She reports the levaquin has really helped. Please advise Dr. Craige Cotta thanks

## 2013-08-31 ENCOUNTER — Other Ambulatory Visit: Payer: Self-pay

## 2013-09-11 ENCOUNTER — Ambulatory Visit (INDEPENDENT_AMBULATORY_CARE_PROVIDER_SITE_OTHER): Payer: 59 | Admitting: Family Medicine

## 2013-09-11 ENCOUNTER — Encounter: Payer: Self-pay | Admitting: Family Medicine

## 2013-09-11 VITALS — Ht 64.0 in | Wt 160.1 lb

## 2013-09-11 DIAGNOSIS — M069 Rheumatoid arthritis, unspecified: Secondary | ICD-10-CM

## 2013-09-11 DIAGNOSIS — E663 Overweight: Secondary | ICD-10-CM

## 2013-09-11 DIAGNOSIS — Z6825 Body mass index (BMI) 25.0-25.9, adult: Secondary | ICD-10-CM

## 2013-09-11 DIAGNOSIS — E785 Hyperlipidemia, unspecified: Secondary | ICD-10-CM

## 2013-09-11 NOTE — Progress Notes (Signed)
Medical Nutrition Therapy:  Appt start time: 1100 end time:  1200.  Assessment:  Primary concerns today: Weight management.  Pamela Ingram is tapering down her prednisone for her rheumatoid arthritis.  She will be starting on [place] soon, and hopes to get off prednisone entirely.   She has not been exercising b/c of pain and fatigue; feels exhausted after working all day.  She acknowledges that she does feel better when she keeps moving, which is what she does at work.   Kiyana talked about her h/o disordered eating.  She has spent most of her life worrying about her weight and struggling to control it.  She was very surprised to learn that there are many others with similar food and weight anxieties that often translate into poor food choices.    24-hr recall:  (UP at 7:30 AM) B (8:30 AM)-  3/4 c cooked oatmeal, raisins, 1 tbsp walnuts, coffee w/ creamer Snk ( AM)-  none L (2 PM)-  [employer]: 1/2 croissant w/ avocado, bacon, [place], let, tom; 5 steak fries, 1/2 c fruit, 3 bites of pizza; water Snk ( PM)-  1/4 c M&Ms, water D (6:30 PM)-  3 oz chx breast, 3/4 c green beans, 1 bite potato, 1/2 c pineapple casserole (sugar, flour, cheese, Ritz crackers) Snk ( PM)-  1 c coffee w/ creamer  Progress Towards Goal(s):  In progress.   Nutritional Diagnosis:  NB-2.1 Physical inactivity As related to pain and fatigue.  As evidenced by no regular exercise at this time. [place]-3.3 Overweight/obesity As related to positive energy balance.  As evidenced by BMI of 27.    Intervention:  Nutrition education.  Monitoring/Evaluation:  Dietary intake, exercise, and body weight in 6 week(s).

## 2013-09-11 NOTE — Patient Instructions (Signed)
-   No-salt-added beans:  Go to [employer] for their 365 brand.   - Self-compassion.[email or web address]:  Take the quiz, and email Jeannie your scores (overall & 6 sub-scores).  - Anti-inflammatory diet:  Keep sugar low.  (Aim for a low-glycemic diet.)  Get lots of vegetables and a moderate amount of fruit, as well as a source of protein with each meal.    - Supplements:  ~2000 mg fish oil (EPA & DHA); explore Co-Q 10 & turmeric.  See Jesusita Oka at [facility] for more info on supplements for RA.  - Physical activity: Ask your doctor about the BEST exercise for you right now.   - Weigh to Wellness II starts on Feb 24 (5:30 - 6:30).   Email Jeannie by Feb 18 if you decide to register.

## 2013-09-15 ENCOUNTER — Ambulatory Visit: Payer: 59 | Admitting: Pulmonary Disease

## 2013-10-04 NOTE — Telephone Encounter (Signed)
Dr. Craige Cotta do you still need this message? Carron Curie, CMA

## 2013-10-11 ENCOUNTER — Encounter: Payer: Self-pay | Admitting: Internal Medicine

## 2013-10-11 ENCOUNTER — Ambulatory Visit (INDEPENDENT_AMBULATORY_CARE_PROVIDER_SITE_OTHER): Payer: 59 | Admitting: Internal Medicine

## 2013-10-11 ENCOUNTER — Ambulatory Visit (INDEPENDENT_AMBULATORY_CARE_PROVIDER_SITE_OTHER)
Admission: RE | Admit: 2013-10-11 | Discharge: 2013-10-11 | Disposition: A | Discharge status: Home / Self Care | Payer: 59 | Source: Ambulatory Visit | Attending: Internal Medicine | Admitting: Internal Medicine

## 2013-10-11 ENCOUNTER — Telehealth: Payer: Self-pay | Admitting: Internal Medicine

## 2013-10-11 VITALS — BP 114/60 | HR 100 | Temp 98.1°F | Ht 64.0 in | Wt 160.0 lb

## 2013-10-11 DIAGNOSIS — R0781 Pleurodynia: Secondary | ICD-10-CM

## 2013-10-11 DIAGNOSIS — R071 Chest pain on breathing: Secondary | ICD-10-CM

## 2013-10-11 MED ORDER — PREDNISONE 5 MG PO TABS: ORAL_TABLET | ORAL | Status: DC

## 2013-10-11 MED ORDER — PREDNISOLONE 5 MG PO TABS: 5.0000 mg | ORAL_TABLET | Freq: Every day | ORAL | Status: DC

## 2013-10-11 NOTE — Patient Instructions (Signed)
Increase prednisone to 20 mg per day until pain and cough gone, then try slow taper back to 5 mg per day  For cough, deslym 2 tsp every 12 hours as needed   Please remember to go to the   x-ray department downstairs for your tests - we will call you with the results when they are available.  Call if not better by 12/22 for follow up

## 2013-10-11 NOTE — Progress Notes (Signed)
Chief Complaint  Patient presents with  . Acute Visit    Pt c/o cough and left side CP started this am. Cough is mainly non prod, but can occ produce very minimal amount sputum- color.     History of Present Illness: Pamela Ingram is a 62 y.o. female former smoker with hx of rheumatoid arthritis with recurrent pleuritic chest pain and pleural effusions.  She has been feeling better since starting antibiotics and increasing prednisone.  She is not having chest pain.  She is not having cough, wheeze, or sputum.  She is no longer having low grade temperature.  She was seen in ED recently for orthostatic hypotension.  She was not able to make her trip to [place] due to her symptoms. rec Stay on prednisone 10 until you [name] > taper to 5 (on same dose since early Nov 2014 ) and added humira   10/11/2013 f/u ov/Pamela Ingram re:  Chief Complaint  Patient presents with  . Acute Visit    Pt c/o cough and left side CP started this am. Cough is mainly non prod, but can occ produce very minimal amount sputum- color.   pain worse with cough,ant L chest is  exac same area above L breast as previously, took 2 ibuprofen helped some.    No obvious day to day or daytime variabilty or assoc sob   or chest tightness, subjective wheeze overt sinus or hb symptoms. No unusual exp hx or h/o childhood pna/ asthma or knowledge of premature birth.  Sleeping ok without nocturnal  or early am exacerbation  of respiratory  c/o's or need for noct saba. Also denies any obvious fluctuation of symptoms with weather or environmental changes or other aggravating or alleviating factors except as outlined above   Current Medications, Allergies, Complete Past Medical History, Past Surgical History, Family History, and Social History were reviewed in [employer] record.  ROS  The following are not active complaints unless bolded sore throat, dysphagia, dental problems, itching, sneezing,  nasal congestion or  excess/ purulent secretions, ear ache,   fever, chills, sweats, unintended wt loss,  exertional cp, hemoptysis,  orthopnea pnd or leg swelling, presyncope, palpitations, heartburn, abdominal pain, anorexia, nausea, vomiting, diarrhea  or change in bowel or urinary habits, change in stools or urine, dysuria,hematuria,  rash, arthralgias, visual complaints, headache, numbness weakness or ataxia or problems with walking or coordination,  change in mood/affect or memory.             TESTS: CT chest 03/13/13 >> small Lt pleural effusion with pleural thickening. Labs 03/23/13 >> RF 41, anti CCP < 2, ANA negative, ESR 81 PFT 05/19/13 >> FEV1 2.32 (91%), FEV1% 82, TLC 4.94 (97%), DLCO 81%, no BD CT chest 07/09/13 >> no PE, Rt > Lt effusions, ATX Rt base  Pamela Ingram  has a past medical history of Anxiety; Hyperlipidemia; Thyroid disease; Rheumatoid arthritis(714.0); and Pleurisy with effusion.  Pamela Ingram  has past surgical history that includes Vaginal hysterectomy (1995); Tubal ligation;  laparoscospy; and Colonoscopy.  Current Outpatient Prescriptions on File Prior to Visit  Medication Sig Dispense Refill  . albuterol (PROVENTIL HFA;VENTOLIN HFA) 108 (90 BASE) MCG/ACT inhaler Inhale 2 puffs into the lungs every 6 (six) hours as needed for wheezing.      [name] ALPRAZolam (XANAX) 0.5 MG tablet Take 0.5 mg by mouth 3 (three) times daily as needed for sleep or anxiety.       . DULoxetine (CYMBALTA) 60 MG capsule Take  60 mg by mouth every morning.       [name] levothyroxine (SYNTHROID, LEVOTHROID) 50 MCG tablet Take 1 tablet (50 mcg total) by mouth daily.      . simvastatin (ZOCOR) 20 MG tablet Take 20 mg by mouth every evening.       No current facility-administered medications on file prior to visit.     Allergies  Allergen Reactions  . Amoxicillin Other (See Comments)    Pt gets yeast infection     Physical Exam:  General - No distress ENT - No sinus tenderness, no oral exudate, no LAN Cardiac -  s1s2 regular, no murmur Chest - no wheeze/rales Back - No focal tenderness Abd - Soft, non-tender Ext - No edema Neuro - Normal strength Skin - No rashes Psych - normal mood, and behavior      Dg Chest 2 View  10/11/2013   CLINICAL DATA:  Cough, chest pain  EXAM: CHEST  2 VIEW  COMPARISON:  08/09/2013  FINDINGS: The heart size and mediastinal contours are within normal limits. Both lungs are clear. The visualized skeletal structures are unremarkable.  IMPRESSION: No active cardiopulmonary disease.   Electronically Signed   By: Elige Ko   On: 10/11/2013 15:33

## 2013-10-11 NOTE — Telephone Encounter (Signed)
i spoke with the pharm and clarified RX. Nothing further needed

## 2013-10-11 NOTE — Progress Notes (Signed)
Quick Note:  Spoke with pt and notified of results per Dr. Wert. Pt verbalized understanding and denied any questions.  ______ 

## 2013-10-12 ENCOUNTER — Ambulatory Visit (INDEPENDENT_AMBULATORY_CARE_PROVIDER_SITE_OTHER): Payer: 59 | Admitting: Internal Medicine

## 2013-10-12 ENCOUNTER — Ambulatory Visit: Payer: 59 | Admitting: Internal Medicine

## 2013-10-12 ENCOUNTER — Telehealth: Payer: Self-pay | Admitting: Internal Medicine

## 2013-10-12 ENCOUNTER — Encounter: Payer: Self-pay | Admitting: Internal Medicine

## 2013-10-12 VITALS — BP 132/80 | HR 90 | Temp 98.4°F | Ht 64.0 in | Wt 160.0 lb

## 2013-10-12 DIAGNOSIS — R0781 Pleurodynia: Secondary | ICD-10-CM

## 2013-10-12 DIAGNOSIS — R071 Chest pain on breathing: Secondary | ICD-10-CM

## 2013-10-12 DIAGNOSIS — J069 Acute upper respiratory infection, unspecified: Secondary | ICD-10-CM | POA: Insufficient documentation

## 2013-10-12 MED ORDER — AZITHROMYCIN 250 MG PO TABS: ORAL_TABLET | ORAL | Status: DC

## 2013-10-12 NOTE — Progress Notes (Signed)
Chief Complaint  Patient presents with  . Acute Visit    Pt c/o cough and left side CP started this am. Cough is mainly non prod, but can occ produce very minimal amount sputum- color.     History of Present Illness: Pamela Ingram is a 62 y.o. female former smoker with hx of rheumatoid arthritis with recurrent pleuritic chest pain and pleural effusions.  She has been feeling better since starting antibiotics and increasing prednisone.  She is not having chest pain.  She is not having cough, wheeze, or sputum.  She is no longer having low grade temperature.  She was seen in ED recently for orthostatic hypotension.  She was not able to make her trip to [place] due to her symptoms. rec Stay on prednisone 10 until you Pamela Ingram > taper to 5 (on same dose since early Nov 2014 ) and added humira   10/11/2013 f/u ov/Pamela Ingram re:  Chief Complaint  Patient presents with  . Acute Visit    Pt c/o cough and left side CP started this am. Cough is mainly non prod, but can occ produce very minimal amount sputum- color.   pain worse with cough, pain exac same area above L breast took 2 ibuprofen rec Increase prednisone to 20 mg per day until pain and cough gone, then try slow taper back to 5 mg per day For cough, deslym 2 tsp every 12 hours as needed    10/12/2013 f/u ov/Pamela Ingram re: fever Chief Complaint  Patient presents with  . Acute Visit    Pt states had fever and chills since this am. Also c/o nasal congestion. She states that she feels a "burning in chest".  hacking cough when laughing, new midline subst cp worse with coughing - no purulent secretions Took 20 mg pred today and 2 ibuprofen and the L ant cp resolved as did the fever  No obvious day to day or daytime variabilty or assoc sob  chest tightness, subjective wheeze overt sinus or hb symptoms. No unusual exp hx or h/o childhood pna/ asthma or knowledge of premature birth.  Sleeping ok without nocturnal  or early am exacerbation  of  respiratory  c/o's or need for noct saba. Also denies any obvious fluctuation of symptoms with weather or environmental changes or other aggravating or alleviating factors except as outlined above   Current Medications, Allergies, Complete Past Medical History, Past Surgical History, Family History, and Social History were reviewed in [employer] record.  ROS  The following are not active complaints unless bolded sore throat, dysphagia, dental problems, itching, sneezing,  nasal congestion or excess/ purulent secretions, ear ache,   fever, chills, sweats, unintended wt loss, lateralizing pleuritic or exertional cp, hemoptysis,  orthopnea pnd or leg swelling, presyncope, palpitations, heartburn, abdominal pain, anorexia, nausea, vomiting, diarrhea  or change in bowel or urinary habits, change in stools or urine, dysuria,hematuria,  rash, arthralgias, visual complaints, headache, numbness weakness or ataxia or problems with walking or coordination,  change in mood/affect or memory.             TESTS: CT chest 03/13/13 >> small Lt pleural effusion with pleural thickening. Labs 03/23/13 >> RF 41, anti CCP < 2, ANA negative, ESR 81 PFT 05/19/13 >> FEV1 2.32 (91%), FEV1% 82, TLC 4.94 (97%), DLCO 81%, no BD CT chest 07/09/13 >> no PE, Rt > Lt effusions, ATX Rt base  Pamela Ingram  has a past medical history of Anxiety; Hyperlipidemia; Thyroid disease; Rheumatoid arthritis(714.0);  and Pleurisy with effusion.  Pamela Ingram  has past surgical history that includes Vaginal hysterectomy (1995); Tubal ligation;  laparoscospy; and Colonoscopy.  Current Outpatient Prescriptions on File Prior to Visit  Medication Sig Dispense Refill  . albuterol (PROVENTIL HFA;VENTOLIN HFA) 108 (90 BASE) MCG/ACT inhaler Inhale 2 puffs into the lungs every 6 (six) hours as needed for wheezing.      Pamela Ingram ALPRAZolam (XANAX) 0.5 MG tablet Take 0.5 mg by mouth 3 (three) times daily as needed for sleep or anxiety.        . DULoxetine (CYMBALTA) 60 MG capsule Take 60 mg by mouth every morning.       Pamela Ingram levothyroxine (SYNTHROID, LEVOTHROID) 50 MCG tablet Take 1 tablet (50 mcg total) by mouth daily.      . simvastatin (ZOCOR) 20 MG tablet Take 20 mg by mouth every evening.       No current facility-administered medications on file prior to visit.     Allergies  Allergen Reactions  . Amoxicillin Other (See Comments)    Pt gets yeast infection     Physical Exam:  General - No distress ENT - No sinus tenderness, no oral exudate, no LAN Cardiac - s1s2 regular, no murmur Chest - no wheeze/rales Back - No focal tenderness Abd - Soft, non-tender Ext - No edema Neuro - Normal strength Skin - No rashes Psych - normal mood, and behavior      Dg Chest 2 View  10/11/2013   CLINICAL DATA:  Cough, chest pain  EXAM: CHEST  2 VIEW  COMPARISON:  08/09/2013  FINDINGS: The heart size and mediastinal contours are within normal limits. Both lungs are clear. The visualized skeletal structures are unremarkable.  IMPRESSION: No active cardiopulmonary disease.   Electronically Signed   By: Pamela Ingram   On: 10/11/2013 15:33

## 2013-10-12 NOTE — Assessment & Plan Note (Addendum)
exac same location s any clinica or radiographic indication for infection or effusion.  Relatively unusualy  though for RA to cause pleuritis or painful effusions but nonethless this is probably an RA effect > The goal with a chronic steroid dependent illness is always arriving at the lowest effective dose that controls the disease/symptoms and not accepting a set "formula" which is based on statistics or guidelines that don't always take into account patient  variability or the natural hx of the dz in every individual patient, which may well vary over time.  For now therefore I recommend the patient maintain  A ceiling of 20 mg per day then work back to the floor of 5 mg per day and see if flares again  If worsens on prednisone then clearly infection more likely and advised to call back immediately for worse pain, rigors, fever, sob

## 2013-10-12 NOTE — Assessment & Plan Note (Signed)
zpak  Explained natural history of uri and why it's necessary in patients at risk to treat GERD aggressively  at least  short term   to reduce risk of evolving cyclical cough initially  triggered by epithelial injury and a heightened sensitivty to the effects of any upper airway irritants,  most importantly acid - related.  That is, the more sensitive the epithelium damaged for virus, the more the cough, the more the secondary reflux (especially in those prone to reflux) the more the irritation of the sensitive mucosa and so on in a cyclical pattern.

## 2013-10-12 NOTE — Telephone Encounter (Signed)
I called and spoke with pt. She reports her tamp was 101 this AM 1 hr after taking ibuprofen . She is having a dry hacking cough-making chest hurts. She saw MW 10/12/13 for acute visit. She was put on pre taper 20 mg. Pt also had CXR done. Please advise Dr. Craige Cotta thanks  Allergies  Allergen Reactions  . Amoxicillin Other (See Comments)    Pt gets yeast infection

## 2013-10-12 NOTE — Telephone Encounter (Signed)
i spoke with pt and she is scheduled to come in and see MW at 3:30 today. Nothing further needed

## 2013-10-12 NOTE — Telephone Encounter (Signed)
If fever is new, then she should come in for visit again to assess whether she needs antibiotics also.  Coralyn Helling, MD [facility] Pulmonary/Critical Care 10/12/2013, 9:16 AM Pager:  [phone] After 3pm call: [phone]

## 2013-10-12 NOTE — Patient Instructions (Addendum)
zpak   Try prilosec 20mg   Take 30-60 min before first meal of the day and Pepcid 20 mg one bedtime until cough is completely gone for at least a week without the need for cough suppression  GERD (REFLUX)  is an extremely common cause of respiratory symptoms, many times with no significant heartburn at all.    It can be treated with medication, but also with lifestyle changes including avoidance of late meals, excessive alcohol, smoking cessation, and avoid fatty foods, chocolate, peppermint, colas, red wine, and acidic juices such as orange juice.  NO MINT OR MENTHOL PRODUCTS SO NO COUGH DROPS  USE SUGARLESS CANDY INSTEAD (jolley ranchers or Stover's)  NO OIL BASED VITAMINS - use powdered substitutes.

## 2013-10-12 NOTE — Assessment & Plan Note (Signed)
Improved on prednisone 20 mg per day, continue taper back to 5 mg per day floor as planned

## 2013-10-23 ENCOUNTER — Telehealth: Payer: Self-pay | Admitting: Pulmonary Disease

## 2013-10-23 ENCOUNTER — Other Ambulatory Visit (HOSPITAL_BASED_OUTPATIENT_CLINIC_OR_DEPARTMENT_OTHER): Payer: Self-pay | Admitting: Family Medicine

## 2013-10-23 ENCOUNTER — Ambulatory Visit (HOSPITAL_BASED_OUTPATIENT_CLINIC_OR_DEPARTMENT_OTHER)
Admission: RE | Admit: 2013-10-23 | Discharge: 2013-10-23 | Disposition: A | Discharge status: Home / Self Care | Payer: 59 | Source: Ambulatory Visit | Attending: Family Medicine | Admitting: Family Medicine

## 2013-10-23 DIAGNOSIS — R05 Cough: Secondary | ICD-10-CM

## 2013-10-23 DIAGNOSIS — R059 Cough, unspecified: Secondary | ICD-10-CM | POA: Insufficient documentation

## 2013-10-23 NOTE — Telephone Encounter (Signed)
I spoke with the pt and she states when she saw her rheumatologists she advised her to f/u here if possible because she took zpak that was prescribed on 10-11-13 by MW and the pt is still having a productive cough with green phlegm at times. I advised the pt that we do not have any appts available but I can get message to doc of the day. She states she will call her PCP to see if they can see her this week for f/u to make sure her lungs are fine. I advised the pt to let us know if they cannot see her then we can send a message. Carron Curie, CMA

## 2013-10-31 ENCOUNTER — Ambulatory Visit: Payer: 59 | Admitting: Family Medicine

## 2014-03-17 ENCOUNTER — Encounter (HOSPITAL_BASED_OUTPATIENT_CLINIC_OR_DEPARTMENT_OTHER): Payer: Self-pay | Admitting: Emergency Medicine

## 2014-03-17 ENCOUNTER — Emergency Department (HOSPITAL_BASED_OUTPATIENT_CLINIC_OR_DEPARTMENT_OTHER)
Admission: EM | Admit: 2014-03-17 | Discharge: 2014-03-17 | Disposition: A | Discharge status: Home / Self Care | Payer: 59 | Attending: Emergency Medicine | Admitting: Emergency Medicine

## 2014-03-17 ENCOUNTER — Emergency Department (HOSPITAL_BASED_OUTPATIENT_CLINIC_OR_DEPARTMENT_OTHER): Payer: 59

## 2014-03-17 DIAGNOSIS — Z792 Long term (current) use of antibiotics: Secondary | ICD-10-CM | POA: Insufficient documentation

## 2014-03-17 DIAGNOSIS — Z87891 Personal history of nicotine dependence: Secondary | ICD-10-CM | POA: Insufficient documentation

## 2014-03-17 DIAGNOSIS — M069 Rheumatoid arthritis, unspecified: Secondary | ICD-10-CM | POA: Insufficient documentation

## 2014-03-17 DIAGNOSIS — E785 Hyperlipidemia, unspecified: Secondary | ICD-10-CM | POA: Insufficient documentation

## 2014-03-17 DIAGNOSIS — Z79899 Other long term (current) drug therapy: Secondary | ICD-10-CM | POA: Insufficient documentation

## 2014-03-17 DIAGNOSIS — R079 Chest pain, unspecified: Secondary | ICD-10-CM | POA: Insufficient documentation

## 2014-03-17 DIAGNOSIS — Z880 Allergy status to penicillin: Secondary | ICD-10-CM | POA: Insufficient documentation

## 2014-03-17 DIAGNOSIS — E039 Hypothyroidism, unspecified: Secondary | ICD-10-CM | POA: Insufficient documentation

## 2014-03-17 DIAGNOSIS — F411 Generalized anxiety disorder: Secondary | ICD-10-CM | POA: Insufficient documentation

## 2014-03-17 DIAGNOSIS — Z8709 Personal history of other diseases of the respiratory system: Secondary | ICD-10-CM | POA: Insufficient documentation

## 2014-03-17 LAB — CBC WITH DIFFERENTIAL/PLATELET
Basophils Absolute: 0 10*3/uL (ref 0.0–0.1)
Basophils Relative: 0 % (ref 0–1)
Eosinophils Absolute: 0.2 10*3/uL (ref 0.0–0.7)
Eosinophils Relative: 3 % (ref 0–5)
HCT: 40.3 % (ref 36.0–46.0)
Hemoglobin: 13 g/dL (ref 12.0–15.0)
Lymphocytes Relative: 31 % (ref 12–46)
Lymphs Abs: 2.6 10*3/uL (ref 0.7–4.0)
MCH: 29.1 pg (ref 26.0–34.0)
MCHC: 32.3 g/dL (ref 30.0–36.0)
MCV: 90.2 fL (ref 78.0–100.0)
Monocytes Absolute: 0.4 10*3/uL (ref 0.1–1.0)
Monocytes Relative: 5 % (ref 3–12)
Neutro Abs: 5.2 10*3/uL (ref 1.7–7.7)
Neutrophils Relative %: 62 % (ref 43–77)
Platelets: 417 10*3/uL — ABNORMAL HIGH (ref 150–400)
RBC: 4.47 MIL/uL (ref 3.87–5.11)
RDW: 13 % (ref 11.5–15.5)
WBC: 8.5 10*3/uL (ref 4.0–10.5)

## 2014-03-17 LAB — COMPREHENSIVE METABOLIC PANEL
ALT: 9 U/L (ref 0–35)
AST: 17 U/L (ref 0–37)
Albumin: 3.6 g/dL (ref 3.5–5.2)
Alkaline Phosphatase: 74 U/L (ref 39–117)
BUN: 16 mg/dL (ref 6–23)
CO2: 26 mEq/L (ref 19–32)
Calcium: 9.6 mg/dL (ref 8.4–10.5)
Chloride: 100 mEq/L (ref 96–112)
Creatinine, Ser: 0.8 mg/dL (ref 0.50–1.10)
GFR calc Af Amer: >90 mL/min (ref >90)
GFR calc non Af Amer: 78 mL/min — ABNORMAL LOW (ref >90)
Glucose, Bld: 124 mg/dL — ABNORMAL HIGH (ref 70–99)
Potassium: 4.1 mEq/L (ref 3.7–5.3)
Sodium: 140 mEq/L (ref 137–147)
Total Bilirubin: <0.2 mg/dL — ABNORMAL LOW (ref 0.3–1.2)
Total Protein: 7.6 g/dL (ref 6.0–8.3)

## 2014-03-17 LAB — D-DIMER, QUANTITATIVE: D-Dimer, Quant: 5.94 ug/mL-FEU — ABNORMAL HIGH (ref 0.00–0.48)

## 2014-03-17 LAB — TROPONIN I
Troponin I: <0.3 ng/mL (ref <0.30)
Troponin I: <0.3 ng/mL (ref <0.30)

## 2014-03-17 MED ORDER — OXYCODONE-ACETAMINOPHEN 5-325 MG PO TABS: 1.0000 | ORAL_TABLET | ORAL | Status: DC | PRN

## 2014-03-17 MED ORDER — MORPHINE SULFATE 4 MG/ML IJ SOLN
4.0000 mg | Freq: Once | INTRAMUSCULAR | Status: AC
  Administered 2014-03-17: 4 mg via INTRAVENOUS
  Filled 2014-03-17: qty 1

## 2014-03-17 MED ORDER — IOHEXOL 350 MG/ML SOLN
100.0000 mL | Freq: Once | INTRAVENOUS | Status: AC | PRN
  Administered 2014-03-17: 100 mL via INTRAVENOUS

## 2014-03-17 NOTE — ED Notes (Signed)
Patient c/o sob/pain under her L breast that radiates to her back.tates she ha s had issues with pluerisy and this feels the same

## 2014-03-17 NOTE — ED Provider Notes (Signed)
CSN: [id number]     Arrival date & time 03/17/14  0654 History   First MD Initiated Contact with Patient 03/17/14 [phone]     Chief Complaint  Patient presents with  . Shortness of Breath     (Consider location/radiation/quality/duration/timing/severity/associated sxs/prior Treatment) Patient is a 63 y.o. female presenting with shortness of breath.  Shortness of Breath Severity:  Severe Onset quality:  Sudden Duration:  2 hours Timing:  Constant Progression:  Unchanged Chronicity:  Recurrent Context comment:  Has been treated for pleurisy for past year or so.  This feels similar to that. Relieved by:  Nothing Worsened by:  Deep breathing and movement Ineffective treatments: prilosec, ibuprofen, xanax. Associated symptoms: no abdominal pain, no cough, no fever and no vomiting   Associated symptoms comment:  No dizziness, no diaphoresis   Past Medical History  Diagnosis Date  . Anxiety   . Hyperlipidemia   . Thyroid disease     hypothyroidism  . Rheumatoid arthritis(714.0)   . Pleurisy with effusion    Past Surgical History  Procedure Laterality Date  . Vaginal hysterectomy  1995  . Tubal ligation    .  laparoscospy    . Colonoscopy     Family History  Problem Relation Age of Onset  . Colon cancer Neg Hx   . Rectal cancer Neg Hx   . Stomach cancer Neg Hx   . Esophageal cancer Neg Hx   . Emphysema Mother   . Rheum arthritis Mother    History  Substance Use Topics  . Smoking status: Former Smoker -- 0.50 packs/day for 44 years    Quit date: 10/26/2009  . Smokeless tobacco: Never Used     Comment: off and on x 44 years 03/14/13  . Alcohol Use: Yes     Comment: occasional   OB History   Grav Para Term Preterm Abortions TAB SAB Ect Mult Living                 Review of Systems  Constitutional: Negative for fever.  Respiratory: Positive for shortness of breath. Negative for cough.   Gastrointestinal: Negative for vomiting and abdominal pain.  All other systems  reviewed and are negative.     Allergies  Amoxicillin  Home Medications   Prior to Admission medications   Medication Sig Start Date End Date Taking? Authorizing Provider  Abatacept (ORENCIA Hutchins) Inject into the skin.   Yes Historical Provider, MD  Adalimumab (HUMIRA Maury) 1 injection every 2 wks.    Historical Provider, MD  albuterol (PROVENTIL HFA;VENTOLIN HFA) 108 (90 BASE) MCG/ACT inhaler Inhale 2 puffs into the lungs every 6 (six) hours as needed for wheezing.    Historical Provider, MD  ALPRAZolam Prudy Feeler) 0.5 MG tablet Take 0.5 mg by mouth 3 (three) times daily as needed for sleep or anxiety.  04/05/12   Sanjuana Letters, MD  azithromycin (ZITHROMAX) 250 MG tablet Take 2 on day one then 1 daily x 4 days 10/12/13   Nyoka Cowden, MD  DULoxetine (CYMBALTA) 60 MG capsule Take 60 mg by mouth every morning.  04/05/12   Sanjuana Letters, MD  levothyroxine (SYNTHROID, LEVOTHROID) 50 MCG tablet Take 1 tablet (50 mcg total) by mouth daily. 04/05/12   Sanjuana Letters, MD  predniSONE (DELTASONE) 5 MG tablet Take as directed 10/11/13   Nyoka Cowden, MD  simvastatin (ZOCOR) 20 MG tablet Take 20 mg by mouth every evening.    Historical Provider, MD   BP 107/70  Pulse 92  Temp(Src) 97.7 F (36.5 C) (Oral)  Resp 26  Ht 5\' 4"  (1.626 m)  Wt 158 lb (71.668 kg)  BMI 27.11 kg/m2  SpO2 100% Physical Exam  Nursing note and vitals reviewed. Constitutional: She is oriented to person, place, and time. She appears well-developed and well-nourished. No distress.  Appears uncomfortable  HENT:  Head: Normocephalic and atraumatic.  Mouth/Throat: Oropharynx is clear and moist.  Eyes: Conjunctivae are normal. Pupils are equal, round, and reactive to light. No scleral icterus.  Neck: Neck supple.  Cardiovascular: Normal rate, regular rhythm, normal heart sounds and intact distal pulses.   No murmur heard. Pulmonary/Chest: Effort normal and breath sounds normal. No stridor. No respiratory  distress. She has no rales.    Shallow breathing  Abdominal: Soft. Bowel sounds are normal. She exhibits no distension. There is no tenderness.  Musculoskeletal: Normal range of motion.  Neurological: She is alert and oriented to person, place, and time.  Skin: Skin is warm and dry. No rash noted. She is not diaphoretic.  Psychiatric: She has a normal mood and affect. Her behavior is normal.    ED Course  Procedures (including critical care time) Labs Review Labs Reviewed  CBC WITH DIFFERENTIAL  COMPREHENSIVE METABOLIC PANEL  TROPONIN I  D-DIMER, QUANTITATIVE    Imaging Review Dg Chest 2 View  03/17/2014   CLINICAL DATA:  Chest pain.  EXAM: CHEST  2 VIEW  COMPARISON:  October 23, 2013.  FINDINGS: The heart size and mediastinal contours are within normal limits. Both lungs are clear. No pneumothorax or pleural effusion is noted. The visualized skeletal structures are unremarkable.  IMPRESSION: No acute cardiopulmonary abnormality seen.   Electronically Signed   By: Roque Lias M.D.   On: 03/17/2014 08:24   Ct Angio Chest Pe W/cm &/or Wo Cm  03/17/2014   CLINICAL DATA:  Chest pain and shortness of breath  EXAM: CT ANGIOGRAPHY CHEST WITH CONTRAST  TECHNIQUE: Multidetector CT imaging of the chest was performed using the standard protocol during bolus administration of intravenous contrast. Multiplanar CT image reconstructions and MIPs were obtained to evaluate the vascular anatomy.  CONTRAST:  OMNIPAQUE IOHEXOL 350 MG/ML SOLN  COMPARISON:  Chest x-ray obtained earlier today; most recent prior CT scan of the chest 07/09/2013  FINDINGS: Mediastinum: Unremarkable CT appearance of the thyroid gland. No suspicious mediastinal or hilar adenopathy. No soft tissue mediastinal mass. The thoracic esophagus is unremarkable.  Heart/Vascular: Adequate opacification of the pulmonary arteries to the segmental level. No central filling defect to suggest acute PE. No linear web or vessel wall thickening  to suggest chronic PE. The main and central pulmonary arteries are within normal limits for size. The heart is within normal limits for size. Conventional 3 vessel aortic arch. No aneurysmal dilatation, dissection or significant atherosclerotic vascular disease. Trace calcification is present in the proximal descending thoracic aorta. No pericardial effusion.  Lungs/Pleura: Trace dependent atelectasis versus scarring in the inferior aspect of the middle lobe. Negative for pulmonary edema or pneumothorax. No suspicious pulmonary mass or nodule. Trace left pleural effusion is nonspecific. A small amount of fluid tracks into the fissure. Mild dependent atelectasis in the lower lobes.  Bones/Soft Tissues: No acute fracture or aggressive appearing lytic or blastic osseous lesion.  Upper Abdomen: Incidental note is made of replacement of the lateral segmental branch of the left hepatic artery to the left gastric artery. Otherwise, the visualized upper abdomen is unremarkable.  Review of the MIP images confirms the above  findings.  IMPRESSION: 1. Negative for acute PE. 2. Trace left pleural effusion and associated atelectasis of uncertain etiology.   Electronically Signed   By: Malachy Moan M.D.   On: 03/17/2014 09:41  All radiology studies independently viewed by me.      EKG Interpretation   Date/Time:  Saturday Mar 17 2014 07:21:25 EDT Ventricular Rate:  86 PR Interval:  162 QRS Duration: 82 QT Interval:  374 QTC Calculation: 447 R Axis:   56 Text Interpretation:  Normal sinus rhythm Normal ECG No significant change  was found Confirmed by [facility]  MD, TREY ([id number]) on 03/17/2014 7:47:45 AM      MDM   Final diagnoses:  Chest pain    63 yo female with history of pleurisy presenting with about 2 hours of left sided chest pain.  Sharp, stabbing, constant, severe.  She has had similar symptoms in the past which have been attributed to pleurisy.  ACS unlikely, but hx of hyperlipidemia.  EKG  negative, troponin pending.  PE also unlikely given prior experience with this pain and negative CTA's in the past.  DDimer pending.  IV morphine for pain.    Patient felt much better after IV morphine. CTA showed effusion, but no PE. Delta troponin negative. Plan DC with NSAIDs and Percocet for breakthrough pain. Advised PCP followup.  Candyce Churn III, MD 03/17/14 [phone]

## 2014-03-17 NOTE — Discharge Instructions (Signed)
Chest Pain (Nonspecific) °Chest pain has many causes. Your pain could be caused by something serious, such as a heart attack or a blood clot in the lungs. It could also be caused by something less serious, such as a chest bruise or a virus. Follow up with your doctor. More lab tests or other studies may be needed to find the cause of your pain. Most of the time, nonspecific chest pain will improve within 2 to 3 days of rest and mild pain medicine. °HOME CARE °· For chest bruises, you may put ice on the sore area for 15-20 minutes, 03-04 times a day. Do this only if it makes you feel better. °· Put ice in a plastic bag. °· Place a towel between the skin and the bag. °· Rest for the next 2 to 3 days. °· Go back to work if the pain improves. °· See your doctor if the pain lasts longer than 1 to 2 weeks. °· Only take medicine as told by your doctor. °· Quit smoking if you smoke. °GET HELP RIGHT AWAY IF:  °· There is more pain or pain that spreads to the arm, neck, jaw, back, or belly (abdomen). °· You have shortness of breath. °· You cough more than usual or cough up blood. °· You have very bad back or belly pain, feel sick to your stomach (nauseous), or throw up (vomit). °· You have very bad weakness. °· You pass out (faint). °· You have a fever. °Any of these problems may be serious and may be an emergency. Do not wait to see if the problems will go away. Get medical help right away. Call your local emergency services 911 in [place] Do not drive yourself to the hospital. °MAKE SURE YOU:  °· Understand these instructions. °· Will watch this condition. °· Will get help right away if you or your child is not doing well or gets worse. °Document Released: 03/30/2008 Document Revised: 01/04/2012 Document Reviewed: 03/30/2008 °[employer]® Patient Information ©2014 [employer]. ° °

## 2014-07-05 ENCOUNTER — Other Ambulatory Visit (HOSPITAL_BASED_OUTPATIENT_CLINIC_OR_DEPARTMENT_OTHER): Payer: Self-pay | Admitting: Physician Assistant

## 2014-07-05 DIAGNOSIS — Z1231 Encounter for screening mammogram for malignant neoplasm of breast: Secondary | ICD-10-CM

## 2014-07-12 ENCOUNTER — Other Ambulatory Visit: Payer: Self-pay

## 2014-07-12 ENCOUNTER — Other Ambulatory Visit (INDEPENDENT_AMBULATORY_CARE_PROVIDER_SITE_OTHER): Payer: 59

## 2014-07-12 ENCOUNTER — Other Ambulatory Visit: Payer: Self-pay | Admitting: *Deleted

## 2014-07-12 ENCOUNTER — Telehealth: Payer: Self-pay | Admitting: *Deleted

## 2014-07-12 DIAGNOSIS — R21 Rash and other nonspecific skin eruption: Secondary | ICD-10-CM

## 2014-07-12 LAB — CBC WITH DIFFERENTIAL/PLATELET
Basophils Absolute: 0 10*3/uL (ref 0.0–0.1)
Basophils Relative: 0.4 % (ref 0.0–3.0)
Eosinophils Absolute: 0.1 10*3/uL (ref 0.0–0.7)
Eosinophils Relative: 1.1 % (ref 0.0–5.0)
HCT: 39.9 % (ref 36.0–46.0)
Hemoglobin: 13.1 g/dL (ref 12.0–15.0)
Lymphocytes Relative: 20.3 % (ref 12.0–46.0)
Lymphs Abs: 2 10*3/uL (ref 0.7–4.0)
MCHC: 32.9 g/dL (ref 30.0–36.0)
MCV: 86.4 fl (ref 78.0–100.0)
Monocytes Absolute: 0.2 10*3/uL (ref 0.1–1.0)
Monocytes Relative: 2.1 % — ABNORMAL LOW (ref 3.0–12.0)
Neutro Abs: 7.4 10*3/uL (ref 1.4–7.7)
Neutrophils Relative %: 76.1 % (ref 43.0–77.0)
Platelets: 280 10*3/uL (ref 150.0–400.0)
RBC: 4.62 Mil/uL (ref 3.87–5.11)
RDW: 14.9 % (ref 11.5–15.5)
WBC: 9.7 10*3/uL (ref 4.0–10.5)

## 2014-07-12 NOTE — Telephone Encounter (Signed)
Dr. Arlyce Dice is ordering CBC for patient due to skin rash on both lower legs.  Dr. Arlyce Dice gave verbal orders to order CBC today.

## 2014-07-23 ENCOUNTER — Ambulatory Visit (HOSPITAL_BASED_OUTPATIENT_CLINIC_OR_DEPARTMENT_OTHER)
Admission: RE | Admit: 2014-07-23 | Discharge: 2014-07-23 | Disposition: A | Discharge status: Home / Self Care | Payer: 59 | Source: Ambulatory Visit | Attending: Physician Assistant | Admitting: Physician Assistant

## 2014-07-23 DIAGNOSIS — Z1231 Encounter for screening mammogram for malignant neoplasm of breast: Secondary | ICD-10-CM | POA: Diagnosis present

## 2014-10-06 IMAGING — CR DG CHEST 2V
2 series · 2 of 2 positions shown · non-contrast
Comparison: 04/12/2013 and earlier.

CLINICAL DATA: 61-year-old female acute right chest pain.

CHEST - 2 VIEW

[view not recorded (1 of 2)]
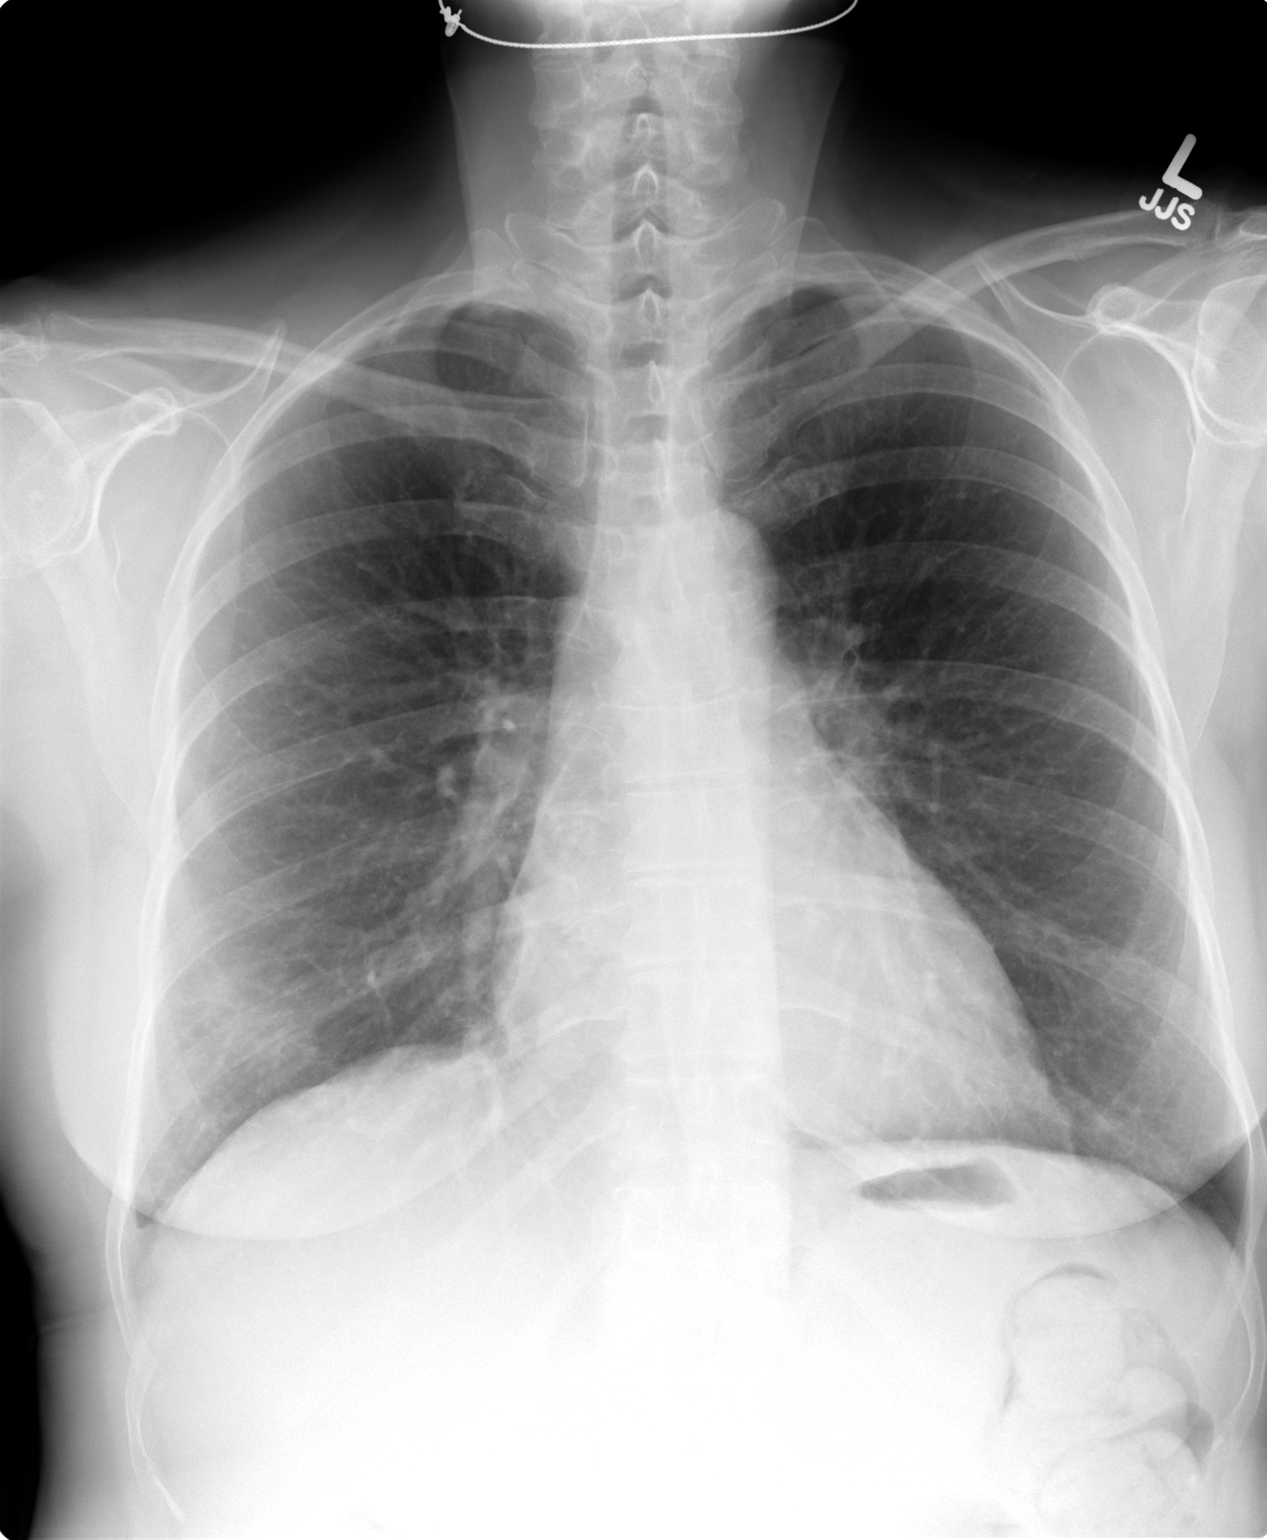

[view not recorded (2 of 2)]
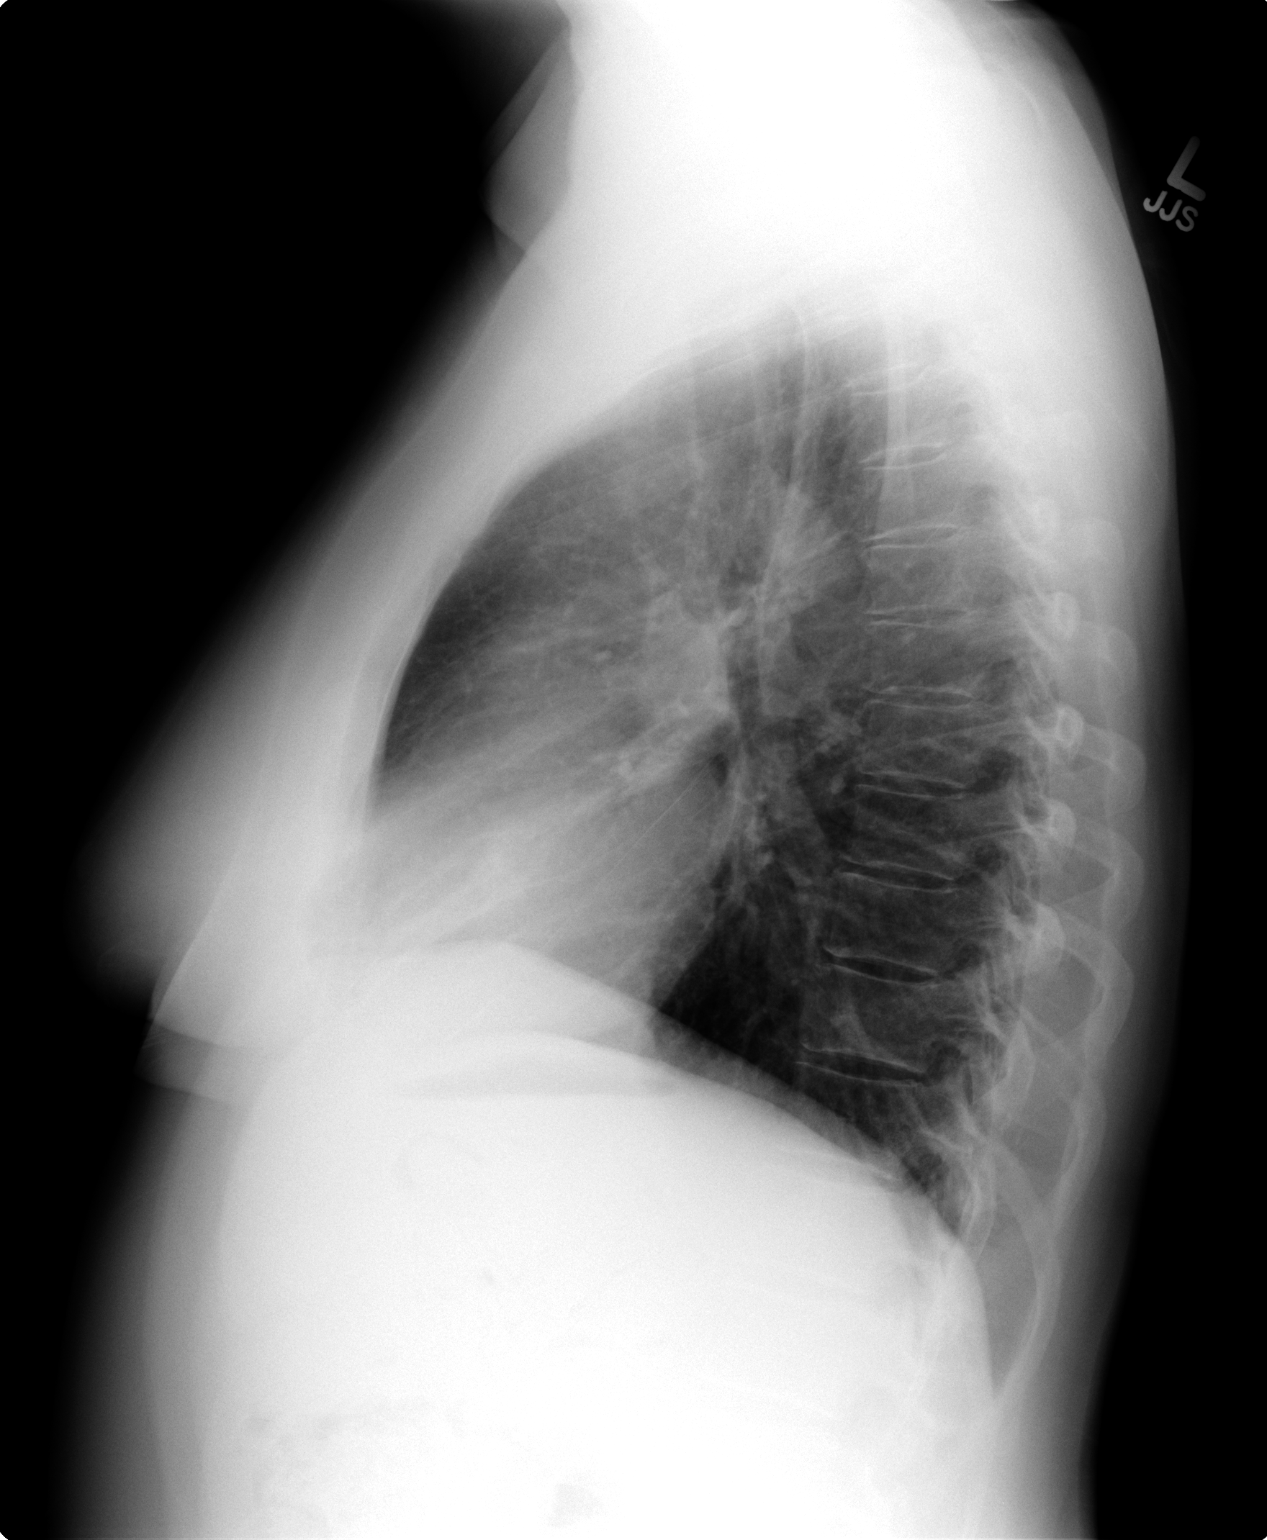

[2 of 2 positions shown; findings below may reference images not displayed]

FINDINGS: Stable lung volumes.  No pneumothorax, pleural effusion
or edema.  Cardiac size and mediastinal contours are within normal
limits.  Visualized tracheal air column is within normal limits.
Mild patchy opacity in the right lower lung on the frontal view,
favor affecting the middle lobe on the lateral.  No other acute or
confluent pulmonary opacity. No acute osseous abnormality
identified.
IMPRESSION: Mild patchy right middle lobe opacity is new since the chest CT on
04/06/2013.  This is nonspecific but might reflect developing
pneumonia.

## 2014-12-19 IMAGING — CR DG CHEST 2V
2 series · 2 of 2 positions shown · non-contrast
Comparison: CT chest and chest radiograph 07/09/2013.

CLINICAL DATA: Cough and chest discomfort.

CHEST - 2 VIEW

[view not recorded (1 of 2)]
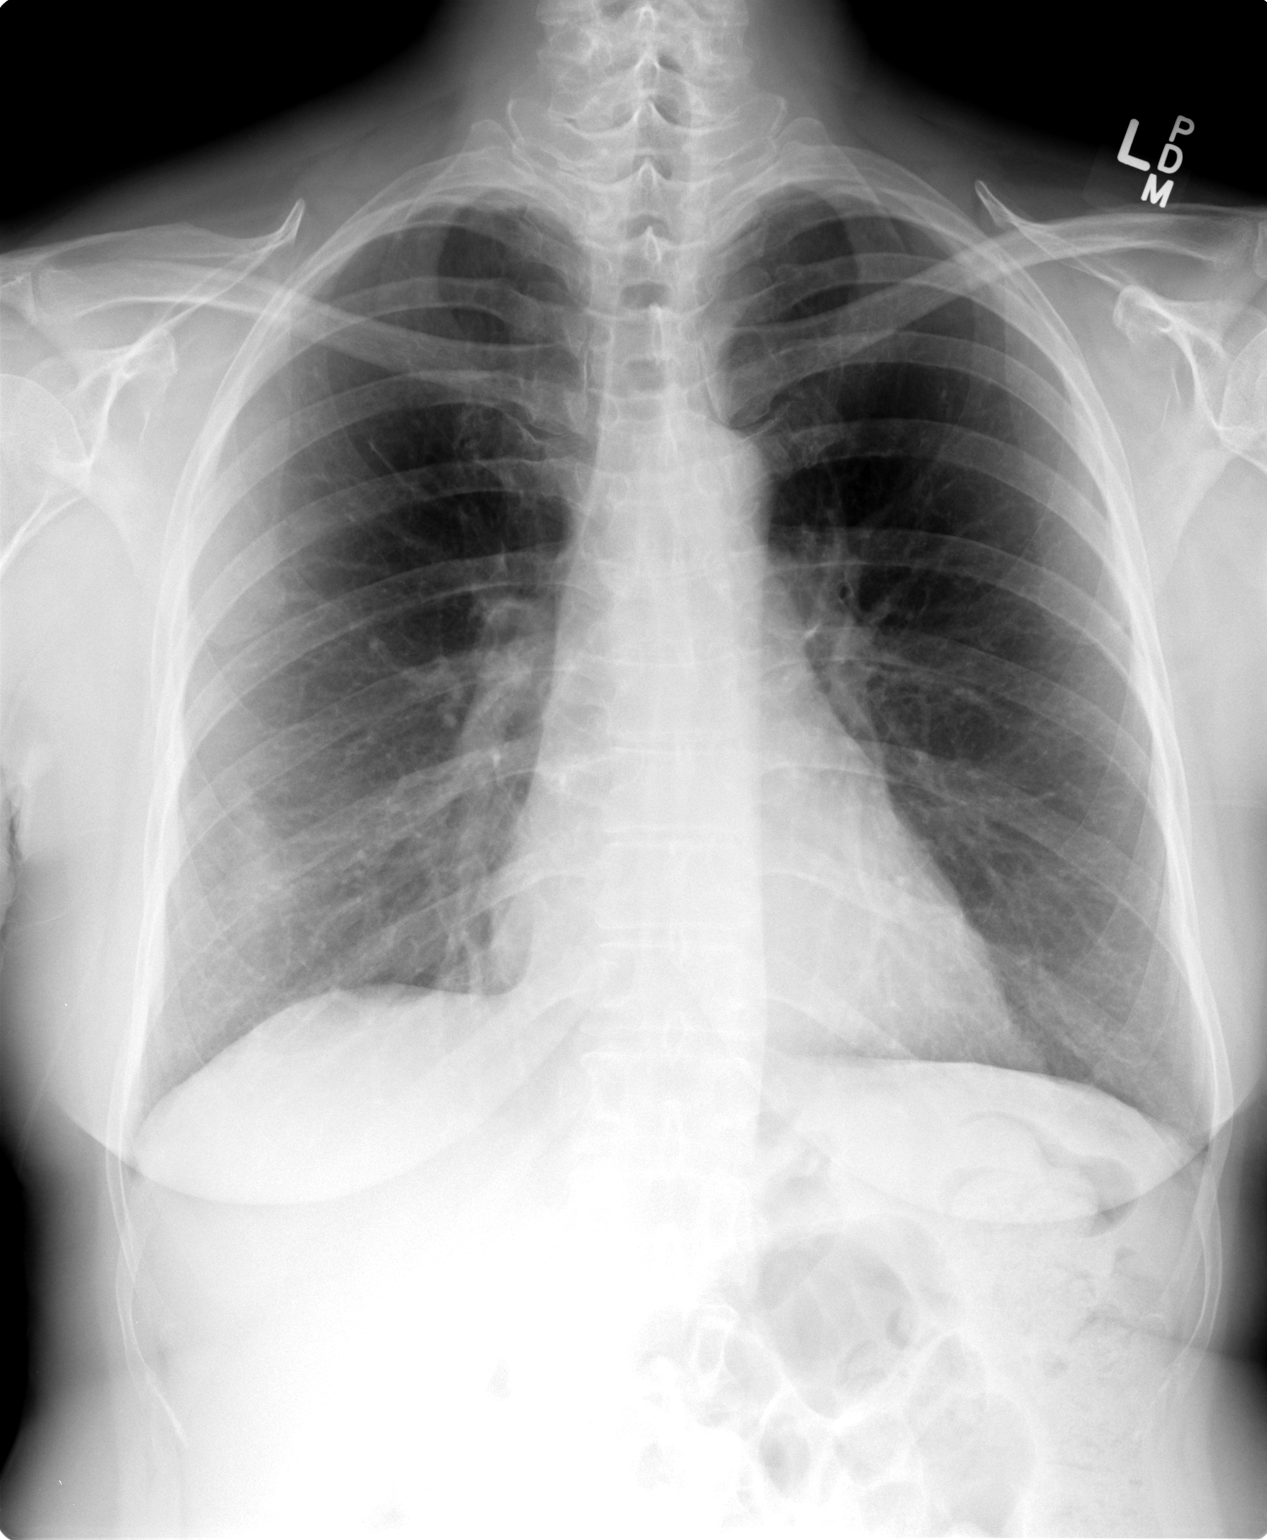

[view not recorded (2 of 2)]
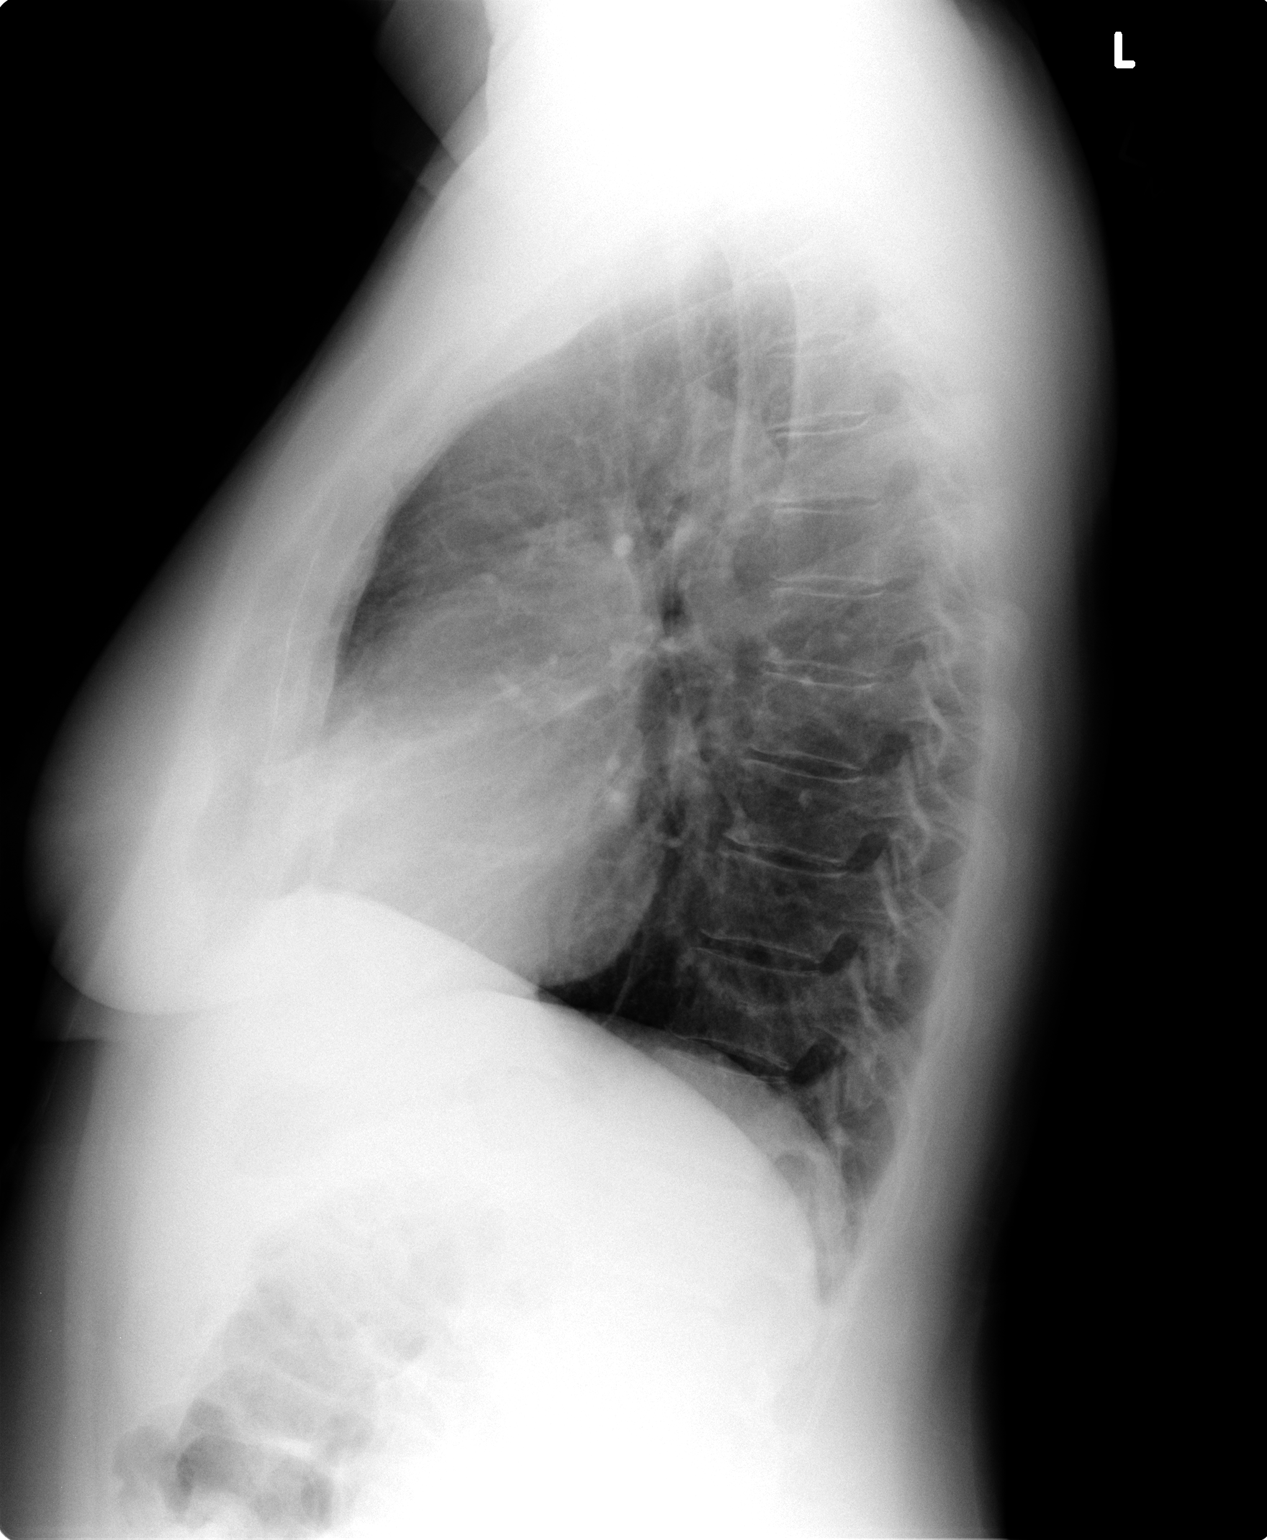

[2 of 2 positions shown; findings below may reference images not displayed]

FINDINGS: Trachea is midline.  Heart size normal.  Interval
clearing of bilateral pleural effusions and bibasilar air space
disease, right greater than left.  Lungs are otherwise clear.
Biapical pleural thickening.
IMPRESSION: Interval clearing of bilateral pleural effusions and bibasilar air
space disease.

## 2015-06-17 ENCOUNTER — Encounter: Payer: Self-pay | Admitting: Obstetrics & Gynecology

## 2015-06-17 ENCOUNTER — Ambulatory Visit (INDEPENDENT_AMBULATORY_CARE_PROVIDER_SITE_OTHER): Payer: 59 | Admitting: Obstetrics & Gynecology

## 2015-06-17 ENCOUNTER — Other Ambulatory Visit: Payer: Self-pay | Admitting: Obstetrics & Gynecology

## 2015-06-17 VITALS — BP 130/80 | HR 72 | Ht 64.0 in | Wt 161.0 lb

## 2015-06-17 DIAGNOSIS — R6882 Decreased libido: Secondary | ICD-10-CM | POA: Diagnosis not present

## 2015-06-17 DIAGNOSIS — N951 Menopausal and female climacteric states: Secondary | ICD-10-CM

## 2015-06-17 DIAGNOSIS — Z1231 Encounter for screening mammogram for malignant neoplasm of breast: Secondary | ICD-10-CM

## 2015-06-17 DIAGNOSIS — Z01419 Encounter for gynecological examination (general) (routine) without abnormal findings: Secondary | ICD-10-CM

## 2015-06-17 MED ORDER — ESTROGENS, CONJUGATED 0.625 MG/GM VA CREA: 1.0000 | TOPICAL_CREAM | Freq: Every day | VAGINAL | Status: DC

## 2015-06-17 MED ORDER — EST ESTROGENS-METHYLTEST 0.625-1.25 MG PO TABS: 1.0000 | ORAL_TABLET | Freq: Every day | ORAL | Status: DC

## 2015-06-17 NOTE — Patient Instructions (Signed)

## 2015-06-17 NOTE — Progress Notes (Signed)
Patient ID: Pamela Ingram, female   DOB: 04/12/1951, 64 y.o.   MRN: [id number] Subjective:     Pamela Ingram is a 64 y.o. female here for a routine exam.  Current complaints: pt reports decreased libido and vaginaI dryness.  Intercourse diffuiculktdifficukt due to lack of lubrication.l dryness.  .  Pt s/p hyst in early 1990's for rectocele repair.   Gynecologic History No LMP recorded. Patient has had a hysterectomy. Contraception: post menopausal status Last Pap: unknown. Results were: normal Last mammogram: 06/2014. Results were: normal  Obstetric History OB History  No data available   Past Medical History  Diagnosis Date  . Anxiety   . Hyperlipidemia   . Thyroid disease     hypothyroidism  . Rheumatoid arthritis(714.0)   . Pleurisy with effusion    Past Surgical History  Procedure Laterality Date  . Vaginal hysterectomy  1995  . Tubal ligation    .  laparoscospy    . Colonoscopy     Current Outpatient Prescriptions on File Prior to Visit  Medication Sig Dispense Refill  . Abatacept (ORENCIA [place]) Inject into the skin.    . Adalimumab (HUMIRA [place]) 1 injection every 2 wks.    . ALPRAZolam (XANAX) 0.5 MG tablet Take 0.5 mg by mouth 3 (three) times daily as needed for sleep or anxiety.     [name] azithromycin (ZITHROMAX) 250 MG tablet Take 2 on day one then 1 daily x 4 days 6 tablet 0  . DULoxetine (CYMBALTA) 60 MG capsule Take 60 mg by mouth every morning.     [name] levothyroxine (SYNTHROID, LEVOTHROID) 50 MCG tablet Take 1 tablet (50 mcg total) by mouth daily.    . predniSONE (DELTASONE) 5 MG tablet Take as directed 100 tablet 0  . simvastatin (ZOCOR) 20 MG tablet Take 20 mg by mouth every evening.     No current facility-administered medications on file prior to visit.    The following portions of the patient's history were reviewed and updated as appropriate: allergies, current medications, past family history, past medical history, past social history, past surgical history and  problem list.  Review of Systems Pertinent items are noted in HPI.    Objective:    BP 130/80 mmHg  Pulse 72  Ht 5\' 4"  (1.626 m)  Wt 161 lb (73.029 kg)  BMI 27.62 kg/m2  General Appearance:    Alert, cooperative, no distress, appears stated age  Head:    Normocephalic, without obvious abnormality, atraumatic  Eyes:    conjunctiva/corneas clear, EOM's intact, both eyes  Ears:    Normal external ear canals, both ears  Nose:   Nares normal, septum midline  Throat:   Lips, mucosa, and tongue normal; teeth and gums normal  Neck:   Supple, symmetrical, trachea midline, no adenopathy;    thyroid:  no enlargement/tenderness/nodules   Back:     Symmetric, no curvature, ROM normal, no CVA tenderness  Lungs:     Clear to auscultation bilaterally, respirations unlabored  Chest Wall:    No tenderness or deformity   Heart:    Regular rate and rhythm, S1 and S2 normal, no murmur, rub   or gallop  Breast Exam:    No tenderness, masses, or nipple abnormality  Abdomen:     Soft, non-tender, bowel sounds active all four quadrants,    no masses, no organomegaly  Genitalia:    Normal female without lesion, discharge or tenderness; vaginal dryness; s/p hyst.  Cuff well heaeld  Extremities:   Extremities normal, atraumatic, no cyanosis or edema  Pulses:   2+ and symmetric all extremities  Skin:   Skin color, texture, turgor normal, no rashes or lesions            Assessment:    Healthy female exam.   Decreased libido Genitourinary syrdome of menopause    Plan:    Follow up in: 3 months.    Estratest HS 1 po g day Premarin cream 1gram qhs x 1 week then 1gm 2-3 x/week for 3 months then stop

## 2015-06-28 ENCOUNTER — Emergency Department (HOSPITAL_COMMUNITY)
Admission: EM | Admit: 2015-06-28 | Discharge: 2015-06-28 | Disposition: A | Discharge status: Home / Self Care | Payer: 59 | Attending: Emergency Medicine | Admitting: Emergency Medicine

## 2015-06-28 ENCOUNTER — Emergency Department (HOSPITAL_COMMUNITY): Payer: 59

## 2015-06-28 ENCOUNTER — Encounter (HOSPITAL_COMMUNITY): Payer: Self-pay | Admitting: Emergency Medicine

## 2015-06-28 DIAGNOSIS — H9401 Acoustic neuritis in infectious and parasitic diseases classified elsewhere, right ear: Secondary | ICD-10-CM | POA: Insufficient documentation

## 2015-06-28 DIAGNOSIS — M069 Rheumatoid arthritis, unspecified: Secondary | ICD-10-CM | POA: Insufficient documentation

## 2015-06-28 DIAGNOSIS — Z87891 Personal history of nicotine dependence: Secondary | ICD-10-CM | POA: Diagnosis not present

## 2015-06-28 DIAGNOSIS — Z8709 Personal history of other diseases of the respiratory system: Secondary | ICD-10-CM | POA: Diagnosis not present

## 2015-06-28 DIAGNOSIS — Z880 Allergy status to penicillin: Secondary | ICD-10-CM | POA: Diagnosis not present

## 2015-06-28 DIAGNOSIS — R51 Headache: Secondary | ICD-10-CM | POA: Diagnosis not present

## 2015-06-28 DIAGNOSIS — Z79899 Other long term (current) drug therapy: Secondary | ICD-10-CM | POA: Diagnosis not present

## 2015-06-28 DIAGNOSIS — H532 Diplopia: Secondary | ICD-10-CM | POA: Diagnosis not present

## 2015-06-28 DIAGNOSIS — E785 Hyperlipidemia, unspecified: Secondary | ICD-10-CM | POA: Diagnosis not present

## 2015-06-28 DIAGNOSIS — H4901 Third [oculomotor] nerve palsy, right eye: Secondary | ICD-10-CM

## 2015-06-28 DIAGNOSIS — E039 Hypothyroidism, unspecified: Secondary | ICD-10-CM | POA: Diagnosis not present

## 2015-06-28 DIAGNOSIS — F419 Anxiety disorder, unspecified: Secondary | ICD-10-CM | POA: Diagnosis not present

## 2015-06-28 DIAGNOSIS — H578 Other specified disorders of eye and adnexa: Secondary | ICD-10-CM | POA: Diagnosis present

## 2015-06-28 DIAGNOSIS — I639 Cerebral infarction, unspecified: Secondary | ICD-10-CM

## 2015-06-28 LAB — URINALYSIS, ROUTINE W REFLEX MICROSCOPIC
Bilirubin Urine: NEGATIVE
Glucose, UA: NEGATIVE mg/dL
Hgb urine dipstick: NEGATIVE
Ketones, ur: NEGATIVE mg/dL
Leukocytes, UA: NEGATIVE
Nitrite: NEGATIVE
Protein, ur: NEGATIVE mg/dL
Specific Gravity, Urine: 1.01 (ref 1.005–1.030)
Urobilinogen, UA: 1 mg/dL (ref 0.0–1.0)
pH: 6.5 (ref 5.0–8.0)

## 2015-06-28 LAB — COMPREHENSIVE METABOLIC PANEL WITH GFR
ALT: 14 U/L (ref 14–54)
AST: 26 U/L (ref 15–41)
Albumin: 4.6 g/dL (ref 3.5–5.0)
Alkaline Phosphatase: 58 U/L (ref 38–126)
Anion gap: 7 (ref 5–15)
BUN: 19 mg/dL (ref 6–20)
CO2: 28 mmol/L (ref 22–32)
Calcium: 9.2 mg/dL (ref 8.9–10.3)
Chloride: 104 mmol/L (ref 101–111)
Creatinine, Ser: 0.78 mg/dL (ref 0.44–1.00)
GFR calc Af Amer: >60 mL/min
GFR calc non Af Amer: >60 mL/min
Glucose, Bld: 83 mg/dL (ref 65–99)
Potassium: 4.1 mmol/L (ref 3.5–5.1)
Sodium: 139 mmol/L (ref 135–145)
Total Bilirubin: 0.7 mg/dL (ref 0.3–1.2)
Total Protein: 7.4 g/dL (ref 6.5–8.1)

## 2015-06-28 LAB — DIFFERENTIAL
Basophils Absolute: 0 10*3/uL (ref 0.0–0.1)
Basophils Relative: 0 % (ref 0–1)
Eosinophils Absolute: 0.4 10*3/uL (ref 0.0–0.7)
Eosinophils Relative: 7 % — ABNORMAL HIGH (ref 0–5)
Lymphocytes Relative: 34 % (ref 12–46)
Lymphs Abs: 2.2 10*3/uL (ref 0.7–4.0)
Monocytes Absolute: 0.3 10*3/uL (ref 0.1–1.0)
Monocytes Relative: 5 % (ref 3–12)
Neutro Abs: 3.5 10*3/uL (ref 1.7–7.7)
Neutrophils Relative %: 54 % (ref 43–77)

## 2015-06-28 LAB — ETHANOL: Alcohol, Ethyl (B): <5 mg/dL

## 2015-06-28 LAB — CBC
HCT: 42 % (ref 36.0–46.0)
Hemoglobin: 13.8 g/dL (ref 12.0–15.0)
MCH: 29.5 pg (ref 26.0–34.0)
MCHC: 32.9 g/dL (ref 30.0–36.0)
MCV: 89.7 fL (ref 78.0–100.0)
Platelets: 228 K/uL (ref 150–400)
RBC: 4.68 MIL/uL (ref 3.87–5.11)
RDW: 13.3 % (ref 11.5–15.5)
WBC: 6.5 K/uL (ref 4.0–10.5)

## 2015-06-28 LAB — I-STAT CHEM 8, ED
BUN: 21 mg/dL — ABNORMAL HIGH (ref 6–20)
Calcium, Ion: 1.13 mmol/L (ref 1.13–1.30)
Chloride: 102 mmol/L (ref 101–111)
Creatinine, Ser: 0.8 mg/dL (ref 0.44–1.00)
Glucose, Bld: 78 mg/dL (ref 65–99)
HCT: 44 % (ref 36.0–46.0)
Hemoglobin: 15 g/dL (ref 12.0–15.0)
Potassium: 4 mmol/L (ref 3.5–5.1)
Sodium: 139 mmol/L (ref 135–145)
TCO2: 25 mmol/L (ref 0–100)

## 2015-06-28 LAB — APTT: aPTT: 26 s (ref 24–37)

## 2015-06-28 LAB — I-STAT TROPONIN, ED: Troponin i, poc: 0 ng/mL (ref 0.00–0.08)

## 2015-06-28 LAB — PROTIME-INR
INR: 0.99 (ref 0.00–1.49)
Prothrombin Time: 13.3 seconds (ref 11.6–15.2)

## 2015-06-28 NOTE — ED Notes (Signed)
Pt reports r/eye "pulling" sensation x 24 hours. Also reports double vision, intermittent since yesterday. Pt is alert, oriented and appropriate. Denies difficulty with ambulation or balance. Pt was started on a new hormone medication 10 days ago. Pt is RN with [employer]

## 2015-06-28 NOTE — ED Provider Notes (Signed)
CSN: [id number]     Arrival date & time 06/28/15  1140 History   First MD Initiated Contact with Patient 06/28/15 1149     Chief Complaint  Patient presents with  . Eye Problem    feels a pulling sensation on r/eye     (Consider location/radiation/quality/duration/timing/severity/associated sxs/prior Treatment) Patient is a 64 y.o. female presenting with Acute Neurological Problem. The history is provided by the patient.  Cerebrovascular Accident This is a new problem. The current episode started yesterday. The problem occurs constantly. The problem has not changed since onset.Associated symptoms include headaches (pressure to head). Pertinent negatives include no chest pain and no shortness of breath. Nothing aggravates the symptoms. Nothing relieves the symptoms. She has tried nothing for the symptoms. The treatment provided no relief.   64 yo F with a chief complaint of problem with her vision. Patient states that she has some difficulty seeing when she looks up. Patient feels that he gets much better when she focuses her gaze downward. Has been able to drive and walk without any difficulty. Patient denies any head injuries. Patient was started on a testosterone estrogen supplementation for menopause about 2 weeks ago.  Patient denies any other neurologic symptoms.   Past Medical History  Diagnosis Date  . Anxiety   . Hyperlipidemia   . Thyroid disease     hypothyroidism  . Rheumatoid arthritis(714.0)   . Pleurisy with effusion    Past Surgical History  Procedure Laterality Date  . Vaginal hysterectomy  1995  . Tubal ligation    .  laparoscospy    . Colonoscopy     Family History  Problem Relation Age of Onset  . Colon cancer Neg Hx   . Rectal cancer Neg Hx   . Stomach cancer Neg Hx   . Esophageal cancer Neg Hx   . Cancer Neg Hx   . Emphysema Mother   . Rheum arthritis Mother   . Hypertension Father   . Stroke Father   . Diabetes Paternal Uncle    Social History   Substance Use Topics  . Smoking status: Former Smoker -- 0.50 packs/day for 44 years    Quit date: 10/26/2009  . Smokeless tobacco: Never Used     Comment: off and on x 44 years 03/14/13  . Alcohol Use: Yes     Comment: occasional   OB History    No data available     Review of Systems  Constitutional: Negative for fever and chills.  HENT: Negative for congestion and rhinorrhea.   Eyes: Positive for visual disturbance (double vision when looks up). Negative for redness.  Respiratory: Negative for shortness of breath and wheezing.   Cardiovascular: Negative for chest pain and palpitations.  Gastrointestinal: Negative for nausea and vomiting.  Genitourinary: Negative for dysuria and urgency.  Musculoskeletal: Negative for myalgias and arthralgias.  Skin: Negative for pallor and wound.  Neurological: Positive for headaches (pressure to head). Negative for dizziness.      Allergies  Amoxicillin  Home Medications   Prior to Admission medications   Medication Sig Start Date End Date Taking? Authorizing Provider  ALPRAZolam Prudy Feeler) 0.5 MG tablet Take 0.5 mg by mouth 3 (three) times daily as needed for sleep or anxiety.  04/05/12  Yes Moses Manners, MD  cetirizine (ZYRTEC) 10 MG tablet Take 10 mg by mouth daily as needed for allergies.    Yes Historical Provider, MD  conjugated estrogens (PREMARIN) vaginal cream Place 1 Applicatorful vaginally at bedtime. 06/17/15  Yes Willodean Rosenthal, MD  DULoxetine (CYMBALTA) 60 MG capsule Take 60 mg by mouth every morning.  04/05/12  Yes Moses Manners, MD  estrogen-methylTESTOSTERone 0.625-1.25 MG per tablet Take 1 tablet by mouth daily. 06/17/15  Yes Willodean Rosenthal, MD  ibuprofen (ADVIL,MOTRIN) 200 MG tablet Take 600-800 mg by mouth 2 (two) times daily as needed for moderate pain.   Yes Historical Provider, MD  levothyroxine (SYNTHROID, LEVOTHROID) 75 MCG tablet Take 75 mcg by mouth daily before breakfast.   Yes Historical  Provider, MD  simvastatin (ZOCOR) 20 MG tablet Take 20 mg by mouth every evening.   Yes Historical Provider, MD  Tocilizumab (ACTEMRA) 162 MG/0.9ML SOSY Inject 162 mg/mL into the skin. Take every other week   Yes Historical Provider, MD   BP 114/55 mmHg  Pulse 71  Temp(Src) 98 F (36.7 C) (Oral)  Resp 18  Wt 161 lb (73.029 kg)  SpO2 98% Physical Exam  Constitutional: She is oriented to person, place, and time. She appears well-developed and well-nourished. No distress.  HENT:  Head: Normocephalic and atraumatic.  Eyes: EOM are normal. Pupils are equal, round, and reactive to light.  Neck: Normal range of motion. Neck supple.  Cardiovascular: Normal rate and regular rhythm.  Exam reveals no gallop and no friction rub.   No murmur heard. Pulmonary/Chest: Effort normal. She has no wheezes. She has no rales.  Abdominal: Soft. She exhibits no distension. There is no tenderness. There is no rebound.  Musculoskeletal: She exhibits no edema or tenderness.  Neurological: She is alert and oriented to person, place, and time. She has normal strength. A cranial nerve deficit is present. No sensory deficit. Coordination and gait normal. GCS eye subscore is 4. GCS verbal subscore is 5. GCS motor subscore is 6. She displays no Babinski's sign on the right side. She displays no Babinski's sign on the left side.  Paralysis of upward gaze of the right eye. Pupils equal and reactive. Patient able to look to the upper right, however looks straight left with her left upward gaze.   Mild right-sided facial droop, possibly chronic per patient  Skin: Skin is warm and dry. She is not diaphoretic.  Psychiatric: She has a normal mood and affect. Her behavior is normal.    ED Course  Procedures (including critical care time) Labs Review Labs Reviewed  DIFFERENTIAL - Abnormal; Notable for the following:    Eosinophils Relative 7 (*)    All other components within normal limits  URINALYSIS, ROUTINE W REFLEX  MICROSCOPIC (NOT AT [facility]) - Abnormal; Notable for the following:    APPearance CLOUDY (*)    All other components within normal limits  I-STAT CHEM 8, ED - Abnormal; Notable for the following:    BUN 21 (*)    All other components within normal limits  ETHANOL  PROTIME-INR  APTT  CBC  COMPREHENSIVE METABOLIC PANEL  URINE RAPID DRUG SCREEN, HOSP PERFORMED  Rosezena Sensor, ED    Imaging Review Mr Maxine Glenn Head Wo Contrast  06/28/2015   CLINICAL DATA:  Right cranial nerve 3 palsy. Intermittent double vision since yesterday.  EXAM: MRI HEAD WITHOUT CONTRAST  MRA HEAD WITHOUT CONTRAST  MRV HEAD WITHOUT CONTRAST  TECHNIQUE: Multiplanar, multiecho pulse sequences of the brain and surrounding structures were obtained without intravenous contrast. Angiographic images of the head were obtained using MRA technique without contrast. Angiographic images of the intracranial venous structures were obtained using MRV technique without intravenous contrast.  COMPARISON:  Head CT 01/02/2013  FINDINGS: MRI HEAD FINDINGS  There is no evidence of acute infarct, intracranial hemorrhage, mass, midline shift, or extra-axial fluid collection. Mild generalized cerebral atrophy is within normal limits for age. No significant white matter disease is seen. A few mildly dilated perivascular spaces are incidentally noted in the right parietal lobe. No gross abnormality is identified in the cavernous sinuses.  Orbits are unremarkable. There is at most minimal bilateral ethmoid air cell mucosal thickening and at most a trace left mastoid effusion. Major intracranial vascular flow voids are preserved.  MRA HEAD FINDINGS  There is mild motion artifact.  The visualized distal vertebral arteries are patent with the left being dominant. PICAs are grossly patent, although the right PICA origin was not imaged. AICA and SCA origins are patent. Basilar artery is patent without stenosis. PCAs are unremarkable. Posterior communicating arteries  are not clearly identified.  Internal carotid arteries are patent from skullbase to carotid termini without stenosis. ACAs and MCAs are unremarkable. Anterior communicating artery is patent. No intracranial aneurysm is identified.  MRV HEAD FINDINGS  Superior sagittal sinus, internal cerebral veins, vein of Galen, straight sinus, transverse sinuses, sigmoid sinuses, and upper internal jugular veins are patent without evidence of thrombus. 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  IMPRESSION: 1. Unremarkable appearance of the brain. No acute intracranial abnormality or mass. 2. Unremarkable head MRA. 3. No evidence of venous sinus thrombosis.   Electronically Signed   By: Sebastian Ache M.D.   On: 06/28/2015 15:32   Mr Brain Wo Contrast  06/28/2015   CLINICAL DATA:  Right cranial nerve 3 palsy. Intermittent double vision since yesterday.  EXAM: MRI HEAD WITHOUT CONTRAST  MRA HEAD WITHOUT CONTRAST  MRV HEAD WITHOUT CONTRAST  TECHNIQUE: Multiplanar, multiecho pulse sequences of the brain and surrounding structures were obtained without intravenous contrast. Angiographic images of the head were obtained using MRA technique without contrast. Angiographic images of the intracranial venous structures were obtained using MRV technique without intravenous contrast.  COMPARISON:  Head CT 01/02/2013  FINDINGS: MRI HEAD FINDINGS  There is no evidence of acute infarct, intracranial hemorrhage, mass, midline shift, or extra-axial fluid collection. Mild generalized cerebral atrophy is within normal limits for age. No significant white matter disease is seen. A few mildly dilated perivascular spaces are incidentally noted in the right parietal lobe. No gross abnormality is identified in the cavernous sinuses.  Orbits are unremarkable. There is at most minimal bilateral ethmoid air cell mucosal thickening and at most a trace left  mastoid effusion. Major intracranial vascular flow voids are preserved.  MRA HEAD FINDINGS  There is mild motion artifact.  The visualized distal vertebral arteries are patent with the left being dominant. PICAs are grossly patent, although the right PICA origin was not imaged. AICA and SCA origins are patent. Basilar artery is patent without stenosis. PCAs are unremarkable. Posterior communicating arteries are not clearly identified.  Internal carotid arteries are patent from skullbase to carotid termini without stenosis. ACAs and MCAs are unremarkable. Anterior communicating artery is patent. No intracranial aneurysm is identified.  MRV HEAD FINDINGS  Superior sagittal sinus, internal cerebral veins, vein of Galen, straight sinus, transverse sinuses, sigmoid sinuses, and upper internal jugular veins are patent without evidence of thrombus. 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  IMPRESSION: 1. Unremarkable appearance of the brain. No acute intracranial abnormality or  mass. 2. Unremarkable head MRA. 3. No evidence of venous sinus thrombosis.   Electronically Signed   By: Sebastian Ache M.D.   On: 06/28/2015 15:32   Mr Mrv Head Wo Cm  06/28/2015   CLINICAL DATA:  Right cranial nerve 3 palsy. Intermittent double vision since yesterday.  EXAM: MRI HEAD WITHOUT CONTRAST  MRA HEAD WITHOUT CONTRAST  MRV HEAD WITHOUT CONTRAST  TECHNIQUE: Multiplanar, multiecho pulse sequences of the brain and surrounding structures were obtained without intravenous contrast. Angiographic images of the head were obtained using MRA technique without contrast. Angiographic images of the intracranial venous structures were obtained using MRV technique without intravenous contrast.  COMPARISON:  Head CT 01/02/2013  FINDINGS: MRI HEAD FINDINGS  There is no evidence of acute infarct, intracranial hemorrhage, mass, midline shift, or extra-axial  fluid collection. Mild generalized cerebral atrophy is within normal limits for age. No significant white matter disease is seen. A few mildly dilated perivascular spaces are incidentally noted in the right parietal lobe. No gross abnormality is identified in the cavernous sinuses.  Orbits are unremarkable. There is at most minimal bilateral ethmoid air cell mucosal thickening and at most a trace left mastoid effusion. Major intracranial vascular flow voids are preserved.  MRA HEAD FINDINGS  There is mild motion artifact.  The visualized distal vertebral arteries are patent with the left being dominant. PICAs are grossly patent, although the right PICA origin was not imaged. AICA and SCA origins are patent. Basilar artery is patent without stenosis. PCAs are unremarkable. Posterior communicating arteries are not clearly identified.  Internal carotid arteries are patent from skullbase to carotid termini without stenosis. ACAs and MCAs are unremarkable. Anterior communicating artery is patent. No intracranial aneurysm is identified.  MRV HEAD FINDINGS  Superior sagittal sinus, internal cerebral veins, vein of Galen, straight sinus, transverse sinuses, sigmoid sinuses, and upper internal jugular veins are patent without evidence of thrombus. 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  IMPRESSION: 1. Unremarkable appearance of the brain. No acute intracranial abnormality or mass. 2. Unremarkable head MRA. 3. No evidence of venous sinus thrombosis.   Electronically Signed   By: Sebastian Ache M.D.   On: 06/28/2015 15:32   I have personally reviewed and evaluated these images and lab results as part of my medical decision-making.   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 Concern for possible stroke versus aneurysm versus venous  sinus thrombosis. We'll obtain an MR of the head. MRI MRV and MRA all negative. Discussed case with Dr. Cyril Mourning.  He feels that there is a possibility of a stroke that was not seen on MRI. However with the current symptoms feel the distribution is very small and therefore would not benefit from the inpatient admission. He feels that starting her on a 81 mg aspirin and having her follow-up with ophthalmology as well as neurology as an outpatient.   4:32 PM:  I have discussed the diagnosis/risks/treatment options with the patient and family and believe the pt to be eligible for discharge home to follow-up with PCP, ophto, neuro. We also discussed returning to the ED immediately if new or worsening sx occur. We discussed the sx which are most concerning (e.g., associated stroke symptoms) that necessitate immediate return. Medications administered to the patient during their visit  and any new prescriptions provided to the patient are listed below.  Medications given during this visit Medications - No data to display  Discharge Medication List as of 06/28/2015  4:04 PM       The patient appears reasonably screen and/or stabilized for discharge and I doubt any other medical condition or other [facility] requiring further screening, evaluation, or treatment in the ED at this time prior to discharge.     Melene Plan, DO 06/28/15 [phone]

## 2015-06-28 NOTE — ED Notes (Addendum)
Nurse drawing labs. 

## 2015-07-05 ENCOUNTER — Ambulatory Visit (INDEPENDENT_AMBULATORY_CARE_PROVIDER_SITE_OTHER): Payer: 59 | Admitting: Neurology

## 2015-07-05 ENCOUNTER — Encounter: Payer: Self-pay | Admitting: Neurology

## 2015-07-05 VITALS — BP 128/62 | HR 88 | Ht 64.0 in

## 2015-07-05 DIAGNOSIS — R739 Hyperglycemia, unspecified: Secondary | ICD-10-CM | POA: Diagnosis not present

## 2015-07-05 DIAGNOSIS — H532 Diplopia: Secondary | ICD-10-CM | POA: Diagnosis not present

## 2015-07-05 DIAGNOSIS — H02401 Unspecified ptosis of right eyelid: Secondary | ICD-10-CM | POA: Diagnosis not present

## 2015-07-05 DIAGNOSIS — H4901 Third [oculomotor] nerve palsy, right eye: Secondary | ICD-10-CM

## 2015-07-05 DIAGNOSIS — Z5181 Encounter for therapeutic drug level monitoring: Secondary | ICD-10-CM | POA: Diagnosis not present

## 2015-07-05 NOTE — Progress Notes (Signed)
Note routed to Dr Elwyn Reach.

## 2015-07-05 NOTE — Progress Notes (Signed)
Pamela Ingram was seen today in neurologic consultation at the request of the ED and Dr. Leone Payor.   The patient presents today for neurologic consultation for diplopia.  Emergency room records were reviewed.  The patient went to the emergency room on 06/28/2015 complaining of vision difficulties.  Pt states that the day before she went to the ED, she felt that her right eye was "tracking inward."  She had some blurry vision.  If she covered an eye (either), the "blurriness" would not get better.   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  ER physician records state that patient had "paralysis of upward gaze of the right eye."  Records also stated that "patient able to look to the upper right, however looks straight left with her left upward gaze."  She had an MRI of the brain without gadolinium that I reviewed that was negative.  She also had an MRA of the brain and MRA of the brain that was negative.  She was told to follow-up with ophthalmology and with neurology; she was told to take baby ASA.   She states that on 8/22 she started on oral estrogen/progesterin and premarin cream and pt thinks that this caused the visual issues.  She states that many years ago she had migraine without headache and had lost vision in the lower visual fields bilaterally but has not had that in a long time.  She states that the difficulty with her vision is completely gone now.  It lasted several days at a max.  She does describe some droopiness of the left eyelid and she initially thought that it was due to cat allergy but states that she knows that should have affected both eyes.  She denies difficulty climbing stairs.  No trouble swallowing.  No SOB.   ALLERGIES:   Allergies  Allergen Reactions  . Amoxicillin Other (See Comments)    Pt gets yeast infection    CURRENT MEDICATIONS:  Outpatient Encounter  Prescriptions as of 07/05/2015  Medication Sig  . ALPRAZolam (XANAX) 0.5 MG tablet Take 0.5 mg by mouth as needed for sleep or anxiety.   [name] aspirin 81 MG tablet Take 81 mg by mouth daily.  . DULoxetine (CYMBALTA) 60 MG capsule Take 60 mg by mouth every morning.   [name] ibuprofen (ADVIL,MOTRIN) 200 MG tablet Take 600-800 mg by mouth 2 (two) times daily as needed for moderate pain.  [name] levothyroxine (SYNTHROID, LEVOTHROID) 75 MCG tablet Take 75 mcg by mouth daily before breakfast.  . simvastatin (ZOCOR) 20 MG tablet Take 20 mg by mouth every evening.  . Tocilizumab (ACTEMRA) 162 MG/0.9ML SOSY Inject 162 mg/mL into the skin. Take every other week  . [DISCONTINUED] cetirizine (ZYRTEC) 10 MG tablet Take 10 mg by mouth daily as needed for allergies.   . [DISCONTINUED] conjugated estrogens (PREMARIN) vaginal cream Place 1 Applicatorful vaginally at bedtime.  . [DISCONTINUED] estrogen-methylTESTOSTERone 0.625-1.25 MG per tablet Take 1 tablet by mouth daily.   No facility-administered encounter medications on file as of 07/05/2015.    PAST MEDICAL HISTORY:   Past Medical History  Diagnosis Date  . Anxiety   . Hyperlipidemia   . Thyroid disease     hypothyroidism  . Rheumatoid arthritis(714.0)   . Pleurisy with effusion     PAST SURGICAL HISTORY:   Past Surgical History  Procedure Laterality Date  . Vaginal hysterectomy  1995  .  Tubal ligation    . Laparoscopy    . Colonoscopy      SOCIAL HISTORY:   Social History   Social History  . Marital Status: Married    Spouse Name: N/A  . Number of Children: 2  . Years of Education: N/A   Occupational History  . NURSE    Social History Main Topics  . Smoking status: Former Smoker -- 0.50 packs/day for 44 years    Quit date: 10/26/2009  . Smokeless tobacco: Never Used     Comment: off and on x 44 years 03/14/13  . Alcohol Use: 0.0 oz/week    0 Standard drinks or equivalent per week     Comment: 1-2 drinks a week  . Drug Use: No  . Sexual  Activity: Yes    Birth Control/ Protection: Surgical   Other Topics Concern  . Not on file   Social History Narrative    FAMILY HISTORY:   Family Status  Relation Status Death Age  . Mother Deceased     rheumatoid arthritis, lyme disease  . Father Deceased     stroke, HTN  . Sister Alive     grave's disease  . Sister Alive     HTN    ROS:  A complete 10 system review of systems was obtained and was unremarkable apart from what is mentioned above.  PHYSICAL EXAMINATION:    VITALS:   Filed Vitals:   07/05/15 0817  BP: 128/62  Pulse: 88  Height: 5\' 4"  (1.626 m)  Refused weight.  GEN:  Normal appears female in no acute distress.  Appears stated age. HEENT:  Normocephalic, atraumatic. The mucous membranes are moist. The superficial temporal arteries are without ropiness or tenderness. Cardiovascular: Regular rate and rhythm. Lungs: Clear to auscultation bilaterally. Neck/Heme: There are no carotid bruits noted bilaterally.  NEUROLOGICAL: Orientation:  The patient is alert and oriented x 3.  Fund of knowledge is appropriate.  Recent and remote memory intact.  Attention span and concentration normal.  Repeats and names without difficulty. 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 The pupils are equal round and reactive to light bilaterally. Fundoscopic exam reveals clear disc margins bilaterally. Extraocular muscles are intact and visual fields are full to confrontational testing.  She is able to hold prolonged upgaze without difficulty.  There is no ptosis noted today.  Speech is fluent and clear. Soft palate rises symmetrically and there is no tongue deviation. Hearing is intact to conversational tone. Tone: Tone is good throughout. Sensation: Sensation is intact to light touch and pinprick throughout (facial, trunk, extremities). Vibration is intact at the bilateral big toe. There is no  extinction with double simultaneous stimulation. There is no sensory dermatomal level identified. Coordination:  The patient has no difficulty with RAM's or FNF bilaterally. Motor: Strength is 5/5 in the bilateral upper and lower extremities.  Shoulder shrug is equal and symmetric. There is no pronator drift.  There are no fasciculations noted. DTR's: Deep tendon reflexes are 2/4 at the bilateral biceps, triceps, brachioradialis, patella and achilles.  Plantar responses are downgoing bilaterally. Gait and Station: The patient is able to ambulate without difficulty. The patient is able to heel toe walk without any difficulty. The patient is able to ambulate in a tandem fashion. The patient is able to stand in the Romberg position.  Lab Results  Component Value Date   WBC 6.5 06/28/2015  HGB 15.0 06/28/2015   HCT 44.0 06/28/2015   MCV 89.7 06/28/2015   PLT 228 06/28/2015   No results found for: TSH    Chemistry      Component Value Date/Time   NA 139 06/28/2015 1308   K 4.0 06/28/2015 1308   CL 102 06/28/2015 1308   CO2 28 06/28/2015 1239   BUN 21* 06/28/2015 1308   CREATININE 0.80 06/28/2015 1308      Component Value Date/Time   CALCIUM 9.2 06/28/2015 1239   ALKPHOS 58 06/28/2015 1239   AST 26 06/28/2015 1239   ALT 14 06/28/2015 1239   BILITOT 0.7 06/28/2015 1239     No results found for: VITAMINB12    IMPRESSION/PLAN  1.  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  She has already had an MRI, MRA and MRV of the brain that were unremarkable.  I reviewed this.  -The patient states that she recently had a fasting cholesterol done and they were normal.  I asked her to get me a copy of that.  -I told her that this was likely ischemic in nature, and I do recommend a  two-hour glucose tolerance test, especially given that her blood sugar was up on labs about a year ago.  I do not see that as tested on her last lab work in the emergency room.  She does think she has had this tested more recently at her primary care provider in [facility].  -I told her that rarely I had seen myasthenia gravis present this way and I think we should go ahead and do acetylcholine receptor antibodies.  If the above is all negative, then I agree with the addition of enteric-coated aspirin, 81 mg daily and taking a wait and see approach.  If anything focal or lateralizing develops in the future, then I absolutely want to see her back for further workup and she expressed understanding.  Greater than 50% of the 60 minute visit spent in counseling and coordinating care.

## 2015-07-05 NOTE — Patient Instructions (Signed)
We would like you to have a two hour glucose tolerance test. This test is performed at [facility] located at [facility] - basement level. Please call [phone] to arrange a convenient time to have testing completed.

## 2015-07-19 ENCOUNTER — Telehealth: Payer: Self-pay

## 2015-07-19 MED ORDER — FLUCONAZOLE 150 MG PO TABS: 150.0000 mg | ORAL_TABLET | Freq: Once | ORAL | Status: DC

## 2015-07-19 MED ORDER — METRONIDAZOLE 500 MG PO TABS: 500.0000 mg | ORAL_TABLET | Freq: Two times a day (BID) | ORAL | Status: DC

## 2015-07-19 NOTE — Telephone Encounter (Signed)
Patient called and is out of town. Patient describes symptoms of some itching and clear discharge. Patient states it does have a "fishy odor". Patient states she has had a yeast infection before but this is not anything like that. Patient made aware that it sound like bacterial vaginosis and we can call her in Flagyl to take by mouth for seven days per protocol. Patient made aware when she returns from the beach next week if symptoms have not been relieved she needs to come into the office for evaluation. Patient states understanding. Patient also concerned that when she takes antibiotics (she does this regularily for dental work) she gets a Insurance risk surveyor infection. Patient made aware I will call her in diflucan in case she starts to get signs of yeast infection.Patient states understanding. Patient states she has taken diflucan before for yeast infection.Armandina Stammer RN BSN

## 2015-07-24 ENCOUNTER — Other Ambulatory Visit: Payer: 59

## 2015-07-24 ENCOUNTER — Encounter: Payer: Self-pay | Admitting: Family Medicine

## 2015-07-24 ENCOUNTER — Ambulatory Visit (INDEPENDENT_AMBULATORY_CARE_PROVIDER_SITE_OTHER): Payer: 59 | Admitting: Family Medicine

## 2015-07-24 VITALS — BP 127/61 | HR 87 | Resp 16 | Ht 64.0 in | Wt 158.0 lb

## 2015-07-24 DIAGNOSIS — N898 Other specified noninflammatory disorders of vagina: Secondary | ICD-10-CM

## 2015-07-24 DIAGNOSIS — L298 Other pruritus: Secondary | ICD-10-CM | POA: Diagnosis not present

## 2015-07-24 DIAGNOSIS — H02401 Unspecified ptosis of right eyelid: Secondary | ICD-10-CM

## 2015-07-24 DIAGNOSIS — L900 Lichen sclerosus et atrophicus: Secondary | ICD-10-CM | POA: Insufficient documentation

## 2015-07-24 MED ORDER — CLOBETASOL PROPIONATE 0.05 % EX CREA: TOPICAL_CREAM | Freq: Two times a day (BID) | CUTANEOUS | Status: DC

## 2015-07-24 NOTE — Progress Notes (Signed)
    Subjective:    Patient ID: Pamela Ingram is a 64 y.o. female presenting with Vaginal Itching  on 07/24/2015  HPI: Here for vaginal itching.  First began 2 wks ago and is very intense.  There is no vaginal discharge associated.  She has spoken to the nurse and tried now 5 days of Flagyl and 2 doses of Diflucan. There continues to be significant ithcing.  Her Rheumatologist checked a Random BS yesterday which was 79 and a HgbA1C which was 5.6. She has a h/o recurrent yeast infections in the past, which were always associated with discharge, but that was when she was premenopausal.  She does have a h/o rheumatoid arthritis and is on an immunologic for the last 2 years.  Review of Systems  Constitutional: Negative for fever and chills.  Respiratory: Negative for shortness of breath.   Cardiovascular: Negative for chest pain.  Gastrointestinal: Negative for nausea, vomiting and abdominal pain.  Genitourinary: Negative for dysuria.  Skin: Negative for rash.      Objective:    BP 127/61 mmHg  Pulse 87  Resp 16  Ht 5\' 4"  (1.626 m)  Wt 158 lb (71.668 kg)  BMI 27.11 kg/m2 Physical Exam  Constitutional: She is oriented to person, place, and time. She appears well-developed and well-nourished. No distress.  HENT:  Head: Normocephalic and atraumatic.  Eyes: No scleral icterus.  Neck: Neck supple.  Cardiovascular: Normal rate.   Pulmonary/Chest: Effort normal.  Abdominal: Soft.  Genitourinary:  Vagina is very pale and has some white areas peri-urethrally and some patchy white spots on the anterior vagina.  There is some white vaginal discharge noted.  Neurological: She is alert and oriented to person, place, and time.  Skin: Skin is warm and dry.  Psychiatric: She has a normal mood and affect.        Assessment & Plan:   Problem List Items Addressed This Visit      Unprioritized   Lichen sclerosus et atrophicus - Primary    Suspect this is the diagnosis, given intense itching  and no real dscharge and treat presumptively with Temovate. Advised to use Use 2x daily x 6 wks, then 1x daily x 6 wks, then qod x 6 wks, then 2x/wk. Will check BD Affirm and treat if we find anything else.      Relevant Medications   clobetasol cream (TEMOVATE) 0.05 %    Other Visit Diagnoses    Vaginal itching        Relevant Orders    WET PREP BY MOLECULAR PROBE       T Return in about 2 months (around 09/23/2015) for a follow-up.  Timaya Bojarski S 07/24/2015 4:04 PM

## 2015-07-24 NOTE — Assessment & Plan Note (Signed)
Suspect this is the diagnosis, given intense itching and no real dscharge and treat presumptively with Temovate. Advised to use Use 2x daily x 6 wks, then 1x daily x 6 wks, then qod x 6 wks, then 2x/wk. Will check BD Affirm and treat if we find anything else.

## 2015-07-24 NOTE — Patient Instructions (Addendum)
Lichen Sclerosus Lichen sclerosus is a skin problem. It can happen on any part of the body, but it commonly involves the anal or genital areas. Lichen sclerosus is not an infection or a fungus. Girls and women are more commonly affected than boys and men. CAUSES The cause is not known. It could be the result of an overactive immune system or a lack of certain hormones. Lichen sclerosus is not passed from one person to another (not contagious). SYMPTOMS Your skin may have:  Thin, wrinkled, white areas.  Thickened white areas.  Red and swollen patches.  Tears or cracks.  Bruising.  Blood blisters.  Severe itching. You may also have pain, itching, or burning with urination. Constipation is also common in people with lichen sclerosus. DIAGNOSIS Your caregiver will do a physical exam. Sometimes, a tissue sample (biopsy) may be sent for testing. TREATMENT Treatment may involve putting a thin layer of medicated cream (topical steroid) over the areas with lichen sclerosus. Use the cream only as directed by your caregiver.  Use 2x daily x 6 wks, then 1x daily x 6 wks, then qod x 6 wks, then 2x/wk  HOME CARE INSTRUCTIONS  Only take over-the-counter or prescription medicines as directed by your caregiver.  Keep the vaginal area as clean and dry as possible. SEEK MEDICAL CARE IF: You develop increasing pain, swelling, or redness. Document Released: 03/04/2011 Document Revised: 01/04/2012 Document Reviewed: 03/04/2011 [facility] Patient Information 2015 [place], [place]. This information is not intended to replace advice given to you by your health care provider. Make sure you discuss any questions you have with your health care provider.

## 2015-07-25 ENCOUNTER — Other Ambulatory Visit: Payer: Self-pay | Admitting: *Deleted

## 2015-07-25 ENCOUNTER — Telehealth: Payer: Self-pay | Admitting: Neurology

## 2015-07-25 DIAGNOSIS — H532 Diplopia: Secondary | ICD-10-CM

## 2015-07-25 DIAGNOSIS — H02401 Unspecified ptosis of right eyelid: Secondary | ICD-10-CM

## 2015-07-25 DIAGNOSIS — H4901 Third [oculomotor] nerve palsy, right eye: Secondary | ICD-10-CM

## 2015-07-25 LAB — WET PREP BY MOLECULAR PROBE
Candida species: NEGATIVE
Gardnerella vaginalis: POSITIVE — AB
Trichomonas vaginosis: NEGATIVE

## 2015-07-25 LAB — MYASTHENIA GRAVIS PANEL 2

## 2015-07-25 NOTE — Telephone Encounter (Signed)
[employer] and they said that the wrong specimen was sent.  Called patient and requested that she go to the lab for another test.  She agreed.  She does not want to do GTT since her A1C was checked and was good.

## 2015-07-25 NOTE — Telephone Encounter (Signed)
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  She will likely need to recollect

## 2015-07-29 ENCOUNTER — Ambulatory Visit (HOSPITAL_BASED_OUTPATIENT_CLINIC_OR_DEPARTMENT_OTHER)
Admission: RE | Admit: 2015-07-29 | Discharge: 2015-07-29 | Disposition: A | Discharge status: Home / Self Care | Payer: 59 | Source: Ambulatory Visit | Attending: Obstetrics & Gynecology | Admitting: Obstetrics & Gynecology

## 2015-07-29 DIAGNOSIS — Z1231 Encounter for screening mammogram for malignant neoplasm of breast: Secondary | ICD-10-CM | POA: Diagnosis present

## 2015-11-01 MED FILL — FLUCONAZOLE 150 MG TABLET: 150 | 2 days supply | Qty: 2 | Fill #0

## 2015-11-01 MED FILL — HYDROCODON-APAP 7.5-325: 7.5-325 | 3 days supply | Qty: 20 | Fill #0

## 2015-11-01 MED FILL — ONDANSETRON ODT 4 MG TABLET: 4 | 3 days supply | Qty: 12 | Fill #0

## 2015-11-01 MED FILL — CEPHALEXIN 500 MG CAPSULE: 500 | 7 days supply | Qty: 31 | Fill #0

## 2015-11-13 MED FILL — VIT D2 1.25 MG (50,000 UNIT: 1.25 MG | 35 days supply | Qty: 10 | Fill #4

## 2015-11-13 MED FILL — LEVOTHYROXINE 75 MCG TABLET: 75 | 30 days supply | Qty: 30 | Fill #2

## 2015-11-13 MED FILL — CEPHALEXIN 500 MG CAPSULE: 500 | 5 days supply | Qty: 20 | Fill #0

## 2015-12-03 DIAGNOSIS — Z79899 Other long term (current) drug therapy: Secondary | ICD-10-CM | POA: Diagnosis not present

## 2015-12-12 MED FILL — VIT D2 1.25 MG (50,000 UNIT: 1.25 MG | 35 days supply | Qty: 10 | Fill #5

## 2015-12-12 MED FILL — ACTEMRA 162 MG/0.9 ML SYRN: 162 | 90 days supply | Qty: 5 | Fill #1

## 2015-12-12 MED FILL — LEVOTHYROXINE 75 MCG TABLET: 75 | 30 days supply | Qty: 30 | Fill #3

## 2016-01-16 MED FILL — LEVOTHYROXINE 75 MCG TABLET: 75 | 30 days supply | Qty: 30 | Fill #0

## 2016-01-27 MED FILL — DULoxetine HCL 60 MG CPEP: 60 | 90 days supply | Qty: 90 | Fill #3

## 2016-01-27 MED FILL — VIT D2 1.25 MG (50,000 UNIT: 1.25 MG | 35 days supply | Qty: 10 | Fill #0

## 2016-02-13 DIAGNOSIS — Z79899 Other long term (current) drug therapy: Secondary | ICD-10-CM | POA: Diagnosis not present

## 2016-02-14 MED FILL — LEVOTHYROXINE 75 MCG TABLET: 75 | 30 days supply | Qty: 30 | Fill #1

## 2016-02-21 DIAGNOSIS — M0579 Rheumatoid arthritis with rheumatoid factor of multiple sites without organ or systems involvement: Secondary | ICD-10-CM | POA: Diagnosis not present

## 2016-02-21 DIAGNOSIS — Z79899 Other long term (current) drug therapy: Secondary | ICD-10-CM | POA: Diagnosis not present

## 2016-03-05 MED FILL — SIMVASTATIN 20 MG TABLET: 20 | 90 days supply | Qty: 90 | Fill #0

## 2016-03-05 MED FILL — VIT D2 1.25 MG (50,000 UNIT: 1.25 MG | 35 days supply | Qty: 10 | Fill #1

## 2016-03-19 MED FILL — LEVOTHYROXINE 75 MCG TABLET: 75 | 30 days supply | Qty: 30 | Fill #2

## 2016-04-07 MED FILL — ACTEMRA 162 MG/0.9 ML SYRN: 162 | 84 days supply | Qty: 5 | Fill #2

## 2016-04-13 DIAGNOSIS — Z79899 Other long term (current) drug therapy: Secondary | ICD-10-CM | POA: Diagnosis not present

## 2016-04-16 MED FILL — VIT D2 1.25 MG (50,000 UNIT: 1.25 MG | 35 days supply | Qty: 10 | Fill #2

## 2016-04-16 MED FILL — LEVOTHYROXINE 75 MCG TABLET: 75 | 30 days supply | Qty: 30 | Fill #3

## 2016-05-01 MED FILL — DULoxetine HCL 60 MG CPEP: 60 | 90 days supply | Qty: 90 | Fill #0

## 2016-05-21 MED FILL — LEVOTHYROXINE 75 MCG TABLET: 75 | 30 days supply | Qty: 30 | Fill #0

## 2016-05-29 DIAGNOSIS — F419 Anxiety disorder, unspecified: Secondary | ICD-10-CM | POA: Insufficient documentation

## 2016-05-29 DIAGNOSIS — M8580 Other specified disorders of bone density and structure, unspecified site: Secondary | ICD-10-CM | POA: Insufficient documentation

## 2016-05-29 DIAGNOSIS — K219 Gastro-esophageal reflux disease without esophagitis: Secondary | ICD-10-CM | POA: Insufficient documentation

## 2016-06-01 DIAGNOSIS — E785 Hyperlipidemia, unspecified: Secondary | ICD-10-CM | POA: Diagnosis not present

## 2016-06-01 DIAGNOSIS — F419 Anxiety disorder, unspecified: Secondary | ICD-10-CM | POA: Diagnosis not present

## 2016-06-01 DIAGNOSIS — E039 Hypothyroidism, unspecified: Secondary | ICD-10-CM | POA: Diagnosis not present

## 2016-06-01 DIAGNOSIS — Z0000 Encounter for general adult medical examination without abnormal findings: Secondary | ICD-10-CM | POA: Diagnosis not present

## 2016-06-25 MED FILL — LEVOTHYROXINE 75 MCG TABLET: 75 | 30 days supply | Qty: 30 | Fill #1

## 2016-06-25 MED FILL — ALPRAZolam 0.5 MG TABS: 0.5 | 30 days supply | Qty: 30 | Fill #0

## 2016-06-25 MED FILL — VIT D2 1.25 MG (50,000 UNIT: 1.25 MG | 35 days supply | Qty: 10 | Fill #3

## 2016-07-06 MED FILL — SIMVASTATIN 20 MG TABLET: 20 | 90 days supply | Qty: 90 | Fill #1

## 2016-07-23 DIAGNOSIS — M0579 Rheumatoid arthritis with rheumatoid factor of multiple sites without organ or systems involvement: Secondary | ICD-10-CM | POA: Diagnosis not present

## 2016-07-23 DIAGNOSIS — Z79899 Other long term (current) drug therapy: Secondary | ICD-10-CM | POA: Diagnosis not present

## 2016-07-27 ENCOUNTER — Other Ambulatory Visit (HOSPITAL_BASED_OUTPATIENT_CLINIC_OR_DEPARTMENT_OTHER): Payer: Self-pay | Admitting: Family Medicine

## 2016-07-27 DIAGNOSIS — Z1231 Encounter for screening mammogram for malignant neoplasm of breast: Secondary | ICD-10-CM

## 2016-07-30 MED FILL — LEVOTHYROXINE 75 MCG TABLET: 75 | 30 days supply | Qty: 30 | Fill #2

## 2016-08-03 MED FILL — ACTEMRA 162 MG/0.9 ML SYRN: 162 | 84 days supply | Qty: 5 | Fill #0

## 2016-08-06 ENCOUNTER — Ambulatory Visit (HOSPITAL_BASED_OUTPATIENT_CLINIC_OR_DEPARTMENT_OTHER)
Admission: RE | Admit: 2016-08-06 | Discharge: 2016-08-06 | Disposition: A | Discharge status: Home / Self Care | Payer: 59 | Source: Ambulatory Visit | Attending: Family Medicine | Admitting: Family Medicine

## 2016-08-06 DIAGNOSIS — Z1231 Encounter for screening mammogram for malignant neoplasm of breast: Secondary | ICD-10-CM | POA: Insufficient documentation

## 2016-08-06 MED FILL — DULoxetine HCL 60 MG CPEP: 60 | 90 days supply | Qty: 90 | Fill #0

## 2016-08-06 MED FILL — VIT D2 1.25 MG (50,000 UNIT: 1.25 MG | 35 days supply | Qty: 10 | Fill #4

## 2016-08-10 MED FILL — CEPHALEXIN 500 MG CAPSULE: 500 | 7 days supply | Qty: 28 | Fill #0

## 2016-08-12 DIAGNOSIS — R945 Abnormal results of liver function studies: Secondary | ICD-10-CM | POA: Diagnosis not present

## 2016-08-24 DIAGNOSIS — M0579 Rheumatoid arthritis with rheumatoid factor of multiple sites without organ or systems involvement: Secondary | ICD-10-CM | POA: Diagnosis not present

## 2016-08-24 DIAGNOSIS — Z79899 Other long term (current) drug therapy: Secondary | ICD-10-CM | POA: Diagnosis not present

## 2016-09-03 MED FILL — LEVOTHYROXINE 75 MCG TABLET: 75 | 30 days supply | Qty: 30 | Fill #3

## 2016-09-21 DIAGNOSIS — Z79899 Other long term (current) drug therapy: Secondary | ICD-10-CM | POA: Diagnosis not present

## 2016-10-06 MED FILL — SIMVASTATIN 20 MG TABLET: 20 | 90 days supply | Qty: 90 | Fill #0

## 2016-10-06 MED FILL — LEVOTHYROXINE 75 MCG TABLET: 75 | 90 days supply | Qty: 90 | Fill #0

## 2016-10-06 MED FILL — VIT D2 1.25 MG (50,000 UNIT: 1.25 MG | 35 days supply | Qty: 10 | Fill #5

## 2016-11-09 MED FILL — VIT D2 1.25 MG (50,000 UNIT: 1.25 MG | 35 days supply | Qty: 10 | Fill #0

## 2016-11-09 MED FILL — ACTEMRA 162 MG/0.9 ML SYRN: 162 | 28 days supply | Qty: 2 | Fill #1

## 2016-11-09 MED FILL — DULoxetine HCL 60 MG CPEP: 60 | 90 days supply | Qty: 90 | Fill #1

## 2016-11-30 DIAGNOSIS — Z79899 Other long term (current) drug therapy: Secondary | ICD-10-CM | POA: Diagnosis not present

## 2016-12-07 MED FILL — ACTEMRA 162 MG/0.9 ML SYRN: 162 | 28 days supply | Qty: 2 | Fill #2

## 2016-12-21 DIAGNOSIS — M0579 Rheumatoid arthritis with rheumatoid factor of multiple sites without organ or systems involvement: Secondary | ICD-10-CM | POA: Diagnosis not present

## 2016-12-21 DIAGNOSIS — Z79899 Other long term (current) drug therapy: Secondary | ICD-10-CM | POA: Diagnosis not present

## 2016-12-31 MED FILL — ACTEMRA 162 MG/0.9 ML SYRN: 162 | 28 days supply | Qty: 2 | Fill #3

## 2017-01-04 MED FILL — VIT D2 1.25 MG (50,000 UNIT: 1.25 MG | 35 days supply | Qty: 10 | Fill #1

## 2017-01-14 MED FILL — LEVOTHYROXINE 75 MCG TABLET: 75 | 90 days supply | Qty: 90 | Fill #1

## 2017-01-31 DIAGNOSIS — R079 Chest pain, unspecified: Secondary | ICD-10-CM | POA: Diagnosis not present

## 2017-02-01 MED FILL — ACTEMRA 162 MG/0.9 ML SYRN: 162 | 28 days supply | Qty: 2 | Fill #4

## 2017-02-02 ENCOUNTER — Other Ambulatory Visit (HOSPITAL_BASED_OUTPATIENT_CLINIC_OR_DEPARTMENT_OTHER): Payer: Self-pay | Admitting: *Deleted

## 2017-02-02 ENCOUNTER — Ambulatory Visit (HOSPITAL_BASED_OUTPATIENT_CLINIC_OR_DEPARTMENT_OTHER)
Admission: RE | Admit: 2017-02-02 | Discharge: 2017-02-02 | Disposition: A | Discharge status: Home / Self Care | Payer: 59 | Source: Ambulatory Visit | Attending: *Deleted | Admitting: *Deleted

## 2017-02-02 DIAGNOSIS — R079 Chest pain, unspecified: Secondary | ICD-10-CM | POA: Insufficient documentation

## 2017-02-02 DIAGNOSIS — R0789 Other chest pain: Secondary | ICD-10-CM | POA: Diagnosis not present

## 2017-02-04 DIAGNOSIS — Z79899 Other long term (current) drug therapy: Secondary | ICD-10-CM | POA: Diagnosis not present

## 2017-02-08 MED FILL — ALPRAZolam 0.5 MG TABS: 0.5 | 30 days supply | Qty: 30 | Fill #0

## 2017-02-15 MED FILL — DULoxetine HCL 60 MG CPEP: 60 | 90 days supply | Qty: 90 | Fill #0

## 2017-02-16 ENCOUNTER — Telehealth: Payer: Self-pay | Admitting: Cardiovascular Disease

## 2017-02-16 NOTE — Telephone Encounter (Signed)
Received records from [facility] for appointment on 03/18/17 with Dr Royann Shivers.  Records put with Dr Croitoru's schedule for 03/18/17. lp

## 2017-02-17 DIAGNOSIS — L219 Seborrheic dermatitis, unspecified: Secondary | ICD-10-CM | POA: Diagnosis not present

## 2017-02-17 DIAGNOSIS — L409 Psoriasis, unspecified: Secondary | ICD-10-CM | POA: Diagnosis not present

## 2017-02-17 MED FILL — KETOCONAZOLE 2% SHAMPOO: 2 | 30 days supply | Qty: 120 | Fill #0

## 2017-02-17 MED FILL — CLOBETASOL 0.05% SOLUTION: 0.05 | 10 days supply | Qty: 25 | Fill #0

## 2017-03-01 MED FILL — VIT D2 1.25 MG (50,000 UNIT: 1.25 MG | 35 days supply | Qty: 10 | Fill #2

## 2017-03-01 MED FILL — SIMVASTATIN 20 MG TABLET: 20 | 90 days supply | Qty: 90 | Fill #1

## 2017-03-10 MED FILL — ACTEMRA 162 MG/0.9 ML SYRN: 162 | 28 days supply | Qty: 2 | Fill #5

## 2017-03-18 ENCOUNTER — Encounter: Payer: Self-pay | Admitting: Cardiovascular Disease

## 2017-03-18 ENCOUNTER — Ambulatory Visit (INDEPENDENT_AMBULATORY_CARE_PROVIDER_SITE_OTHER): Payer: 59 | Admitting: Cardiovascular Disease

## 2017-03-18 VITALS — BP 134/78 | HR 69 | Ht 64.0 in | Wt 155.0 lb

## 2017-03-18 DIAGNOSIS — R072 Precordial pain: Secondary | ICD-10-CM

## 2017-03-18 DIAGNOSIS — R400 Somnolence: Secondary | ICD-10-CM | POA: Diagnosis not present

## 2017-03-18 NOTE — Patient Instructions (Signed)
Your physician has requested that you have an exercise tolerance test. For further information please visit [email or web address]. Please also follow instruction sheet, as given.  Dr Croitoru recommends that you follow-up with him as needed. 

## 2017-03-18 NOTE — Progress Notes (Signed)
Cardiology Consultation Note:    Date:  03/18/2017   ID:  ZAHARAH AMIR, DOB 1951-09-26, MRN [id number]  PCP:  Cheri Guppy, PA  Cardiologist:  Thurmon Fair, MD    Referring MD: Sandi Mealy, [facility]   Chief Complaint  Patient presents with  . Follow-up    New patient.  . Chest Pain   Pamela Ingram is a 66 y.o. female who is being seen today for the evaluation of Chest pain at the request of Sandi Mealy, [facility]   History of Present Illness:    Pamela Ingram is a 66 y.o. female with a hx of Hyperlipidemia, Remote smoking and postmenopausal state, with recent episodes of chest discomfort. Over. Of roughly 2 weeks she experienced off and on retrosternal chest pressure lasting for 30-60 minutes at a time. This did not occur with physical activity. Ibuprofen appeared to offer some improvement, but the pain was not clearly brought on by movement or local tenderness.  The patient specifically denies any chest pain with exertion, dyspnea at rest or with exertion, orthopnea, paroxysmal nocturnal dyspnea, syncope, palpitations, focal neurological deficits, intermittent claudication, lower extremity edema, unexplained weight gain, cough, hemoptysis or wheezing.  Interestingly, she scored 16 points on the Epworth Sleepiness Scale. It seems that she can take a nap at any time, however she wakes up in the morning feeling refreshed, does not have inappropriate sleeping, is not a particular loud snore, has not had witnessed apnea. Her body habitus is not suggestive of sleep apnea. She does not have symptoms of heart failure, edema or other sequelae of sleep apnea that I can identify.  After this appointment was made she noticed gradual improvement in her symptoms and she is currently asymptomatic. She does have a history of rheumatoid arthritis and is currently taking Actemra with good results. She has a lot of problems with stiffness in her joints in the morning, improving throughout the day.  She stopped smoking about 8 years ago after smoking half a pack a day for roughly 20 years. She does not have hypertension or diabetes mellitus but does have mild hyperlipidemia. She went through menopause roughly 8 years ago. There is no family history of premature cardiac illness or sudden death. Her mother had rheumatoid arthritis.  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  Past Medical History:  Diagnosis Date  . Anxiety   . Hyperlipidemia   . Pleurisy with effusion   . Rheumatoid arthritis(714.0)   . Thyroid disease    hypothyroidism    Past Surgical History:  Procedure Laterality Date  . COLONOSCOPY    . LAPAROSCOPY    . TUBAL LIGATION    . VAGINAL HYSTERECTOMY  1995    Current Medications: Current Meds  Medication Sig  . ALPRAZolam (XANAX) 0.5 MG tablet Take 0.5 mg by mouth as needed for sleep or anxiety.   . clobetasol cream (TEMOVATE) 0.05 % Apply topically 2 (two) times daily. Use 2x daily x 6 wks, then 1x daily x 6 wks, then qod x 6 wks, then 2x/wk  . DULoxetine (CYMBALTA) 60 MG capsule Take 60 mg by mouth every morning.   [name] ibuprofen (ADVIL,MOTRIN) 200 MG tablet Take 600-800 mg by mouth 2 (two) times daily as needed for moderate pain.  [name] levothyroxine (SYNTHROID, LEVOTHROID) 75 MCG tablet Take 75 mcg by mouth daily before breakfast.  . simvastatin (ZOCOR) 20 MG tablet Take 20 mg by mouth every evening.  . Tocilizumab (ACTEMRA)  162 MG/0.9ML SOSY Inject 162 mg/mL into the skin. Take every other week  . [DISCONTINUED] aspirin 81 MG tablet Take 81 mg by mouth daily.     Allergies:   Amoxicillin   Social History   Social History  . Marital status: Married    Spouse name: N/A  . Number of children: 2  . Years of education: N/A   Occupational History  . NURSE [place]   Social History Main Topics  . Smoking status: Former Smoker    Packs/day: 0.50    Years: 44.00    Quit date: 10/26/2009  . Smokeless tobacco: Never Used     Comment: off  and on x 44 years 03/14/13  . Alcohol use 0.0 oz/week     Comment: 1-2 drinks a week  . Drug use: No  . Sexual activity: Yes    Birth control/ protection: Surgical   Other Topics Concern  . None   Social History Narrative  . None     Family History: The patient's family history includes Diabetes in her paternal uncle; Emphysema in her mother; Hypertension in her father; Rheum arthritis in her mother; Stroke in her father. There is no history of Colon cancer, Rectal cancer, Stomach cancer, Esophageal cancer, or Cancer. ROS:   Please see the history of present illness.     All other systems reviewed and are negative.  EKGs/Labs/Other Studies Reviewed:    The following studies were reviewed today: Notes and labs from [facility]  EKG:  EKG is  ordered today.  The ekg ordered today demonstrates normal sinus rhythm  Recent Labs: 06/01/2016 Total cholesterol 218, tragus is 95, HDL 61, LDL 135 Creatinine 0.9, potassium 4.2, glucose 75, normal liver function tests, hemoglobin 14.6  Physical Exam:    VS:  BP 134/78   Pulse 69   Ht 5\' 4"  (1.626 m)   Wt 155 lb (70.3 kg)   BMI 26.61 kg/m     Wt Readings from Last 3 Encounters:  03/18/17 155 lb (70.3 kg)  07/24/15 158 lb (71.7 kg)  06/28/15 161 lb (73 kg)     GEN:  Well nourished, well developed in no acute distress HEENT: Normal NECK: No JVD; No carotid bruits LYMPHATICS: No lymphadenopathy CARDIAC: RRR, no murmurs, rubs, gallops RESPIRATORY:  Clear to auscultation without rales, wheezing or rhonchi  ABDOMEN: Soft, non-tender, non-distended MUSCULOSKELETAL:  No edema; No deformity  SKIN: Warm and dry NEUROLOGIC:  Alert and oriented x 3 PSYCHIATRIC:  Normal affect   ASSESSMENT:    1. Precordial chest pain    PLAN:    In order of problems listed above:  1. Symptoms are atypical, but also unexplained. She has a few coronary risk factors including hyperlipidemia, postmenopausal state and I think it's  reasonable to perform a plain treadmill stress test. If this is normal I think out pursue a cardiac diagnosis any further. If the stress test is abnormal will meet back in the office to discuss the next steps. 2. I don't think she has sleep apnea and would not recommend a sleep study at this point. I think she should monitor for episodes of inappropriate sleep or unexplained fatigue when she wakes in the morning. If these complaints become more prominent consider sleep study   Medication Adjustments/Labs and Tests Ordered: Current medicines are reviewed at length with the patient today.  Concerns regarding medicines are outlined above. Labs and tests ordered and medication changes are outlined in the patient instructions below:  Patient Instructions  Your physician has requested that you have an exercise tolerance test. For further information please visit [email or web address] Please also follow instruction sheet, as given.  Dr Royann Shivers recommends that you follow-up with him as needed.    Signed, Thurmon Fair, MD  03/18/2017 10:02 AM    [facility]

## 2017-03-29 DIAGNOSIS — L409 Psoriasis, unspecified: Secondary | ICD-10-CM | POA: Diagnosis not present

## 2017-04-08 ENCOUNTER — Inpatient Hospital Stay (HOSPITAL_COMMUNITY): Admission: RE | Admit: 2017-04-08 | Payer: 59 | Source: Ambulatory Visit

## 2017-04-08 MED FILL — ACTEMRA 162 MG/0.9 ML SYRN: 162 | 28 days supply | Qty: 2 | Fill #6

## 2017-04-15 MED FILL — LEVOTHYROXINE 75 MCG TABLET: 75 | 90 days supply | Qty: 90 | Fill #2

## 2017-04-22 ENCOUNTER — Encounter (HOSPITAL_COMMUNITY): Payer: 59

## 2017-05-03 DIAGNOSIS — Z79899 Other long term (current) drug therapy: Secondary | ICD-10-CM | POA: Diagnosis not present

## 2017-05-04 ENCOUNTER — Telehealth (HOSPITAL_COMMUNITY): Payer: Self-pay

## 2017-05-04 MED FILL — ACTEMRA 162 MG/0.9 ML SYRN: 162 | 28 days supply | Qty: 2 | Fill #7

## 2017-05-04 NOTE — Telephone Encounter (Signed)
Encounter complete. 

## 2017-05-06 ENCOUNTER — Ambulatory Visit (HOSPITAL_COMMUNITY)
Admission: RE | Admit: 2017-05-06 | Discharge: 2017-05-06 | Disposition: A | Discharge status: Home / Self Care | Payer: 59 | Source: Ambulatory Visit | Attending: Cardiovascular Disease | Admitting: Cardiovascular Disease

## 2017-05-06 DIAGNOSIS — R072 Precordial pain: Secondary | ICD-10-CM | POA: Diagnosis not present

## 2017-05-06 DIAGNOSIS — I259 Chronic ischemic heart disease, unspecified: Secondary | ICD-10-CM | POA: Insufficient documentation

## 2017-05-06 LAB — EXERCISE TOLERANCE TEST
Estimated workload: 10.1 METS
Exercise duration (min): 8 min
Exercise duration (sec): 23 s
MPHR: 156 {beats}/min
Peak HR: 146 {beats}/min
Percent HR: 93 %
RPE: 18
Rest HR: 64 {beats}/min

## 2017-05-13 ENCOUNTER — Telehealth: Payer: Self-pay | Admitting: *Deleted

## 2017-05-13 MED ORDER — ASPIRIN EC 81 MG PO TBEC: 81.0000 mg | DELAYED_RELEASE_TABLET | Freq: Every day | ORAL | 3 refills | Status: DC

## 2017-05-13 NOTE — Telephone Encounter (Signed)
Received call back from patient in regards to ETT results:  Notes recorded by Thurmon Fair, MD on 05/07/2017 at 12:19 PM EDT The stress test was abnormal. Please ask her to take ASA 81 mg daily and start metoprolol succinate 25 mg daily. Would like to talk to her about further evaluation. I do not see a follow up appointment. Please schedule one next 2 weeks.   Patient states she will start ASA 81mg  daily but does not want to start the metoprolol until speaking with Dr. further.   Also does not want to see APP, only wants to see Dr. Royann Shivers.    No available appts next week.  Will seek advise/approval to add on.

## 2017-05-13 NOTE — Telephone Encounter (Signed)
OK MCr 

## 2017-05-13 NOTE — Telephone Encounter (Signed)
appt scheduled for 7/24 at 10:20 AM at [facility] with Dr. Royann Shivers.  Left message to make aware.  Advised to call with questions or concerns.

## 2017-05-14 ENCOUNTER — Telehealth: Payer: Self-pay | Admitting: Cardiovascular Disease

## 2017-05-14 ENCOUNTER — Encounter: Payer: Self-pay | Admitting: Cardiovascular Disease

## 2017-05-14 ENCOUNTER — Ambulatory Visit: Payer: 59 | Admitting: Physician Assistant

## 2017-05-14 NOTE — Telephone Encounter (Signed)
Returned call to patient treadmill results given.Patient started taking aspirin 81 mg daily.Refuses to take metoprolol.Stated she will keep appointment with Dr.Croitoru 05/18/17 at 10:20 am she will discuss metoprolol then.

## 2017-05-14 NOTE — Telephone Encounter (Signed)
Pamela Ingram is calling to get her Stess test results .[name] Please call

## 2017-05-18 ENCOUNTER — Ambulatory Visit (INDEPENDENT_AMBULATORY_CARE_PROVIDER_SITE_OTHER): Payer: 59 | Admitting: Cardiovascular Disease

## 2017-05-18 ENCOUNTER — Encounter: Payer: Self-pay | Admitting: Cardiovascular Disease

## 2017-05-18 VITALS — BP 128/84 | HR 75 | Ht 64.0 in | Wt 155.0 lb

## 2017-05-18 DIAGNOSIS — Z01812 Encounter for preprocedural laboratory examination: Secondary | ICD-10-CM

## 2017-05-18 DIAGNOSIS — R9439 Abnormal result of other cardiovascular function study: Secondary | ICD-10-CM

## 2017-05-18 DIAGNOSIS — E7800 Pure hypercholesterolemia, unspecified: Secondary | ICD-10-CM | POA: Diagnosis not present

## 2017-05-18 DIAGNOSIS — R072 Precordial pain: Secondary | ICD-10-CM

## 2017-05-18 NOTE — Progress Notes (Signed)
Cardiology Office Note:    Date:  05/18/2017   ID:  Pamela Ingram, DOB 17-Oct-1951, MRN [id number]  PCP:  Cheri Guppy, PA  Cardiologist:  Thurmon Fair, MD    Referring MD: Sandi Mealy, [facility]   Chief Complaint  Patient presents with  . Follow-up    abnormal ett   Pamela Ingram is a 66 y.o. 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 Over. Of roughly 2 weeks she experienced off and on retrosternal chest pressure lasting for 30-60 minutes at a time. This did not occur with physical activity. Ibuprofen appeared to offer some improvement, but the pain was not clearly brought on by movement or local tenderness.  He underwent a treadmill stress test on July 12. She had good exercise tolerance but did develop chest tightness at peak exercise, as well as 1 mm ST segment depression in multiple leads.  She has not had chest discomfort since, but is worried about the results of the test.  The patient specifically denies dyspnea at rest or with exertion, orthopnea, paroxysmal nocturnal dyspnea, syncope, palpitations, focal neurological deficits, intermittent claudication, lower extremity edema, unexplained weight gain, cough, hemoptysis or wheezing.  Interestingly, she scored 16 points on the Epworth Sleepiness Scale. It seems that she can take a nap at any time, however she wakes up in the morning feeling refreshed, does not have inappropriate sleeping, is not a particular loud snore, has not had witnessed apnea. Her body habitus is not suggestive of sleep apnea. She does not have symptoms of heart failure, edema or other sequelae of sleep apnea that I can  identify.  She does have a history of rheumatoid arthritis and is currently taking Actemra with good results, but is worried by the fact that this medication can raise cholesterol levels. She has a lot of problems with stiffness in her joints in the morning, improving throughout the day.  She stopped smoking about 8 years ago after smoking half a pack a day for roughly 20 years. She does not have hypertension or diabetes mellitus but does have mild hyperlipidemia. She went through menopause roughly 8 years ago. There is no family history of premature cardiac illness or sudden death.    Past Medical History:  Diagnosis Date  . Anxiety   . Hyperlipidemia   . Pleurisy with effusion   . Rheumatoid arthritis(714.0)   . Thyroid disease    hypothyroidism    Past Surgical History:  Procedure Laterality Date  . COLONOSCOPY    . LAPAROSCOPY    . TUBAL LIGATION    . VAGINAL HYSTERECTOMY  1995    Current Medications: Current Meds  Medication Sig  . ALPRAZolam (XANAX) 0.5 MG tablet Take 0.5 mg by mouth as needed for sleep or anxiety.   [name] aspirin EC 81 MG tablet Take 1 tablet (81 mg total) by mouth daily.  . clobetasol cream (TEMOVATE) 0.05 % Apply topically 2 (two) times daily. Use 2x daily x 6 wks, then 1x daily x 6 wks, then qod x 6 wks, then 2x/wk  . DULoxetine (CYMBALTA) 60 MG capsule Take 60 mg by mouth every morning.   [name] ibuprofen (  ADVIL,MOTRIN) 200 MG tablet Take 600-800 mg by mouth 2 (two) times daily as needed for moderate pain.  [name] levothyroxine (SYNTHROID, LEVOTHROID) 75 MCG tablet Take 75 mcg by mouth daily before breakfast.  . simvastatin (ZOCOR) 20 MG tablet Take 20 mg by mouth every evening.  . Tocilizumab (ACTEMRA) 162 MG/0.9ML SOSY Inject 162 mg/mL into the skin. Take every other week     Allergies:   Amoxicillin   Social History   Social History  . Marital status: Married    Spouse name: N/A  . Number of children: 2  . 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 Smoking status: Former Smoker    Packs/day: 0.50    Years: 44.00    Quit date: 10/26/2009  . Smokeless tobacco: Never Used     Comment: off and on x 44 years 03/14/13  . Alcohol use 0.0 oz/week     Comment: 1-2 drinks a week  . Drug use: No  . Sexual activity: Yes    Birth control/ protection: Surgical   Other Topics Concern  . None   Social History Narrative  . None     Family History: The patient's family history includes Diabetes in her paternal uncle; Emphysema in her mother; Hypertension in her father; Rheum arthritis in her mother; Stroke in her father. There is no history of Colon cancer, Rectal cancer, Stomach cancer, Esophageal cancer, or Cancer. ROS:   Please see the history of present illness.    All other systems reviewed and are negative.  EKGs/Labs/Other Studies Reviewed:    The following studies were reviewed today: ECG treadmill stress test  EKG:  EKG is ordered today.  The ekg ordered today demonstrates Normal sinus rhythm and no repolarization abnormalities  Recent Labs: 06/01/2016 Total cholesterol 218, TG 95, HDL 61, LDL 135 Creatinine 0.9, potassium 4.2, glucose 75, normal liver function tests, hemoglobin 14.6  Physical Exam:    VS:  BP 128/84   Pulse 75   Ht 5\' 4"  (1.626 m)   Wt 155 lb (70.3 kg)   BMI 26.61 kg/m     Wt Readings from Last 3 Encounters:  05/18/17 155 lb (70.3 kg)  03/18/17 155 lb (70.3 kg)  07/24/15 158 lb (71.7 kg)     GEN:  Well nourished, well developed in no acute distress HEENT: Normal NECK: No JVD; No carotid bruits LYMPHATICS: No lymphadenopathy CARDIAC: RRR, no murmurs, rubs, gallops RESPIRATORY:  Clear to auscultation without rales, wheezing or rhonchi  ABDOMEN: Soft, non-tender, non-distended MUSCULOSKELETAL:  No edema; No deformity  SKIN: Warm and dry NEUROLOGIC:  Alert and oriented x 3 PSYCHIATRIC:  Normal affect   ASSESSMENT:    1. Abnormal  cardiovascular stress test   2. Pure hypercholesterolemia   3. Precordial chest pain   4. Pre-procedure lab exam    PLAN:    In order of problems listed above:  1. Symptoms are atypical. The ECG stress test shows unequivocal horizontal ST depression in all the inferior and lateral leads, albeit mild at only 1 mm and quickly resolved in recovery. Risk factor profile is intermediate. Discussed various options for repeat evaluation, acknowledging the fact that "false positive" studies may occur. We decided that definitive diagnosis with coronary angiography is the preferred next step. This procedure has been fully reviewed with the patient and written informed consent has been obtained. We also reviewed the option for angioplasty and stent placement at  the time of the diagnostic procedure, should this be necessary. She would like to miss as little time as possible from work, will arrange for the study to be performed on a Friday. 2. If angiography confirms the presence of significant CAD, then her target LDL cholesterol should be under 70. Would switch from simvastatin to more potent agent such as rosuvastatin.  Medication Adjustments/Labs and Tests Ordered: Current medicines are reviewed at length with the patient today.  Concerns regarding medicines are outlined above. Labs and tests ordered and medication changes are outlined in the patient instructions below:  Patient Instructions    [facility] CARDIOVASCULAR DIVISION [facility] [address] [place] [address] Dept: [phone] Loc: [phone]  Pamela Ingram  05/18/2017  You are scheduled for a Cardiac Catheterization on Friday, August 10 with Dr. Tonny Bollman.  1. Please arrive at the [facility] (Main Entrance A) at [facility]: [address] [place], [place] [address] at 5:30 AM (two hours before your procedure to ensure your preparation). Free valet parking service  is available.   Special note: Every effort is made to have your procedure done on time. Please understand that emergencies sometimes delay scheduled procedures.  2. Diet: Do not eat or drink anything after midnight prior to your procedure except sips of water to take medications.  3. Labs: You will need to have blood drawn on Monday, August 6 at [facility] [address], [place]  Open: 8am - 5pm (Lunch 12:30 - 1:30)   Phone: [phone]. You do not need to be fasting.  4. Medication instructions in preparation for your procedure: On the morning of your procedure, take your Aspirin.  You may use sips of water.  5. Plan for one night stay--bring personal belongings.  6. Bring a current list of your medications and current insurance cards.  7. You MUST have a responsible person to drive you home.  8. Someone MUST be with you the first 24 hours after you arrive home or your discharge will be delayed.  9. Please wear clothes that are easy to get on and off and wear slip-on shoes.  Thank you for allowing [place] to care for you!   -- [facility]    Signed, Thurmon Fair, MD  05/18/2017 11:22 AM    [facility]

## 2017-05-18 NOTE — Patient Instructions (Addendum)
   [facility] CARDIOVASCULAR DIVISION [facility] [address] [place] [address] Dept: [phone] Loc: [phone]  Pamela Ingram  05/18/2017  You are scheduled for a Cardiac Catheterization on Friday, August 10 with Dr. Tonny Bollman.  1. Please arrive at the [facility] (Main Entrance A) at [facility]: [address] [place], [place] [address] at 5:30 AM (two hours before your procedure to ensure your preparation). Free valet parking service is available.   Special note: Every effort is made to have your procedure done on time. Please understand that emergencies sometimes delay scheduled procedures.  2. Diet: Do not eat or drink anything after midnight prior to your procedure except sips of water to take medications.  3. Labs: You will need to have blood drawn on Monday, August 6 at [facility] [address], [place]  Open: 8am - 5pm (Lunch 12:30 - 1:30)   Phone: [phone]. You do not need to be fasting.  4. Medication instructions in preparation for your procedure: On the morning of your procedure, take your Aspirin.  You may use sips of water.  5. Plan for one night stay--bring personal belongings.  6. Bring a current list of your medications and current insurance cards.  7. You MUST have a responsible person to drive you home.  8. Someone MUST be with you the first 24 hours after you arrive home or your discharge will be delayed.  9. Please wear clothes that are easy to get on and off and wear slip-on shoes.  Thank you for allowing [place] to care for you!   -- [place] [facility]

## 2017-05-19 NOTE — Progress Notes (Signed)
[place]..., [place].Marland KitchenMarland Kitchen

## 2017-05-21 MED FILL — DULoxetine HCL 60 MG CPEP: 60 | 90 days supply | Qty: 90 | Fill #0

## 2017-05-31 DIAGNOSIS — Z01812 Encounter for preprocedural laboratory examination: Secondary | ICD-10-CM | POA: Diagnosis not present

## 2017-05-31 DIAGNOSIS — R072 Precordial pain: Secondary | ICD-10-CM | POA: Diagnosis not present

## 2017-05-31 DIAGNOSIS — R9439 Abnormal result of other cardiovascular function study: Secondary | ICD-10-CM | POA: Diagnosis not present

## 2017-06-01 LAB — CBC
Hematocrit: 42.7 % (ref 34.0–46.6)
Hemoglobin: 14 g/dL (ref 11.1–15.9)
MCH: 29.4 pg (ref 26.6–33.0)
MCHC: 32.8 g/dL (ref 31.5–35.7)
MCV: 90 fL (ref 79–97)
Platelets: 286 10*3/uL (ref 150–379)
RBC: 4.77 x10E6/uL (ref 3.77–5.28)
RDW: 12.8 % (ref 12.3–15.4)
WBC: 5.2 10*3/uL (ref 3.4–10.8)

## 2017-06-01 LAB — BASIC METABOLIC PANEL
BUN/Creatinine Ratio: 24 (ref 12–28)
BUN: 21 mg/dL (ref 8–27)
CO2: 24 mmol/L (ref 20–29)
Calcium: 9.7 mg/dL (ref 8.7–10.3)
Chloride: 102 mmol/L (ref 96–106)
Creatinine, Ser: 0.88 mg/dL (ref 0.57–1.00)
GFR calc Af Amer: 80 mL/min/{1.73_m2} (ref >59)
GFR calc non Af Amer: 70 mL/min/{1.73_m2} (ref >59)
Glucose: 87 mg/dL (ref 65–99)
Potassium: 4.5 mmol/L (ref 3.5–5.2)
Sodium: 142 mmol/L (ref 134–144)

## 2017-06-01 LAB — PROTIME-INR
INR: 0.9 (ref 0.8–1.2)
Prothrombin Time: 9.8 s (ref 9.1–12.0)

## 2017-06-02 MED FILL — ACTEMRA 162 MG/0.9 ML SYRN: 162 | 28 days supply | Qty: 2 | Fill #8

## 2017-06-03 ENCOUNTER — Telehealth: Payer: Self-pay | Admitting: Cardiovascular Disease

## 2017-06-03 ENCOUNTER — Telehealth: Payer: Self-pay

## 2017-06-03 MED FILL — predniSONE 5 MG TABS: 5 | 6 days supply | Qty: 21 | Fill #0

## 2017-06-03 NOTE — Telephone Encounter (Signed)
Spoke to pt. She was very anxious about her CATH scheduled for tomorrow. She stated she just was not sure what was going to happen or if she needed this procedure. Stated she did not want to go through with it if it is not something that she needs. She has not had anymore chest discomfort or DOE.   Explained to pt what procedure was and what would happen during the procedure. Also explained that Dr. Royann Shivers would not have mentioned it if he did not think it was needed.   Pt also stated she had an arthritis flare up and has been prescribed Prednisone for this, but is not taking it until after her procedure. Pt asked if it would still be ok to have procedure with the swelling.   Explained to pt that this should still be ok. Labs looked normal, but Dr. Royann Shivers still has not resulted them as of yet. Told her she will be called with those results when they come back.   Answered all questions to the satisfaction of the pt. Pt stated she feels better and was thankful for the call. She stated she was feeling fine about the procedure when Dr. Royann Shivers explained it to her at the last ov, but the closer it got, the more anxious she felt, but this call was very helpful.  Routing to Dr. Salena Saner as Lorain Childes.

## 2017-06-03 NOTE — Telephone Encounter (Signed)
Detailed message left per DPR.  Patient contacted pre-catheterization at [facility] scheduled for:  06/04/2017 @ 0730 Verified arrival time and place:  NT @ 0530 Confirmed AM meds to be taken pre-cath with sip of water: Take ASA prior to arrival Patient must have responsible person to drive home post procedure and observe patient for 24 hours Addl concerns:  Left this nurse name and # if any further questions/concerns

## 2017-06-03 NOTE — Telephone Encounter (Signed)
She had a frankly abnormal stress test and I would recommend going ahead with a cardiac catheterization. This is a relatively low risk procedure and I think she is going to do fine with it. The presence of joint swelling should have no impact on the procedure. It's fine if she would like to delay taking the prednisone until after the procedure is performed. Her labs indeed looked great, I don't usually report them back if they are preprocedure lab, unless I see a problem.

## 2017-06-03 NOTE — Telephone Encounter (Signed)
Call back received from Pt.  Discussed upcoming procedure.  All questions answered to Pt satisfaction.  Pt appreciative of call.

## 2017-06-03 NOTE — Telephone Encounter (Signed)
Recommendations given to pt. Pt verbalized appreciation for Dr. Erin Hearing response and is feeling much better. Someone from the hospital called to go over the instructions with her and went through the steps of the procedure with her. She stated she is so thankful and feels ready for procedure tomorrow.

## 2017-06-03 NOTE — Telephone Encounter (Signed)
Pamela Ingram is calling because she has some questions about the cath that she is having done on tomorrow . Please call

## 2017-06-04 ENCOUNTER — Encounter (HOSPITAL_COMMUNITY): Payer: Self-pay | Admitting: Cardiovascular Disease

## 2017-06-04 ENCOUNTER — Ambulatory Visit (HOSPITAL_COMMUNITY)
Admission: RE | Admit: 2017-06-04 | Discharge: 2017-06-04 | Disposition: A | Discharge status: Home / Self Care | Payer: 59 | Source: Ambulatory Visit | Attending: Cardiovascular Disease | Admitting: Cardiovascular Disease

## 2017-06-04 ENCOUNTER — Encounter (HOSPITAL_COMMUNITY)
Admission: RE | Disposition: A | Discharge status: Home / Self Care | Payer: Self-pay | Source: Ambulatory Visit | Attending: Cardiovascular Disease

## 2017-06-04 DIAGNOSIS — M069 Rheumatoid arthritis, unspecified: Secondary | ICD-10-CM | POA: Insufficient documentation

## 2017-06-04 DIAGNOSIS — Z880 Allergy status to penicillin: Secondary | ICD-10-CM | POA: Insufficient documentation

## 2017-06-04 DIAGNOSIS — Z780 Asymptomatic menopausal state: Secondary | ICD-10-CM | POA: Insufficient documentation

## 2017-06-04 DIAGNOSIS — F419 Anxiety disorder, unspecified: Secondary | ICD-10-CM | POA: Diagnosis not present

## 2017-06-04 DIAGNOSIS — E039 Hypothyroidism, unspecified: Secondary | ICD-10-CM | POA: Insufficient documentation

## 2017-06-04 DIAGNOSIS — R0789 Other chest pain: Secondary | ICD-10-CM | POA: Insufficient documentation

## 2017-06-04 DIAGNOSIS — Z87891 Personal history of nicotine dependence: Secondary | ICD-10-CM | POA: Insufficient documentation

## 2017-06-04 DIAGNOSIS — R9439 Abnormal result of other cardiovascular function study: Secondary | ICD-10-CM | POA: Diagnosis not present

## 2017-06-04 DIAGNOSIS — Z7982 Long term (current) use of aspirin: Secondary | ICD-10-CM | POA: Diagnosis not present

## 2017-06-04 DIAGNOSIS — E7800 Pure hypercholesterolemia, unspecified: Secondary | ICD-10-CM | POA: Diagnosis not present

## 2017-06-04 HISTORY — PX: LEFT HEART CATH AND CORONARY ANGIOGRAPHY: CATH118249

## 2017-06-04 SURGERY — LEFT HEART CATH AND CORONARY ANGIOGRAPHY
Anesthesia: LOCAL

## 2017-06-04 MED ORDER — ASPIRIN 81 MG PO CHEW: 81.0000 mg | CHEWABLE_TABLET | ORAL | Status: DC

## 2017-06-04 MED ORDER — MIDAZOLAM HCL 2 MG/2ML IJ SOLN
INTRAMUSCULAR | Status: DC | PRN
  Administered 2017-06-04: 2 mg via INTRAVENOUS

## 2017-06-04 MED ORDER — SODIUM CHLORIDE 0.9% FLUSH: 3.0000 mL | INTRAVENOUS | Status: DC | PRN

## 2017-06-04 MED ORDER — FENTANYL CITRATE (PF) 100 MCG/2ML IJ SOLN
INTRAMUSCULAR | Status: AC
  Filled 2017-06-04: qty 2

## 2017-06-04 MED ORDER — IOPAMIDOL (ISOVUE-370) INJECTION 76%
INTRAVENOUS | Status: DC | PRN
  Administered 2017-06-04: 50 mL via INTRAVENOUS

## 2017-06-04 MED ORDER — MIDAZOLAM HCL 2 MG/2ML IJ SOLN
INTRAMUSCULAR | Status: AC
  Filled 2017-06-04: qty 2

## 2017-06-04 MED ORDER — HEPARIN SODIUM (PORCINE) 1000 UNIT/ML IJ SOLN
INTRAMUSCULAR | Status: AC
  Filled 2017-06-04: qty 1

## 2017-06-04 MED ORDER — HEPARIN (PORCINE) IN NACL 2-0.9 UNIT/ML-% IJ SOLN
INTRAMUSCULAR | Status: AC
  Filled 2017-06-04: qty 1000

## 2017-06-04 MED ORDER — ACETAMINOPHEN 325 MG PO TABS: 650.0000 mg | ORAL_TABLET | ORAL | Status: DC | PRN

## 2017-06-04 MED ORDER — SODIUM CHLORIDE 0.9% FLUSH: 3.0000 mL | Freq: Two times a day (BID) | INTRAVENOUS | Status: DC

## 2017-06-04 MED ORDER — LIDOCAINE HCL (PF) 1 % IJ SOLN
INTRAMUSCULAR | Status: DC | PRN
  Administered 2017-06-04: 2 mL

## 2017-06-04 MED ORDER — HEPARIN SODIUM (PORCINE) 1000 UNIT/ML IJ SOLN
INTRAMUSCULAR | Status: DC | PRN
  Administered 2017-06-04: 4000 [IU] via INTRAVENOUS

## 2017-06-04 MED ORDER — LIDOCAINE HCL (PF) 1 % IJ SOLN
INTRAMUSCULAR | Status: AC
  Filled 2017-06-04: qty 30

## 2017-06-04 MED ORDER — IOPAMIDOL (ISOVUE-370) INJECTION 76%
INTRAVENOUS | Status: AC
  Filled 2017-06-04: qty 100

## 2017-06-04 MED ORDER — SODIUM CHLORIDE 0.9 % IV SOLN: 250.0000 mL | INTRAVENOUS | Status: DC | PRN

## 2017-06-04 MED ORDER — FENTANYL CITRATE (PF) 100 MCG/2ML IJ SOLN
INTRAMUSCULAR | Status: DC | PRN
  Administered 2017-06-04: 25 ug via INTRAVENOUS

## 2017-06-04 MED ORDER — SODIUM CHLORIDE 0.9 % WEIGHT BASED INFUSION: 1.0000 mL/kg/h | INTRAVENOUS | Status: DC

## 2017-06-04 MED ORDER — VERAPAMIL HCL 2.5 MG/ML IV SOLN
INTRAVENOUS | Status: DC | PRN
  Administered 2017-06-04: 3 mL via INTRA_ARTERIAL

## 2017-06-04 MED ORDER — HEPARIN (PORCINE) IN NACL 2-0.9 UNIT/ML-% IJ SOLN
INTRAMUSCULAR | Status: AC | PRN
  Administered 2017-06-04: 1000 mL

## 2017-06-04 MED ORDER — ONDANSETRON HCL 4 MG/2ML IJ SOLN: 4.0000 mg | Freq: Four times a day (QID) | INTRAMUSCULAR | Status: DC | PRN

## 2017-06-04 MED ORDER — SODIUM CHLORIDE 0.9 % WEIGHT BASED INFUSION
3.0000 mL/kg/h | INTRAVENOUS | Status: AC
  Administered 2017-06-04: 3 mL/kg/h via INTRAVENOUS

## 2017-06-04 MED ORDER — VERAPAMIL HCL 2.5 MG/ML IV SOLN
INTRAVENOUS | Status: AC
  Filled 2017-06-04: qty 2

## 2017-06-04 SURGICAL SUPPLY — 10 items

## 2017-06-04 NOTE — H&P (View-Only) (Signed)
Cardiology Office Note:    Date:  05/18/2017   ID:  Pamela Ingram, DOB 12/25/1950, MRN [id number]  PCP:  Pattison, Sarah, PA  Cardiologist:  Callyn Severtson, MD    Referring MD: Maida Helen Martin, PAC   Chief Complaint  Patient presents with  . Follow-up    abnormal ett   Pamela Ingram is a 65 y.o. 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 Over. Of roughly 2 weeks she experienced off and on retrosternal chest pressure lasting for 30-60 minutes at a time. This did not occur with physical activity. Ibuprofen appeared to offer some improvement, but the pain was not clearly brought on by movement or local tenderness.  He underwent a treadmill stress test on July 12. She had good exercise tolerance but did develop chest tightness at peak exercise, as well as 1 mm ST segment depression in multiple leads.  She has not had chest discomfort since, but is worried about the results of the test.  The patient specifically denies dyspnea at rest or with exertion, orthopnea, paroxysmal nocturnal dyspnea, syncope, palpitations, focal neurological deficits, intermittent claudication, lower extremity edema, unexplained weight gain, cough, hemoptysis or wheezing.  Interestingly, she scored 16 points on the Epworth Sleepiness Scale. It seems that she can take a nap at any time, however she wakes up in the morning feeling refreshed, does not have inappropriate sleeping, is not a particular loud snore, has not had witnessed apnea. Her body habitus is not suggestive of sleep apnea. She does not have symptoms of heart failure, edema or other sequelae of sleep apnea that I can  identify.  She does have a history of rheumatoid arthritis and is currently taking Actemra with good results, but is worried by the fact that this medication can raise cholesterol levels. She has a lot of problems with stiffness in her joints in the morning, improving throughout the day.  She stopped smoking about 8 years ago after smoking half a pack a day for roughly 20 years. She does not have hypertension or diabetes mellitus but does have mild hyperlipidemia. She went through menopause roughly 8 years ago. There is no family history of premature cardiac illness or sudden death.    Past Medical History:  Diagnosis Date  . Anxiety   . Hyperlipidemia   . Pleurisy with effusion   . Rheumatoid arthritis(714.0)   . Thyroid disease    hypothyroidism    Past Surgical History:  Procedure Laterality Date  . COLONOSCOPY    . LAPAROSCOPY    . TUBAL LIGATION    . VAGINAL HYSTERECTOMY  1995    Current Medications: Current Meds  Medication Sig  . ALPRAZolam (XANAX) 0.5 MG tablet Take 0.5 mg by mouth as needed for sleep or anxiety.   . aspirin EC 81 MG tablet Take 1 tablet (81 mg total) by mouth daily.  . clobetasol cream (TEMOVATE) 0.05 % Apply topically 2 (two) times daily. Use 2x daily x 6 wks, then 1x daily x 6 wks, then qod x 6 wks, then 2x/wk  . DULoxetine (CYMBALTA) 60 MG capsule Take 60 mg by mouth every morning.   . ibuprofen (  ADVIL,MOTRIN) 200 MG tablet Take 600-800 mg by mouth 2 (two) times daily as needed for moderate pain.  [name] levothyroxine (SYNTHROID, LEVOTHROID) 75 MCG tablet Take 75 mcg by mouth daily before breakfast.  . simvastatin (ZOCOR) 20 MG tablet Take 20 mg by mouth every evening.  . Tocilizumab (ACTEMRA) 162 MG/0.9ML SOSY Inject 162 mg/mL into the skin. Take every other week     Allergies:   Amoxicillin   Social History   Social History  . Marital status: Married    Spouse name: N/A  . Number of children: 2  . 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 Smoking status: Former Smoker    Packs/day: 0.50    Years: 44.00    Quit date: 10/26/2009  . Smokeless tobacco: Never Used     Comment: off and on x 44 years 03/14/13  . Alcohol use 0.0 oz/week     Comment: 1-2 drinks a week  . Drug use: No  . Sexual activity: Yes    Birth control/ protection: Surgical   Other Topics Concern  . None   Social History Narrative  . None     Family History: The patient's family history includes Diabetes in her paternal uncle; Emphysema in her mother; Hypertension in her father; Rheum arthritis in her mother; Stroke in her father. There is no history of Colon cancer, Rectal cancer, Stomach cancer, Esophageal cancer, or Cancer. ROS:   Please see the history of present illness.    All other systems reviewed and are negative.  EKGs/Labs/Other Studies Reviewed:    The following studies were reviewed today: ECG treadmill stress test  EKG:  EKG is ordered today.  The ekg ordered today demonstrates Normal sinus rhythm and no repolarization abnormalities  Recent Labs: 06/01/2016 Total cholesterol 218, TG 95, HDL 61, LDL 135 Creatinine 0.9, potassium 4.2, glucose 75, normal liver function tests, hemoglobin 14.6  Physical Exam:    VS:  BP 128/84   Pulse 75   Ht 5\' 4"  (1.626 m)   Wt 155 lb (70.3 kg)   BMI 26.61 kg/m     Wt Readings from Last 3 Encounters:  05/18/17 155 lb (70.3 kg)  03/18/17 155 lb (70.3 kg)  07/24/15 158 lb (71.7 kg)     GEN:  Well nourished, well developed in no acute distress HEENT: Normal NECK: No JVD; No carotid bruits LYMPHATICS: No lymphadenopathy CARDIAC: RRR, no murmurs, rubs, gallops RESPIRATORY:  Clear to auscultation without rales, wheezing or rhonchi  ABDOMEN: Soft, non-tender, non-distended MUSCULOSKELETAL:  No edema; No deformity  SKIN: Warm and dry NEUROLOGIC:  Alert and oriented x 3 PSYCHIATRIC:  Normal affect   ASSESSMENT:    1. Abnormal  cardiovascular stress test   2. Pure hypercholesterolemia   3. Precordial chest pain   4. Pre-procedure lab exam    PLAN:    In order of problems listed above:  1. Symptoms are atypical. The ECG stress test shows unequivocal horizontal ST depression in all the inferior and lateral leads, albeit mild at only 1 mm and quickly resolved in recovery. Risk factor profile is intermediate. Discussed various options for repeat evaluation, acknowledging the fact that "false positive" studies may occur. We decided that definitive diagnosis with coronary angiography is the preferred next step. This procedure has been fully reviewed with the patient and written informed consent has been obtained. We also reviewed the option for angioplasty and stent placement at  the time of the diagnostic procedure, should this be necessary. She would like to miss as little time as possible from work, will arrange for the study to be performed on a Friday. 2. If angiography confirms the presence of significant CAD, then her target LDL cholesterol should be under 70. Would switch from simvastatin to more potent agent such as rosuvastatin.  Medication Adjustments/Labs and Tests Ordered: Current medicines are reviewed at length with the patient today.  Concerns regarding medicines are outlined above. Labs and tests ordered and medication changes are outlined in the patient instructions below:  Patient Instructions    [facility] CARDIOVASCULAR DIVISION [facility] [address] [place] [address] Dept: [phone] Loc: [phone]  Pamela Ingram  05/18/2017  You are scheduled for a Cardiac Catheterization on Friday, August 10 with Dr. Tonny Bollman.  1. Please arrive at the [facility] (Main Entrance A) at [facility]: [address] [place], [place] [address] at 5:30 AM (two hours before your procedure to ensure your preparation). Free valet parking service  is available.   Special note: Every effort is made to have your procedure done on time. Please understand that emergencies sometimes delay scheduled procedures.  2. Diet: Do not eat or drink anything after midnight prior to your procedure except sips of water to take medications.  3. Labs: You will need to have blood drawn on Monday, August 6 at [facility] [address], [place]  Open: 8am - 5pm (Lunch 12:30 - 1:30)   Phone: [phone]. You do not need to be fasting.  4. Medication instructions in preparation for your procedure: On the morning of your procedure, take your Aspirin.  You may use sips of water.  5. Plan for one night stay--bring personal belongings.  6. Bring a current list of your medications and current insurance cards.  7. You MUST have a responsible person to drive you home.  8. Someone MUST be with you the first 24 hours after you arrive home or your discharge will be delayed.  9. Please wear clothes that are easy to get on and off and wear slip-on shoes.  Thank you for allowing [place] to care for you!   -- [facility]    Signed, Thurmon Fair, MD  05/18/2017 11:22 AM    [facility]

## 2017-06-04 NOTE — Discharge Instructions (Signed)

## 2017-06-04 NOTE — Interval H&P Note (Signed)
Cath Lab Visit (complete for each Cath Lab visit)  Clinical Evaluation Leading to the Procedure:   ACS: No.  Non-ACS:    Anginal Classification: CCS II  Anti-ischemic medical therapy: No Therapy  Non-Invasive Test Results: Intermediate-risk stress test findings: cardiac mortality 1-3%/year  Prior CABG: No previous CABG      History and Physical Interval Note:  06/04/2017 7:24 AM  Pamela Ingram  has presented today for surgery, with the diagnosis of abnormal stress test  The various methods of treatment have been discussed with the patient and family. After consideration of risks, benefits and other options for treatment, the patient has consented to  Procedure(s): Left Heart Cath and Coronary Angiography (N/A) as a surgical intervention .  The patient's history has been reviewed, patient examined, no change in status, stable for surgery.  I have reviewed the patient's chart and labs.  Questions were answered to the patient's satisfaction.     Tonny Bollman

## 2017-06-10 ENCOUNTER — Telehealth: Payer: Self-pay | Admitting: Cardiovascular Disease

## 2017-06-10 NOTE — Telephone Encounter (Signed)
OK to stop aspirin as well. MCr

## 2017-06-10 NOTE — Telephone Encounter (Signed)
Mrs.Demir is stating that to her understanding that she does not  have to take any medication because her cath was clean and clear . She is just wanting to understand everything . Please call

## 2017-06-10 NOTE — Telephone Encounter (Signed)
Patient aware and verbalized understanding.  Med list updated.  

## 2017-06-10 NOTE — Telephone Encounter (Signed)
Returned call to patient-patient wondering if she needed to continue taking her ASA and if Dr. Royann Shivers wanted to her to start on the metoprolol as previously mentioned.  Patient had cath 8/10-normal.  Advised that Dr. Royann Shivers did not order the metoprolol (as mentioned in result note) at her OV, therefore do not think it is recommended at this time..  Advised to continue current regimen.  Advised I would route to Dr. Royann Shivers to review and advise on ASA.   Patient aware and verbalized understanding.

## 2017-06-16 MED FILL — SIMVASTATIN 20 MG TABLET: 20 | 90 days supply | Qty: 90 | Fill #2

## 2017-07-01 MED FILL — ACTEMRA 162 MG/0.9 ML SYRN: 162 | 28 days supply | Qty: 2 | Fill #9

## 2017-07-19 DIAGNOSIS — Z79899 Other long term (current) drug therapy: Secondary | ICD-10-CM | POA: Diagnosis not present

## 2017-07-19 DIAGNOSIS — M0579 Rheumatoid arthritis with rheumatoid factor of multiple sites without organ or systems involvement: Secondary | ICD-10-CM | POA: Diagnosis not present

## 2017-07-20 DIAGNOSIS — Z0000 Encounter for general adult medical examination without abnormal findings: Secondary | ICD-10-CM | POA: Diagnosis not present

## 2017-07-21 MED FILL — LEVOTHYROXINE 75 MCG TABLET: 75 | 90 days supply | Qty: 90 | Fill #3

## 2017-07-28 MED FILL — ACTEMRA 162 MG/0.9 ML SYRN: 162 | 28 days supply | Qty: 2 | Fill #10

## 2017-07-29 DIAGNOSIS — Z0000 Encounter for general adult medical examination without abnormal findings: Secondary | ICD-10-CM | POA: Diagnosis not present

## 2017-07-29 DIAGNOSIS — E785 Hyperlipidemia, unspecified: Secondary | ICD-10-CM | POA: Diagnosis not present

## 2017-07-29 DIAGNOSIS — E039 Hypothyroidism, unspecified: Secondary | ICD-10-CM | POA: Diagnosis not present

## 2017-07-29 DIAGNOSIS — F419 Anxiety disorder, unspecified: Secondary | ICD-10-CM | POA: Diagnosis not present

## 2017-07-29 MED FILL — ALPRAZolam 0.5 MG TABS: 0.5 | 30 days supply | Qty: 30 | Fill #0

## 2017-08-03 ENCOUNTER — Other Ambulatory Visit (HOSPITAL_BASED_OUTPATIENT_CLINIC_OR_DEPARTMENT_OTHER): Payer: Self-pay | Admitting: *Deleted

## 2017-08-03 DIAGNOSIS — Z1231 Encounter for screening mammogram for malignant neoplasm of breast: Secondary | ICD-10-CM

## 2017-08-09 ENCOUNTER — Ambulatory Visit (HOSPITAL_BASED_OUTPATIENT_CLINIC_OR_DEPARTMENT_OTHER)
Admission: RE | Admit: 2017-08-09 | Discharge: 2017-08-09 | Disposition: A | Discharge status: Home / Self Care | Payer: 59 | Source: Ambulatory Visit | Attending: *Deleted | Admitting: *Deleted

## 2017-08-09 DIAGNOSIS — Z1231 Encounter for screening mammogram for malignant neoplasm of breast: Secondary | ICD-10-CM | POA: Insufficient documentation

## 2017-09-06 MED FILL — ACTEMRA 162 MG/0.9 ML SYRN: 162 | 28 days supply | Qty: 2 | Fill #0

## 2017-09-06 MED FILL — DULoxetine HCL 60 MG CPEP: 60 | 90 days supply | Qty: 90 | Fill #0

## 2017-09-09 ENCOUNTER — Ambulatory Visit: Payer: 59 | Admitting: Cardiovascular Disease

## 2017-09-23 MED FILL — AMOXICILLIN 500 MG CAPSULE: 500 | 7 days supply | Qty: 21 | Fill #0

## 2017-09-23 MED FILL — FLUCONAZOLE 150 MG TABLET: 150 | 2 days supply | Qty: 2 | Fill #0

## 2017-09-27 DIAGNOSIS — Z79899 Other long term (current) drug therapy: Secondary | ICD-10-CM | POA: Diagnosis not present

## 2017-10-01 MED FILL — ACTEMRA 162 MG/0.9 ML SYRN: 162 | 28 days supply | Qty: 2 | Fill #1

## 2017-10-01 MED FILL — AMOXICILLIN 500 MG CAPSULE: 500 | 5 days supply | Qty: 25 | Fill #0

## 2017-10-05 MED FILL — HYDROCODON-APAP 7.5-325: 7.5-325 | 1 days supply | Qty: 5 | Fill #0

## 2017-10-05 MED FILL — CEPHALEXIN 500 MG CAPSULE: 500 | 5 days supply | Qty: 19 | Fill #0

## 2017-10-06 ENCOUNTER — Telehealth: Payer: Self-pay | Admitting: Pharmacist

## 2017-10-06 NOTE — Telephone Encounter (Signed)
Called patient to schedule an appointment for the [facility]. I was unable to reach the patient so I left a HIPAA-compliant message requesting that the patient return my call.   

## 2017-10-07 ENCOUNTER — Other Ambulatory Visit: Payer: Self-pay | Admitting: Pharmacist

## 2017-10-07 ENCOUNTER — Encounter: Payer: Self-pay | Admitting: Pharmacist

## 2017-10-07 ENCOUNTER — Ambulatory Visit (HOSPITAL_BASED_OUTPATIENT_CLINIC_OR_DEPARTMENT_OTHER): Payer: 59 | Admitting: Pharmacist

## 2017-10-07 DIAGNOSIS — Z79899 Other long term (current) drug therapy: Secondary | ICD-10-CM

## 2017-10-07 MED ORDER — TOCILIZUMAB 162 MG/0.9ML ~~LOC~~ SOSY: PREFILLED_SYRINGE | SUBCUTANEOUS | 11 refills | Status: DC

## 2017-10-07 NOTE — Progress Notes (Signed)
   S: Patient presents to [facility] for review of their specialty medication therapy.  Patient is currently taking Actemra for rheumatoid arthritis. Patient is managed by Dr. Sigurd Sos in [place] for this. She has been on Actemra for about 3 years.  She reports her mother had RA and her son has gout.  Adherence: denies any missed doses unless instructed to hold.  Efficacy: feels like it is working well. She will still get some pain that she treats with ibuprofen but feels much better than on previous medications. She has failed Humira, Enbrel, and Xeljanz. She had a reaction to methotrexate (pleurisy)  Dosing: 0.9 ml every 2 weeks  Dose adjustments: Renal: no dose adjustments (has not been studied) Hepatic: monitor for hepatotoxicity  Drug-drug interactions: none  Screening: TB test: completed per patient Hepatitis: completed per patient  Monitoring: S/sx of infection: denies CBC: WNL S/sx of hypersensitivity: denies S/sx of malignancy: denies   O:     Lab Results  Component Value Date   WBC 5.2 05/31/2017   HGB 14.0 05/31/2017   HCT 42.7 05/31/2017   MCV 90 05/31/2017   PLT 286 05/31/2017      Chemistry      Component Value Date/Time   NA 142 05/31/2017 0907   K 4.5 05/31/2017 0907   CL 102 05/31/2017 0907   CO2 24 05/31/2017 0907   BUN 21 05/31/2017 0907   CREATININE 0.88 05/31/2017 0907      Component Value Date/Time   CALCIUM 9.7 05/31/2017 0907   ALKPHOS 58 06/28/2015 1239   AST 26 06/28/2015 1239   ALT 14 06/28/2015 1239   BILITOT 0.7 06/28/2015 1239       A/P: 1. Medication review: Patient currently on Actemra for rheumatoid arthritis and is tolerating it well with improved control of her RA and no adverse effects. Reviewed the medication with her, including the following: Actemra is a interleukin-6 receptor antagonist which reduces inflammation. Possible adverse effects include increased risk of infection,  malignancy (though it is unknown if this is due to the drug or disease state, and increase lipids or liver enzymes or changes in CBC. Patient should follow up regularly with rheumatologist for lab work.   Alvino Blood, PharmD, BCPS, BCACP, CPP Clinical Pharmacist Practitioner  [facility] and [facility]ness [phone]

## 2017-10-14 MED FILL — SIMVASTATIN 20 MG TABLET: 20 | 90 days supply | Qty: 90 | Fill #0

## 2017-10-20 ENCOUNTER — Ambulatory Visit (HOSPITAL_BASED_OUTPATIENT_CLINIC_OR_DEPARTMENT_OTHER)
Admission: RE | Admit: 2017-10-20 | Discharge: 2017-10-20 | Disposition: A | Discharge status: Home / Self Care | Payer: 59 | Source: Ambulatory Visit | Attending: Internal Medicine | Admitting: Internal Medicine

## 2017-10-20 ENCOUNTER — Encounter: Payer: Self-pay | Admitting: Internal Medicine

## 2017-10-20 ENCOUNTER — Ambulatory Visit (INDEPENDENT_AMBULATORY_CARE_PROVIDER_SITE_OTHER): Payer: 59 | Admitting: Internal Medicine

## 2017-10-20 VITALS — BP 140/78 | HR 78 | Temp 98.0°F | Ht 64.0 in | Wt 164.0 lb

## 2017-10-20 DIAGNOSIS — R079 Chest pain, unspecified: Secondary | ICD-10-CM | POA: Insufficient documentation

## 2017-10-20 DIAGNOSIS — R0789 Other chest pain: Secondary | ICD-10-CM | POA: Diagnosis not present

## 2017-10-20 DIAGNOSIS — R05 Cough: Secondary | ICD-10-CM | POA: Insufficient documentation

## 2017-10-20 DIAGNOSIS — J22 Unspecified acute lower respiratory infection: Secondary | ICD-10-CM

## 2017-10-20 DIAGNOSIS — R059 Cough, unspecified: Secondary | ICD-10-CM

## 2017-10-20 DIAGNOSIS — R0602 Shortness of breath: Secondary | ICD-10-CM | POA: Diagnosis not present

## 2017-10-20 NOTE — Progress Notes (Addendum)
   Subjective:    Patient ID: Pamela Ingram, female    DOB: 1951/01/11, 65 y.o.   MRN: [id number]  HPI First visit for this 66 year old White Female with rheumatoid arthritis treated with Actemra, works as Glass blower/designer at [employer].  She has had head cold symptoms for about 2 weeks or so.  She has developed some left chest pain.  Some 2-3 weeks ago she had dental work.  Was taken off of Actemra about a month ago in anticipation of dental work.  During the dental work was treated with amoxicillin and Keflex.  Has developed some occasional cough and congestion.  No chills and no significant sputum production.  Is concerned about possible pleurisy.  In 2014 she developed pleurisy with a pleural effusion seen by Pulmonology at [facility].  Was treated with prednisone at that time.  Complains of some left anterior chest wall pain and pain in upper shoulders bilaterally.  She contacted Rheumatology Department at [facility] today where she is treated.  Her usual physician was away but they advised a chest x-ray.  There is no sputum production.  Previously seen by family practitioner in [facility].  History of tubal ligation 1986, hysterectomy 1994, browlift 2016.  She has no known drug allergies.  She is to infections with amoxicillin.  3 pregnancies and 1 miscarriage.  History of hypothyroidism, anxiety, hyperlipidemia.  Is on thyroid replacement medication, simvastatin, duloxetine and alprazolam as well as Actemra.  Says she vaccine in the near future.  Does not smoke.  Social alcohol consumption.  She is married.  2 adult sons ages 13 and 36.  Family history: His sister died of breast cancer about a year ago.  Father died of complications of sepsis.  He had a history of stroke.  Mother died of respiratory complications.  She had a history of rheumatoid arthritis.    She has 1 sister living.  No brothers.  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  Has been seen by Dr. Royann Shivers for evaluation of chest pain in May 2018.  Treadmill test was recommended.    Review of Systems see above     Objective:   Physical Exam Pleasant female in no acute distress.  Skin is warm and dry.  No cervical adenopathy.  TMs and pharynx are clear.  Neck is supple.  Chest some coarse bronchial breath sounds bilaterally more prominent on the right than the left.  Somewhat clears with coughing.  She has palpable left anterior chest wall tenderness left mid rib cage area.  Some tenderness to palpation bilateral trapezius muscles.  No frank rales.       Assessment & Plan:  Acute Bronchitis  Left chest wall pain  Chest x-ray shows no pleural effusion or pneumonia  Plan: She has Zithromax Z-PAK at home and will take 2 tablets day 1 followed by 1 tablet days 2 through 5.  She will take tapering course of prednisone going from 60 mg to 0 mg over 7 days.  Rest and drink plenty of fluids.

## 2017-10-20 NOTE — Patient Instructions (Signed)
Chest x-ray is negative for pneumonia or pleural effusion.  Take Zithromax Z-Pak and tapering course of prednisone as directed.  Rest and drink plenty of fluids.

## 2017-10-28 MED FILL — ACTEMRA 162 MG/0.9 ML SYRN: 162 | 28 days supply | Qty: 2 | Fill #0

## 2017-11-01 ENCOUNTER — Telehealth: Payer: Self-pay | Admitting: Internal Medicine

## 2017-11-01 MED ORDER — LEVOTHYROXINE SODIUM 75 MCG PO TABS: 75.0000 ug | ORAL_TABLET | Freq: Every day | ORAL | 0 refills | Status: DC

## 2017-11-01 MED FILL — LEVOTHYROXINE 75 MCG TABLET: 75 | 90 days supply | Qty: 90 | Fill #0

## 2017-11-01 NOTE — Telephone Encounter (Signed)
Refill x 90 days 

## 2017-11-01 NOTE — Telephone Encounter (Signed)
She sees you for CPE (to est care) on 1/31.  You have seen her for a sick visit.  She has run out of her Synthroid .  She is wanting to know if you will refill her Synthroid because she hates to be without it until 1/31.  She is completely out.  Advised that normally we rely on the previous PCP to maintain medications until patient establishes care with our practice.  She reminded me that she has been seen here for a sick visit.  Advised I would ask you since she has been seen here once for a sick visit.    Pharmacy:  [facility]  Best # for Contact:  [phone]  Thank you.

## 2017-11-01 NOTE — Telephone Encounter (Signed)
Escribed

## 2017-11-03 ENCOUNTER — Other Ambulatory Visit: Payer: Self-pay | Admitting: Internal Medicine

## 2017-11-04 ENCOUNTER — Other Ambulatory Visit: Payer: Self-pay | Admitting: Internal Medicine

## 2017-11-04 DIAGNOSIS — Z0000 Encounter for general adult medical examination without abnormal findings: Secondary | ICD-10-CM

## 2017-11-04 DIAGNOSIS — Z1321 Encounter for screening for nutritional disorder: Secondary | ICD-10-CM

## 2017-11-04 DIAGNOSIS — Z1329 Encounter for screening for other suspected endocrine disorder: Secondary | ICD-10-CM

## 2017-11-04 DIAGNOSIS — Z13220 Encounter for screening for lipoid disorders: Secondary | ICD-10-CM

## 2017-11-20 ENCOUNTER — Emergency Department (INDEPENDENT_AMBULATORY_CARE_PROVIDER_SITE_OTHER)
Admission: EM | Admit: 2017-11-20 | Discharge: 2017-11-20 | Disposition: A | Discharge status: Home / Self Care | Payer: 59 | Source: Home / Self Care | Attending: Family Medicine | Admitting: Family Medicine

## 2017-11-20 ENCOUNTER — Encounter: Payer: Self-pay | Admitting: Emergency Medicine

## 2017-11-20 DIAGNOSIS — W273XXA Contact with needle (sewing), initial encounter: Secondary | ICD-10-CM

## 2017-11-20 DIAGNOSIS — IMO0001 Reserved for inherently not codable concepts without codable children: Secondary | ICD-10-CM

## 2017-11-20 DIAGNOSIS — S61239A Puncture wound without foreign body of unspecified finger without damage to nail, initial encounter: Secondary | ICD-10-CM

## 2017-11-20 MED ORDER — TETANUS-DIPHTH-ACELL PERTUSSIS 5-2.5-18.5 LF-MCG/0.5 IM SUSP
0.5000 mL | Freq: Once | INTRAMUSCULAR | Status: AC
  Administered 2017-11-20: 0.5 mL via INTRAMUSCULAR

## 2017-11-20 NOTE — ED Provider Notes (Signed)
[facility]    CSN: [id number] Arrival date & time: 11/20/17  1051     History   Chief Complaint Chief Complaint  Patient presents with  . Needlestick    HPI SHALLON YAKLIN is a 67 y.o. female.   HPI  MAHLET JERGENS is a 67 y.o. female presenting to UC with c/o needle stick to her Left index finger that occurred about 2 hours PTA while giving her cat an insulin injection.  Pt had given most of the medication before the cat moved, causing the needle to "knick" her Left index finger.  Pt reports washing her finger in warm running water and soap immediately.  She called her vet who reassured pt's cat is UTD on all vaccines including rabbies and that pt should be okay.  Pt called her PCP on-call nurse who recommended she be evaluated and get her Tetanus UTD.  Pt states she feels fine overall. She does not believe any insulin went into her system and does not believe the needle went very deep as it is not painful and did not bleed very much.     Past Medical History:  Diagnosis Date  . Anxiety   . Hyperlipidemia   . Pleurisy with effusion   . Rheumatoid arthritis(714.0)   . Thyroid disease    hypothyroidism    Patient Active Problem List   Diagnosis Date Noted  . Abnormal stress test 06/04/2017  . 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 Pleuritic chest pain 05/08/2013  . Overweight (BMI 25.0-29.9) 03/13/2013  . Rheumatoid arthritis(714.0) 10/26/2008  . Hyperlipemia 10/27/2007  . Hypothyroid 10/27/2003  . Depression with anxiety 10/26/1993    Past Surgical History:  Procedure Laterality Date  . COLONOSCOPY    . LAPAROSCOPY    . LEFT HEART CATH AND CORONARY ANGIOGRAPHY N/A 06/04/2017   Procedure: Left Heart Cath and Coronary Angiography;  Surgeon: Tonny Bollman, MD;  Location: [facility] INVASIVE CV LAB;  Service: Cardiovascular;  Laterality: N/A;  . TUBAL LIGATION    . VAGINAL HYSTERECTOMY  1995    OB History    No data available       Home Medications     Prior to Admission medications   Medication Sig Start Date End Date Taking? Authorizing Provider  acetaminophen (TYLENOL) 650 MG CR tablet Take 650 mg by mouth every 8 (eight) hours as needed for pain.    [provider]  ALPRAZolam Prudy Feeler) 0.5 MG tablet Take 0.5 mg by mouth daily as needed for sleep or anxiety.  04/05/12   Moses Manners, MD  DULoxetine (CYMBALTA) 60 MG capsule Take 60 mg by mouth daily.  04/05/12   Moses Manners, MD  ibuprofen (ADVIL,MOTRIN) 200 MG tablet Take 800 mg by mouth every 8 (eight) hours as needed for moderate pain.     [provider]  levothyroxine (SYNTHROID, LEVOTHROID) 75 MCG tablet Take 1 tablet (75 mcg total) by mouth daily before breakfast. 11/01/17   Baxley, [name], MD  simvastatin (ZOCOR) 20 MG tablet Take 20 mg by mouth every evening.    [provider]  Tocilizumab (ACTEMRA) 162 MG/0.9ML SOSY Inject 0.9 ml (1 syringe) under the skin every 2 weeks 10/07/17   Quentin Angst, MD    Family History Family History  Problem Relation Age of Onset  . Emphysema Mother   . Rheum arthritis Mother   . Hypertension Father   . Stroke Father   . Diabetes Paternal Uncle   .  Colon cancer Neg Hx   . Rectal cancer Neg Hx   . Stomach cancer Neg Hx   . Esophageal cancer Neg Hx   . Cancer Neg Hx     Social History Social History   Tobacco Use  . Smoking status: Former Smoker    Packs/day: 0.50    Years: 44.00    Pack years: 22.00    Last attempt to quit: 10/26/2009    Years since quitting: 8.0  . Smokeless tobacco: Never Used  . Tobacco comment: off and on x 44 years 03/14/13  Substance Use Topics  . Alcohol use: Yes    Alcohol/week: 0.0 oz    Comment: 1-2 drinks a week  . Drug use: No     Allergies   Amoxicillin   Review of Systems Review of Systems  Gastrointestinal: Negative for nausea.  Musculoskeletal: Negative for arthralgias and myalgias.  Skin: Positive for wound. Negative for color change.   Neurological: Negative for dizziness and light-headedness.     Physical Exam Triage Vital Signs ED Triage Vitals [11/20/17 1115]  Enc Vitals Group     BP 102/64     Pulse Rate 71     Resp      Temp 98 F (36.7 C)     Temp Source Oral     SpO2 96 %     Weight 160 lb 12 oz (72.9 kg)     Height 5\' 4"  (1.626 m)     Head Circumference      Peak Flow      Pain Score 0     Pain Loc      Pain Edu?      Excl. in GC?    No data found.  Updated Vital Signs BP 102/64 (BP Location: Right Arm)   Pulse 71   Temp 98 F (36.7 C) (Oral)   Ht 5\' 4"  (1.626 m)   Wt 160 lb 12 oz (72.9 kg)   SpO2 96%   BMI 27.59 kg/m   Visual Acuity Right Eye Distance:   Left Eye Distance:   Bilateral Distance:    Right Eye Near:   Left Eye Near:    Bilateral Near:     Physical Exam  Constitutional: She is oriented to person, place, and time. She appears well-developed and well-nourished. No distress.  HENT:  Mouth/Throat: Oropharynx is clear and moist.  Cardiovascular: Normal rate.  Pulmonary/Chest: Effort normal. No respiratory distress.  Musculoskeletal: Normal range of motion. She exhibits no edema or tenderness.  Neurological: She is alert and oriented to person, place, and time.  Skin: Skin is warm and dry. Capillary refill takes less than 2 seconds. She is not diaphoretic. No erythema.  Left index finger, volar distal aspect: superficial appearing puncture wound. Scant dried blood. Non-tender.   Nursing note and vitals reviewed.    UC Treatments / Results  Labs (all labs ordered are listed, but only abnormal results are displayed) Labs Reviewed - No data to display  EKG  EKG Interpretation None       Radiology No results found.  Procedures Procedures (including critical care time)  Medications Ordered in UC Medications  Tdap (BOOSTRIX) injection 0.5 mL (0.5 mLs Intramuscular Given 11/20/17 1131)     Initial Impression / Assessment and Plan / UC Course  I have  reviewed the triage vital signs and the nursing notes.  Pertinent labs & imaging results that were available during my care of the patient were reviewed by me and  considered in my medical decision making (see chart for details).     Wound appears superficial. Clean and dry Tdap updated in UC Consulted with Dr. Cathren Harsh, no testing or prophylactic antibiotics indicated at this time.   Pt has routine f/u with PCP later this week Encouraged to keep appointment and to have finger re-evaluated and to discuss any symptoms that may develop over the next few days. Pt may return to UC if needed before her PCP f/u.  Final Clinical Impressions(s) / UC Diagnoses   Final diagnoses:  Needle stick injury of finger of left hand, initial encounter    ED Discharge Orders    None       Controlled Substance Prescriptions [place] Controlled Substance Registry consulted? Not Applicable   Rolla Plate 11/20/17 1136

## 2017-11-20 NOTE — ED Triage Notes (Signed)
Patient was given her cat an injection this morning, the cat moved and patient stuck herself.  Patient immediately washed her hands with soapy water, no pain, no swelling.  Patient unsure of TD status.

## 2017-11-22 ENCOUNTER — Other Ambulatory Visit: Payer: 59 | Admitting: Internal Medicine

## 2017-11-22 DIAGNOSIS — Z13220 Encounter for screening for lipoid disorders: Secondary | ICD-10-CM

## 2017-11-22 DIAGNOSIS — Z0000 Encounter for general adult medical examination without abnormal findings: Secondary | ICD-10-CM | POA: Diagnosis not present

## 2017-11-22 DIAGNOSIS — Z1321 Encounter for screening for nutritional disorder: Secondary | ICD-10-CM

## 2017-11-22 DIAGNOSIS — Z1329 Encounter for screening for other suspected endocrine disorder: Secondary | ICD-10-CM

## 2017-11-23 LAB — LIPID PANEL
Cholesterol: 184 mg/dL (ref <200)
HDL: 66 mg/dL (ref >50)
LDL Cholesterol (Calc): 103 mg/dL (calc) — ABNORMAL HIGH
Non-HDL Cholesterol (Calc): 118 mg/dL (calc) (ref <130)
Total CHOL/HDL Ratio: 2.8 (calc) (ref <5.0)
Triglycerides: 64 mg/dL (ref <150)

## 2017-11-23 LAB — CBC WITH DIFFERENTIAL/PLATELET
Basophils Absolute: 32 cells/uL (ref 0–200)
Basophils Relative: 0.6 %
Eosinophils Absolute: 191 cells/uL (ref 15–500)
Eosinophils Relative: 3.6 %
HCT: 39.3 % (ref 35.0–45.0)
Hemoglobin: 13.3 g/dL (ref 11.7–15.5)
Lymphs Abs: 1468 cells/uL (ref 850–3900)
MCH: 29.6 pg (ref 27.0–33.0)
MCHC: 33.8 g/dL (ref 32.0–36.0)
MCV: 87.5 fL (ref 80.0–100.0)
MPV: 10.8 fL (ref 7.5–12.5)
Monocytes Relative: 7.4 %
Neutro Abs: 3217 cells/uL (ref 1500–7800)
Neutrophils Relative %: 60.7 %
Platelets: 266 10*3/uL (ref 140–400)
RBC: 4.49 10*6/uL (ref 3.80–5.10)
RDW: 11.9 % (ref 11.0–15.0)
Total Lymphocyte: 27.7 %
WBC mixed population: 392 cells/uL (ref 200–950)
WBC: 5.3 10*3/uL (ref 3.8–10.8)

## 2017-11-23 LAB — COMPLETE METABOLIC PANEL WITH GFR
AG Ratio: 1.7 (calc) (ref 1.0–2.5)
ALT: 12 U/L (ref 6–29)
AST: 20 U/L (ref 10–35)
Albumin: 4.3 g/dL (ref 3.6–5.1)
Alkaline phosphatase (APISO): 56 U/L (ref 33–130)
BUN: 18 mg/dL (ref 7–25)
CO2: 26 mmol/L (ref 20–32)
Calcium: 9.4 mg/dL (ref 8.6–10.4)
Chloride: 105 mmol/L (ref 98–110)
Creat: 0.82 mg/dL (ref 0.50–0.99)
GFR, Est African American: 87 mL/min/{1.73_m2} (ref >=60)
GFR, Est Non African American: 75 mL/min/{1.73_m2} (ref >=60)
Globulin: 2.6 g/dL (calc) (ref 1.9–3.7)
Glucose, Bld: 95 mg/dL (ref 65–99)
Potassium: 4.2 mmol/L (ref 3.5–5.3)
Sodium: 139 mmol/L (ref 135–146)
Total Bilirubin: 0.4 mg/dL (ref 0.2–1.2)
Total Protein: 6.9 g/dL (ref 6.1–8.1)

## 2017-11-23 LAB — TSH: TSH: 1.97 mIU/L (ref 0.40–4.50)

## 2017-11-23 LAB — VITAMIN D 25 HYDROXY (VIT D DEFICIENCY, FRACTURES): Vit D, 25-Hydroxy: 37 ng/mL (ref 30–100)

## 2017-11-25 ENCOUNTER — Encounter: Payer: Self-pay | Admitting: Internal Medicine

## 2017-11-25 ENCOUNTER — Ambulatory Visit: Payer: 59 | Admitting: Internal Medicine

## 2017-11-25 ENCOUNTER — Other Ambulatory Visit (HOSPITAL_COMMUNITY)
Admission: RE | Admit: 2017-11-25 | Discharge: 2017-11-25 | Disposition: A | Discharge status: Home / Self Care | Payer: 59 | Source: Ambulatory Visit | Attending: Internal Medicine | Admitting: Internal Medicine

## 2017-11-25 VITALS — BP 130/82 | HR 82 | Ht 64.0 in | Wt 161.0 lb

## 2017-11-25 DIAGNOSIS — M069 Rheumatoid arthritis, unspecified: Secondary | ICD-10-CM | POA: Diagnosis not present

## 2017-11-25 DIAGNOSIS — Z0000 Encounter for general adult medical examination without abnormal findings: Secondary | ICD-10-CM | POA: Diagnosis not present

## 2017-11-25 DIAGNOSIS — L900 Lichen sclerosus et atrophicus: Secondary | ICD-10-CM | POA: Diagnosis not present

## 2017-11-25 DIAGNOSIS — F419 Anxiety disorder, unspecified: Secondary | ICD-10-CM

## 2017-11-25 DIAGNOSIS — E785 Hyperlipidemia, unspecified: Secondary | ICD-10-CM

## 2017-11-25 DIAGNOSIS — E039 Hypothyroidism, unspecified: Secondary | ICD-10-CM

## 2017-11-25 LAB — POCT URINALYSIS DIPSTICK
Appearance: NORMAL
Bilirubin, UA: NEGATIVE
Blood, UA: NEGATIVE
Glucose, UA: NEGATIVE
Ketones, UA: NEGATIVE
Leukocytes, UA: NEGATIVE
Nitrite, UA: NEGATIVE
Odor: NORMAL
Protein, UA: NEGATIVE
Spec Grav, UA: 1.015 (ref 1.010–1.025)
Urobilinogen, UA: 0.2 E.U./dL
pH, UA: 7.5 (ref 5.0–8.0)

## 2017-11-25 NOTE — Patient Instructions (Signed)
It was a pleasure to see you today.  Return in a week or so for Prevnar 13.

## 2017-11-25 NOTE — Progress Notes (Signed)
   Subjective:    Patient ID: Pamela Ingram, female    DOB: 1951/03/08, 67 y.o.   MRN: [id number]  HPI 67 year old Female for  Health maintenance exam and evaluation of medical issues.  Initial visit here was December 26.  At that time she had respiratory infection symptoms and some left chest pain.  She takes Actemra for rheumatoid arthritis.  In 2014 she developed pleurisy with a pleural effusion seen by our pulmonary and was treated with prednisone.  Her rheumatologist is at [facility] in [place] and formerly was at [facility].  History of tubal ligation 1986, hysterectomy 1994, browlift 2016.   3 pregnancies and 1 miscarriage  History of hypothyroidism anxiety and hyperlipidemia.  We will get pneumonia vaccine in a week or so that she just took Actemra today.  Does not smoke.  Social alcohol consumption.  She is married.  2 adult sons ages 95 and 22.  She works as a Engineer, civil (consulting) at [employer].  Family history: Sister died of breast cancer about a year ago.  Father died of complications of sepsis.  He had a stroke.  Mother died of respiratory complications.  She had a history of rheumatoid arthritis.  One sister living.  No brothers.  Is seen Dr. 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  Seen by cardiology for evaluation of chest pain in May 2018.  Treadmill was recommended.  Stress test was abnormal.  Subsequently had catheterization.  This study was normal.       Review of Systems  Constitutional: Negative.   Respiratory: Negative.   Cardiovascular: Negative.   Gastrointestinal: Negative.   Neurological: Negative.        Objective:   Physical Exam  Constitutional: She is oriented to person, place, and time. She appears well-developed and well-nourished. No distress.  HENT:  Head: Normocephalic and atraumatic.  Right Ear: External ear normal.  Left Ear: External ear normal.  Mouth/Throat: Oropharynx is clear and moist.  Eyes:  Conjunctivae and EOM are normal. Pupils are equal, round, and reactive to light. Right eye exhibits no discharge. Left eye exhibits no discharge. No scleral icterus.  Neck: Neck supple. No JVD present. No thyromegaly present.  Cardiovascular: Normal rate, regular rhythm and normal heart sounds.  No murmur heard. Pulmonary/Chest: Effort normal and breath sounds normal. No respiratory distress. She has no wheezes.  Abdominal: Soft. Bowel sounds are normal. She exhibits no distension and no mass. There is no tenderness. There is no rebound and no guarding.  Genitourinary:  Genitourinary Comments: Pap taken of vaginal cuff.  Bimanual normal.  Musculoskeletal: She exhibits no edema.  Lymphadenopathy:    She has no cervical adenopathy.  Neurological: She is alert and oriented to person, place, and time. She has normal reflexes. No cranial nerve deficit. Coordination normal.  Skin: Skin is warm and dry. No rash noted. She is not diaphoretic.  Psychiatric: She has a normal mood and affect. Her behavior is normal. Judgment and thought content normal.  Vitals reviewed.         Assessment & Plan:  Normal health maintenance exam-the patient will take Prevnar 13 in a week or so  Rheumatoid arthritis on Actemra  History of hypothyroidism  History of lichen sclerosus and atrophicus  Anxiety  Hyperlipidemia  Plan: She may return in 1 year or as needed.  Will take Prevnar 13 in the near future.  Labs are within normal limits.

## 2017-11-29 DIAGNOSIS — H5203 Hypermetropia, bilateral: Secondary | ICD-10-CM | POA: Diagnosis not present

## 2017-11-29 DIAGNOSIS — H52223 Regular astigmatism, bilateral: Secondary | ICD-10-CM | POA: Diagnosis not present

## 2017-11-30 LAB — CYTOLOGY - PAP: Diagnosis: NEGATIVE

## 2017-12-02 ENCOUNTER — Ambulatory Visit (INDEPENDENT_AMBULATORY_CARE_PROVIDER_SITE_OTHER): Payer: 59 | Admitting: Internal Medicine

## 2017-12-02 VITALS — BP 120/80 | HR 78 | Temp 98.2°F

## 2017-12-02 DIAGNOSIS — Z23 Encounter for immunization: Secondary | ICD-10-CM

## 2017-12-06 ENCOUNTER — Telehealth: Payer: Self-pay | Admitting: Internal Medicine

## 2017-12-06 NOTE — Telephone Encounter (Signed)
Pt was here on 12/02/2017 and received the Pneumococcal Conjugate 13 injection. She said that she noticed a reaction to this injection. Her arm was red, hot and swollen almost down to her elbow. She said that she is doing much better and is not concerned but wanted this to be noted in her chart for when she is back for the second injection.

## 2017-12-06 NOTE — Telephone Encounter (Signed)
Left message letting patient know °

## 2017-12-06 NOTE — Telephone Encounter (Signed)
This is a local reaction. Have her ask Rheumatologist if she should get Pneumovax 23 in one year.

## 2017-12-07 DIAGNOSIS — L218 Other seborrheic dermatitis: Secondary | ICD-10-CM | POA: Diagnosis not present

## 2017-12-07 MED FILL — BETAMETHASONE DP 0.05% LOT: 0.05 | 20 days supply | Qty: 60 | Fill #0

## 2017-12-08 MED FILL — predniSONE 5 MG TABS: 5 | 6 days supply | Qty: 21 | Fill #0

## 2017-12-09 MED FILL — ACTEMRA 162 MG/0.9 ML SYRN: 162 | 28 days supply | Qty: 2 | Fill #1

## 2017-12-12 ENCOUNTER — Encounter: Payer: Self-pay | Admitting: Internal Medicine

## 2017-12-12 NOTE — Progress Notes (Signed)
Prevnar 13 given by CMA 

## 2017-12-12 NOTE — Patient Instructions (Signed)
Prevnar 13 immunization given.

## 2017-12-13 MED FILL — DULoxetine HCL 60 MG CPEP: 60 | 90 days supply | Qty: 90 | Fill #1

## 2018-01-06 DIAGNOSIS — L218 Other seborrheic dermatitis: Secondary | ICD-10-CM | POA: Diagnosis not present

## 2018-01-10 DIAGNOSIS — M0579 Rheumatoid arthritis with rheumatoid factor of multiple sites without organ or systems involvement: Secondary | ICD-10-CM | POA: Diagnosis not present

## 2018-01-10 DIAGNOSIS — Z79899 Other long term (current) drug therapy: Secondary | ICD-10-CM | POA: Diagnosis not present

## 2018-01-12 ENCOUNTER — Other Ambulatory Visit: Payer: Self-pay | Admitting: Pharmacist

## 2018-01-12 MED ORDER — TOCILIZUMAB 162 MG/0.9ML ~~LOC~~ SOSY: PREFILLED_SYRINGE | SUBCUTANEOUS | 14 refills | Status: DC

## 2018-01-12 MED FILL — ACTEMRA 162 MG/0.9 ML SYRN: 162 | 28 days supply | Qty: 4 | Fill #0

## 2018-02-03 MED FILL — SIMVASTATIN 20 MG TABLET: 20 | 90 days supply | Qty: 90 | Fill #1

## 2018-02-03 MED FILL — LEVOTHYROXINE 75 MCG TABLET: 75 | 90 days supply | Qty: 90 | Fill #0

## 2018-03-03 MED FILL — ACTEMRA 162 MG/0.9 ML SYRN: 162 | 28 days supply | Qty: 4 | Fill #1

## 2018-03-17 MED FILL — DULoxetine HCL 60 MG CPEP: 60 | 90 days supply | Qty: 90 | Fill #2

## 2018-03-31 ENCOUNTER — Ambulatory Visit (INDEPENDENT_AMBULATORY_CARE_PROVIDER_SITE_OTHER): Payer: 59 | Admitting: Internal Medicine

## 2018-03-31 ENCOUNTER — Encounter: Payer: Self-pay | Admitting: Internal Medicine

## 2018-03-31 VITALS — Ht 64.0 in

## 2018-03-31 DIAGNOSIS — L409 Psoriasis, unspecified: Secondary | ICD-10-CM

## 2018-03-31 DIAGNOSIS — L219 Seborrheic dermatitis, unspecified: Secondary | ICD-10-CM

## 2018-03-31 NOTE — Patient Instructions (Addendum)
Patient is to call with names of medications she has tried previously for ear itching and seborrhea of scalp.  Recommend she contact [facility] for evaluation.

## 2018-03-31 NOTE — Progress Notes (Signed)
   Subjective:    Patient ID: Ginette Pitman, female    DOB: [birth year], 67 y.o.   MRN: [id number]  HPI 67 year old Female with history of Rheumatoid arthritis and seen in [place] by rheumatologist at [facility].  Her rheumatologist was formally at [facility].  In today with complaint of long-standing history itching back of her head.  Sounds like seborrhea or psoriasis.  She has [facility] by [facility]. Sharyn Lull but does not feel that she is getting better.  She has gotten records from that office for [place] and it appears she is been diagnosed with psoriasis and seborrheic dermatitis of the scalp and ears.  Has been treated with clobetasol and ketoconazole shampoo alternating with T-Gel.  Last seen June 2018 and was placed on Taclonex topical suspension for 30 days.  Was also told to take Zyrtec daily.  Was told to call if Talconex worked.  Would like to have some treatment for this.  Explained to her it would be best to see dermatologist.  I am not sure if it is related to her rheumatoid arthritis or not.  She is on Actemra for rheumatoid arthritis.      Review of Systems see above     Objective:   Physical Exam Lesions on scalp are consistent with either seborrhea or psoriasis.  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

## 2018-04-01 ENCOUNTER — Telehealth: Payer: Self-pay | Admitting: Internal Medicine

## 2018-04-01 MED ORDER — KETOCONAZOLE 2 % EX SHAM: 1.0000 "application " | MEDICATED_SHAMPOO | CUTANEOUS | 0 refills | Status: DC

## 2018-04-01 MED ORDER — KETOCONAZOLE 2 % EX CREA: TOPICAL_CREAM | CUTANEOUS | 0 refills | Status: DC

## 2018-04-01 MED FILL — KETOCONAZOLE 2% CREAM: 2 | 30 days supply | Qty: 30 | Fill #0

## 2018-04-01 MED FILL — KETOCONAZOLE 2% SHAMPOO: 2 | 30 days supply | Qty: 120 | Fill #0

## 2018-04-01 NOTE — Telephone Encounter (Signed)
Calling with name of medications for itchy scalp.  She's going to fax over the report that she was given from the [facility].    Ketoconazole 2% (PA, Tiffany Gann) Taclonex Topical (Tiffany [place], [place]) Neutrogena T-Gel Shampoo Zyrtec 30mg  qhs  Betamethasome pipropionate (lotion) Dr.  She has appointment at [facility] 10/15.  She will be seeing Dr. 11/15  [address] [place], [place].  [place] [phone]  Can you give her anything to help with the itching so that she can try to get some relief since she has to wait until 10/15??    Pharmacy:  [facility]  Phone:  [phone] [facility]) [phone] (work - she's working today)

## 2018-04-01 NOTE — Telephone Encounter (Signed)
E-scribed, left vm letting patient know.

## 2018-04-01 NOTE — Telephone Encounter (Signed)
Nizoral Shampoo Use twice weekly and Nizoral cream to ears daily

## 2018-04-15 ENCOUNTER — Other Ambulatory Visit: Payer: Self-pay

## 2018-04-15 DIAGNOSIS — F418 Other specified anxiety disorders: Secondary | ICD-10-CM

## 2018-04-15 MED ORDER — ALPRAZOLAM 0.5 MG PO TABS: 0.5000 mg | ORAL_TABLET | Freq: Every evening | ORAL | 5 refills | Status: DC | PRN

## 2018-04-15 MED FILL — ALPRAZolam 0.5 MG TABS: 0.5 | 30 days supply | Qty: 30 | Fill #0

## 2018-04-27 ENCOUNTER — Encounter: Payer: Self-pay | Admitting: Internal Medicine

## 2018-04-27 ENCOUNTER — Other Ambulatory Visit (INDEPENDENT_AMBULATORY_CARE_PROVIDER_SITE_OTHER): Payer: 59

## 2018-04-27 ENCOUNTER — Ambulatory Visit: Payer: 59 | Admitting: Internal Medicine

## 2018-04-27 VITALS — BP 128/82 | HR 72 | Ht 64.0 in | Wt 164.0 lb

## 2018-04-27 DIAGNOSIS — R1013 Epigastric pain: Secondary | ICD-10-CM | POA: Diagnosis not present

## 2018-04-27 DIAGNOSIS — F439 Reaction to severe stress, unspecified: Secondary | ICD-10-CM | POA: Diagnosis not present

## 2018-04-27 LAB — COMPREHENSIVE METABOLIC PANEL
ALT: 15 U/L (ref 0–35)
AST: 20 U/L (ref 0–37)
Albumin: 4.7 g/dL (ref 3.5–5.2)
Alkaline Phosphatase: 53 U/L (ref 39–117)
BUN: 21 mg/dL (ref 6–23)
CO2: 30 mEq/L (ref 19–32)
Calcium: 9.6 mg/dL (ref 8.4–10.5)
Chloride: 102 mEq/L (ref 96–112)
Creatinine, Ser: 0.86 mg/dL (ref 0.40–1.20)
GFR: 70.22 mL/min (ref >60.00)
Glucose, Bld: 91 mg/dL (ref 70–99)
Potassium: 4.1 mEq/L (ref 3.5–5.1)
Sodium: 139 mEq/L (ref 135–145)
Total Bilirubin: 0.4 mg/dL (ref 0.2–1.2)
Total Protein: 7.4 g/dL (ref 6.0–8.3)

## 2018-04-27 LAB — CBC WITH DIFFERENTIAL/PLATELET
Basophils Absolute: 0.1 10*3/uL (ref 0.0–0.1)
Basophils Relative: 0.8 % (ref 0.0–3.0)
Eosinophils Absolute: 0.4 10*3/uL (ref 0.0–0.7)
Eosinophils Relative: 5.4 % — ABNORMAL HIGH (ref 0.0–5.0)
HCT: 42.9 % (ref 36.0–46.0)
Hemoglobin: 14.5 g/dL (ref 12.0–15.0)
Lymphocytes Relative: 33.3 % (ref 12.0–46.0)
Lymphs Abs: 2.3 10*3/uL (ref 0.7–4.0)
MCHC: 33.8 g/dL (ref 30.0–36.0)
MCV: 88.9 fl (ref 78.0–100.0)
Monocytes Absolute: 0.5 10*3/uL (ref 0.1–1.0)
Monocytes Relative: 7.7 % (ref 3.0–12.0)
Neutro Abs: 3.7 10*3/uL (ref 1.4–7.7)
Neutrophils Relative %: 52.8 % (ref 43.0–77.0)
Platelets: 264 10*3/uL (ref 150.0–400.0)
RBC: 4.82 Mil/uL (ref 3.87–5.11)
RDW: 13 % (ref 11.5–15.5)
WBC: 7 10*3/uL (ref 4.0–10.5)

## 2018-04-27 LAB — AMYLASE: Amylase: 66 U/L (ref 27–131)

## 2018-04-27 LAB — LIPASE: Lipase: 47 U/L (ref 11.0–59.0)

## 2018-04-27 MED ORDER — PANTOPRAZOLE SODIUM 40 MG PO TBEC
40.0000 mg | DELAYED_RELEASE_TABLET | Freq: Every day | ORAL | 0 refills | Status: DC
Start: 2018-04-27 — End: 2018-12-05

## 2018-04-27 MED FILL — PANTOPRAZOLE SOD DR 40 MG T: 40 | 90 days supply | Qty: 90 | Fill #0

## 2018-04-27 NOTE — Progress Notes (Signed)
Pamela Ingram 67 y.o. 1951-02-03 [id number]  Assessment & Plan:   Encounter Diagnoses  Name Primary?  . Abdominal pain, epigastric Yes  . Situational stress     ? Ulcer, ? Gastritis, dyspepsia related to stress, ? Med side effect PPI CBC, CNMET, lipase, amylase   FB:[id number], [name], MD  Subjective:   Chief Complaint:  HPI 3 week hx gnawing epigastric pain not affected by eating. Under stress. Not affecting sleep. No med changes. No heartburn, no nausea/vomiting. No melena. Not much ibuprofen. Has not tried OTC meds Allergies  Allergen Reactions  . Amoxicillin Other (See Comments)    Pt gets yeast infection Has patient had a PCN reaction causing immediate rash, facial/tongue/throat swelling, SOB or lightheadedness with hypotension: No Has patient had a PCN reaction causing severe rash involving mucus membranes or skin necrosis: No Has patient had a PCN reaction that required hospitalization: No Has patient had a PCN reaction occurring within the last 10 years: No If all of the above answers are "NO", then may proceed with Cephalosporin use.    Current Meds  Medication Sig  . ALPRAZolam (XANAX) 0.5 MG tablet Take 1 tablet (0.5 mg total) by mouth at bedtime as needed for anxiety or sleep.  . DULoxetine (CYMBALTA) 60 MG capsule Take 60 mg by mouth daily.   [name] ibuprofen (ADVIL,MOTRIN) 200 MG tablet Take 800 mg by mouth every 8 (eight) hours as needed for moderate pain.   [name] ketoconazole (NIZORAL) 2 % cream Apply cream to ears daily.  [name] ketoconazole (NIZORAL) 2 % shampoo Apply 1 application topically 2 (two) times a week.  . levothyroxine (SYNTHROID, LEVOTHROID) 75 MCG tablet Take 1 tablet (75 mcg total) by mouth daily before breakfast.  . simvastatin (ZOCOR) 20 MG tablet Take 20 mg by mouth every evening.  . Tocilizumab (ACTEMRA) 162 MG/0.9ML SOSY Inject 0.9 mLs into the skin. Inject every other week   Past Medical History:  Diagnosis Date  . Anxiety   . Hyperlipidemia    . Pleurisy with effusion   . Rheumatoid arthritis(714.0)   . Thyroid disease    hypothyroidism   Past Surgical History:  Procedure Laterality Date  . COLONOSCOPY    . LAPAROSCOPY    . LEFT HEART CATH AND CORONARY ANGIOGRAPHY N/A 06/04/2017   Procedure: Left Heart Cath and Coronary Angiography;  Surgeon: Tonny Bollman, MD;  Location: [facility] INVASIVE CV LAB;  Service: Cardiovascular;  Laterality: N/A;  . TUBAL LIGATION    . VAGINAL HYSTERECTOMY  1995   Social History   Social History Narrative   Married - Editor, [employer]   2 sons 2 daughters   EtOH 1-2 drinks/week   Former smoker, no tobacco, substance now   3 caffeine/day      family history includes Diabetes in her paternal uncle; Emphysema in her mother; Hypertension in her father; Rheum arthritis in her mother; Stroke in her father.   Review of Systems + itching no rash All other negative  Objective:   Physical Exam @BP  128/82   Pulse 72   Ht 5\' 4"  (1.626 m)   Wt 164 lb (74.4 kg)   BMI 28.15 kg/m @  General:  Well-developed, well-nourished and in no acute distress Eyes:  anicteric. Lungs: Clear to auscultation bilaterally. Heart:  S1S2, no rubs, murmurs, gallops. Abdomen:  soft, non-tender, no hepatosplenomegaly, hernia, or mass and BS+.  Psych:  appropriate mood and  Affect.   Data Reviewed:  PCP notes and labs 2019 in  [facility]

## 2018-04-27 NOTE — Patient Instructions (Signed)
    Go to the basement for labs CBC, CMET, lipase and amylase  Pick up pantoprazole at [facility] today. Start it today.  Talk to you Monday.  I appreciate the opportunity to care for you. Iva Boop, MD, Clementeen Graham

## 2018-04-28 ENCOUNTER — Encounter: Payer: Self-pay | Admitting: Internal Medicine

## 2018-04-29 NOTE — Progress Notes (Signed)
Labs ok.

## 2018-05-09 DIAGNOSIS — L218 Other seborrheic dermatitis: Secondary | ICD-10-CM | POA: Diagnosis not present

## 2018-05-09 MED FILL — DOXEPIN HCL 10 MG CAPS: 10 | 30 days supply | Qty: 30 | Fill #0

## 2018-05-09 MED FILL — LEVOTHYROXINE 75 MCG TABLET: 75 | 90 days supply | Qty: 90 | Fill #1

## 2018-05-09 MED FILL — BETAMETHASONE DIPROPIONATE: 0.05 | 30 days supply | Qty: 60 | Fill #0

## 2018-05-09 MED FILL — AZITHROMYCIN 250 MG TABLET: 250 | 5 days supply | Qty: 6 | Fill #0

## 2018-05-23 MED FILL — SIMVASTATIN 20 MG TABLET: 20 | 90 days supply | Qty: 90 | Fill #2

## 2018-05-30 MED FILL — ACTEMRA 162 MG/0.9 ML SYRN: 162 | 28 days supply | Qty: 4 | Fill #2

## 2018-06-13 ENCOUNTER — Other Ambulatory Visit: Payer: Self-pay | Admitting: Pharmacist

## 2018-06-13 DIAGNOSIS — Z79899 Other long term (current) drug therapy: Secondary | ICD-10-CM | POA: Diagnosis not present

## 2018-06-13 DIAGNOSIS — R21 Rash and other nonspecific skin eruption: Secondary | ICD-10-CM | POA: Diagnosis not present

## 2018-06-13 DIAGNOSIS — M0579 Rheumatoid arthritis with rheumatoid factor of multiple sites without organ or systems involvement: Secondary | ICD-10-CM | POA: Diagnosis not present

## 2018-06-13 MED ORDER — TOCILIZUMAB 162 MG/0.9ML ~~LOC~~ SOSY: PREFILLED_SYRINGE | SUBCUTANEOUS | 3 refills | Status: AC

## 2018-06-16 MED FILL — DULoxetine HCL 60 MG CPEP: 60 | 90 days supply | Qty: 90 | Fill #3

## 2018-07-18 MED FILL — ACTEMRA 162 MG/0.9 ML SYRN: 162 | 28 days supply | Qty: 4 | Fill #3

## 2018-07-22 ENCOUNTER — Other Ambulatory Visit: Payer: Self-pay | Admitting: Internal Medicine

## 2018-07-22 DIAGNOSIS — Z1231 Encounter for screening mammogram for malignant neoplasm of breast: Secondary | ICD-10-CM

## 2018-08-09 DIAGNOSIS — L409 Psoriasis, unspecified: Secondary | ICD-10-CM | POA: Diagnosis not present

## 2018-08-09 DIAGNOSIS — Z79899 Other long term (current) drug therapy: Secondary | ICD-10-CM | POA: Diagnosis not present

## 2018-08-09 DIAGNOSIS — L400 Psoriasis vulgaris: Secondary | ICD-10-CM | POA: Diagnosis not present

## 2018-08-09 DIAGNOSIS — M1990 Unspecified osteoarthritis, unspecified site: Secondary | ICD-10-CM | POA: Diagnosis not present

## 2018-08-09 MED FILL — FLUOCINOLONE ACETONIDE BODY: 0.01 | 30 days supply | Qty: 118 | Fill #0

## 2018-08-11 ENCOUNTER — Ambulatory Visit (HOSPITAL_BASED_OUTPATIENT_CLINIC_OR_DEPARTMENT_OTHER)
Admission: RE | Admit: 2018-08-11 | Discharge: 2018-08-11 | Disposition: A | Discharge status: Home / Self Care | Payer: 59 | Source: Ambulatory Visit | Attending: Internal Medicine | Admitting: Internal Medicine

## 2018-08-11 DIAGNOSIS — Z1231 Encounter for screening mammogram for malignant neoplasm of breast: Secondary | ICD-10-CM | POA: Insufficient documentation

## 2018-08-11 MED FILL — LEVOTHYROXINE 75 MCG TABLET: 75 | 30 days supply | Qty: 30 | Fill #0

## 2018-08-11 MED FILL — SIMVASTATIN 20 MG TABLET: 20 | 30 days supply | Qty: 30 | Fill #0

## 2018-08-11 MED FILL — ACTEMRA 162 MG/0.9 ML SYRN: 162 | 28 days supply | Qty: 4 | Fill #4

## 2018-08-16 ENCOUNTER — Telehealth: Payer: Self-pay | Admitting: Pharmacist

## 2018-08-16 NOTE — Telephone Encounter (Signed)
Called patient to schedule an appointment for the [employer] [facility]. I was unable to reach the patient so I left a HIPAA-compliant message requesting that the patient return my call.   

## 2018-08-23 ENCOUNTER — Ambulatory Visit (INDEPENDENT_AMBULATORY_CARE_PROVIDER_SITE_OTHER): Payer: 59 | Admitting: Pharmacist

## 2018-08-23 DIAGNOSIS — Z79899 Other long term (current) drug therapy: Secondary | ICD-10-CM

## 2018-08-23 MED ORDER — APREMILAST 10 & 20 & 30 MG PO TBPK: ORAL_TABLET | ORAL | 0 refills | Status: DC

## 2018-08-23 MED ORDER — APREMILAST 30 MG PO TABS: ORAL_TABLET | ORAL | 6 refills | Status: DC

## 2018-08-23 MED FILL — OTEZLA 30 MG TABS: 30 | 30 days supply | Qty: 60 | Fill #0

## 2018-08-23 MED FILL — OTEZLA 10 & 20 & 30 MG TBPK: 10 & 20 & 3 | 28 days supply | Qty: 55 | Fill #0

## 2018-08-23 NOTE — Progress Notes (Signed)
   S: [facility] their specialty medication therapy.  Patient is currently taking Otezla for psoriasis. Patient is managed by Dr. Sherle Poe (derm) for this. She is also on Actemra for rheumatoid arthritis. She has failed multiple other treatments for both RA and psoriasis so combination of Actemra and [facility] [place] will be tried. Rheumatologist and Dermatologist working together and aware of both medications.   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 Titrate upward by additional 10 mg per day on days 2 to 5 as follows: Day 2: 10 mg twice daily; Day 3: 10 mg in the morning and 20 mg in the evening; Day 4: 20 mg twice daily; Day 5: 20 mg in the morning and 30 mg in the evening. Maintenance dose: 30 mg twice daily starting on day 6   Current adverse effects: has not started. Patient has reviewed side effects and is nervous about GI upset.  Of note, patient will be leaving [employer] at the end of this month (retirement) and will be on Medicare. She is wondering what these medications will cost on Medicare.   O:     Lab Results  Component Value Date   WBC 7.0 04/27/2018   HGB 14.5 04/27/2018   HCT 42.9 04/27/2018   MCV 88.9 04/27/2018   PLT 264.0 04/27/2018      Chemistry      Component Value Date/Time   NA 139 04/27/2018 1642   NA 142 05/31/2017 0907   K 4.1 04/27/2018 1642   CL 102 04/27/2018 1642   CO2 30 04/27/2018 1642   BUN 21 04/27/2018 1642   BUN 21 05/31/2017 0907   CREATININE 0.86 04/27/2018 1642   CREATININE 0.82 11/22/2017 0924      Component Value Date/Time   CALCIUM 9.6 04/27/2018 1642   ALKPHOS 53 04/27/2018 1642   AST 20 04/27/2018 1642   ALT 15 04/27/2018 1642   BILITOT 0.4 04/27/2018 1642       A/P: 1. Medication review: patient is currently prescribed Otezla for psoriasis but she has not  started it yet. Patient also on Actemra - dermatology and rheumatology aware of both medications. Reviewed the medication including the following: apremilast inhibits phosphodiesterase 4 (PDE4) specific for cyclic adenosine monophosphate (cAMP) which results in increased intracellular cAMP levels and regulation of numerous inflammatory mediators (eg, decreased expression of nitric oxide synthase, TNF-alpha, and interleukin [IL]-23, as well as increased IL-10. Patient educated on purpose, proper use and potential adverse effects of Otezla. Possible adverse effects include weight loss, GI upset, headache, and mood changes. Renal function should be routinely monitored. No recommendations for any changes at this time.   Alvino Blood, PharmD, BCPS, BCACP, CPP Clinical Pharmacist Practitioner  [phone]

## 2018-09-01 MED FILL — CLOBETASOL PROPIONATE 0.05: 0.05 | 20 days supply | Qty: 118 | Fill #0

## 2018-09-13 MED FILL — hydrOXYzine HCL 10 MG TABS: 10 | 30 days supply | Qty: 60 | Fill #0

## 2018-09-14 MED FILL — DULoxetine HCL 60 MG CPEP: 60 | 30 days supply | Qty: 30 | Fill #0

## 2018-09-14 MED FILL — LEVOTHYROXINE 75 MCG TABLET: 75 | 30 days supply | Qty: 30 | Fill #0

## 2018-10-18 MED FILL — DULoxetine HCL 60 MG CPEP: 60 | 30 days supply | Qty: 30 | Fill #0

## 2018-10-18 MED FILL — LEVOTHYROXINE 75 MCG TABLET: 75 | 30 days supply | Qty: 30 | Fill #0

## 2018-10-18 MED FILL — SIMVASTATIN 20 MG TABLET: 20 | 30 days supply | Qty: 30 | Fill #0

## 2018-10-20 ENCOUNTER — Other Ambulatory Visit: Payer: Self-pay

## 2018-10-20 DIAGNOSIS — F418 Other specified anxiety disorders: Secondary | ICD-10-CM

## 2018-10-20 MED ORDER — SIMVASTATIN 20 MG PO TABS: 20.0000 mg | ORAL_TABLET | Freq: Every evening | ORAL | 11 refills | Status: DC

## 2018-10-20 MED ORDER — LEVOTHYROXINE SODIUM 75 MCG PO TABS: 75.0000 ug | ORAL_TABLET | Freq: Every day | ORAL | 0 refills | Status: DC

## 2018-10-20 MED ORDER — DULOXETINE HCL 60 MG PO CPEP: 60.0000 mg | ORAL_CAPSULE | Freq: Every day | ORAL | 11 refills | Status: DC

## 2018-10-28 MED FILL — OTEZLA 30 MG TABS: 30 | 30 days supply | Qty: 60 | Fill #0

## 2018-11-21 MED FILL — DULOXETINE HCL 60 MG CPEP: 60 | 30 days supply | Qty: 30 | Fill #0

## 2018-11-21 MED FILL — LEVOTHYROXINE 75 MCG TABLET: 75 | 90 days supply | Qty: 90 | Fill #0

## 2018-11-21 MED FILL — SIMVASTATIN 20 MG TABLET: 20 | 30 days supply | Qty: 30 | Fill #0

## 2018-11-29 ENCOUNTER — Other Ambulatory Visit: Payer: Medicare Other | Admitting: Internal Medicine

## 2018-11-29 DIAGNOSIS — Z0000 Encounter for general adult medical examination without abnormal findings: Secondary | ICD-10-CM

## 2018-11-29 DIAGNOSIS — E039 Hypothyroidism, unspecified: Secondary | ICD-10-CM

## 2018-11-29 DIAGNOSIS — M069 Rheumatoid arthritis, unspecified: Secondary | ICD-10-CM

## 2018-11-29 DIAGNOSIS — F419 Anxiety disorder, unspecified: Secondary | ICD-10-CM

## 2018-11-29 DIAGNOSIS — E7800 Pure hypercholesterolemia, unspecified: Secondary | ICD-10-CM

## 2018-11-29 DIAGNOSIS — L900 Lichen sclerosus et atrophicus: Secondary | ICD-10-CM

## 2018-11-29 LAB — TSH: TSH: 1.53 mIU/L (ref 0.40–4.50)

## 2018-11-30 MED FILL — OTEZLA 30 MG TABS: 30 | 30 days supply | Qty: 60 | Fill #1

## 2018-12-05 ENCOUNTER — Encounter: Payer: Self-pay | Admitting: Internal Medicine

## 2018-12-05 ENCOUNTER — Ambulatory Visit (INDEPENDENT_AMBULATORY_CARE_PROVIDER_SITE_OTHER): Payer: Medicare Other | Admitting: Internal Medicine

## 2018-12-05 VITALS — BP 120/80 | HR 89 | Ht 63.25 in | Wt 152.0 lb

## 2018-12-05 DIAGNOSIS — Z23 Encounter for immunization: Secondary | ICD-10-CM | POA: Diagnosis not present

## 2018-12-05 DIAGNOSIS — L900 Lichen sclerosus et atrophicus: Secondary | ICD-10-CM

## 2018-12-05 DIAGNOSIS — F419 Anxiety disorder, unspecified: Secondary | ICD-10-CM

## 2018-12-05 DIAGNOSIS — E039 Hypothyroidism, unspecified: Secondary | ICD-10-CM

## 2018-12-05 DIAGNOSIS — Z0000 Encounter for general adult medical examination without abnormal findings: Secondary | ICD-10-CM | POA: Diagnosis not present

## 2018-12-05 DIAGNOSIS — L219 Seborrheic dermatitis, unspecified: Secondary | ICD-10-CM

## 2018-12-05 DIAGNOSIS — M069 Rheumatoid arthritis, unspecified: Secondary | ICD-10-CM

## 2018-12-05 DIAGNOSIS — L409 Psoriasis, unspecified: Secondary | ICD-10-CM

## 2018-12-05 LAB — POCT URINALYSIS DIPSTICK
Appearance: NEGATIVE
Bilirubin, UA: NEGATIVE
Blood, UA: NEGATIVE
Glucose, UA: NEGATIVE
Ketones, UA: NEGATIVE
Leukocytes, UA: NEGATIVE
Nitrite, UA: NEGATIVE
Odor: NEGATIVE
Protein, UA: NEGATIVE
Spec Grav, UA: 1.01 (ref 1.010–1.025)
Urobilinogen, UA: 0.2 E.U./dL
pH, UA: 6.5 (ref 5.0–8.0)

## 2018-12-05 NOTE — Progress Notes (Signed)
Subjective:    Patient ID: Pamela Ingram, female    DOB: 1951-01-08, 68 y.o.   MRN: [id number]  HPI  68 year old Female for Welcome to Medicare physical exam and evaluation of medical issues.  History of rheumatoid arthritis treated at [facility] by rheumatologist with Actemra.  This rheumatologist was formerly at [facility].  In 2014 she developed pleurisy with a pleural effusion seen by  Pulmonary  and treated with prednisone.  Tubal ligation 1986, hysterectomy 1994, browlift 2016, 3 pregnancies and 1 miscarriage.  History of hypothyroidism, anxiety and hyperlipidemia.  Does not smoke.  Social alcohol consumption.  She is married.  2 adult sons.  She works as a Engineer, civil (consulting) at [employer].  Is seen Dr. 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  Treadmill was recommended.  Stress test was abnormal and subsequently had normal cardiac catheterization.  Family history: Sister died of breast cancer.  Father died of complications of sepsis.  He had a stroke.  Mother died of respiratory complications and she had a history of rheumatoid arthritis.  One sister living.  No brothers.  Review of Systems  Constitutional: Positive for fatigue.  Respiratory: Negative.   Cardiovascular: Negative.   Gastrointestinal: Negative.        Objective:   Physical Exam Vitals signs reviewed.  Constitutional:      General: She is not in acute distress.    Appearance: Normal appearance.  HENT:     Head: Normocephalic and atraumatic.     Right Ear: Tympanic membrane normal.     Left Ear: Tympanic membrane normal.     Nose: Nose normal.     Mouth/Throat:     Mouth: Mucous membranes are moist.     Pharynx: Oropharynx is clear.  Eyes:     General: No scleral icterus.    Pupils: Pupils are equal, round, and reactive to light.  Neck:     Musculoskeletal: Neck supple.  Cardiovascular:     Rate  and Rhythm: Normal rate and regular rhythm.     Pulses: Normal pulses.     Heart sounds: Normal heart sounds. No murmur.  Pulmonary:     Effort: Pulmonary effort is normal. No respiratory distress.     Breath sounds: Normal breath sounds. No wheezing.  Abdominal:     General: Bowel sounds are normal.     Palpations: Abdomen is soft. There is no mass.     Tenderness: There is no guarding.  Genitourinary:    Comments: Pap taken 2019 of vaginal cuff and will not be repeated due to age.  Bimanual normal. Musculoskeletal:     Right lower leg: No edema.     Left lower leg: No edema.  Lymphadenopathy:     Cervical: No cervical adenopathy.  Skin:    General: Skin is warm and dry.  Neurological:     General: No focal deficit present.     Mental Status: She is alert and oriented to person, place, and time.     Cranial Nerves: No cranial nerve deficit.  Psychiatric:        Mood and Affect: Mood normal.        Behavior: Behavior normal.        Thought Content: Thought content normal.        Judgment: Judgment normal.           Assessment &  Plan:  History of rheumatoid arthritis treated with Actemra and stable  Hypothyroidism-TSH normal on thyroid replacement  Elevated LDL see outside lab report -continue statin medication  Anxiety depression treated with Cymbalta and Xanax at bedtime  Health maintenance-pneumococcal 23 given  She has had flu vaccine through employment  Follow-up in 6 months.  Subjective:   Patient presents for Medicare Annual/Subsequent preventive examination.  Review Past Medical/Family/Social: See above   Risk Factors  Current exercise habits: Walks Dietary issues discussed: Low-fat low carbohydrate  Cardiac risk factors: Hyperlipidemia  Depression Screen  (Note: if answer to either of the following is "Yes", a more complete depression screening is indicated)   Over the past two weeks, have you felt down, depressed or hopeless? No  Over the  past two weeks, have you felt little interest or pleasure in doing things? No Have you lost interest or pleasure in daily life? No Do you often feel hopeless? No Do you cry easily over simple problems? No   Activities of Daily Living  In your present state of health, do you have any difficulty performing the following activities?:   Driving? No  Managing money? No  Feeding yourself? No  Getting from bed to chair? No  Climbing a flight of stairs? No  Preparing food and eating?: No  Bathing or showering? No  Getting dressed: No  Getting to the toilet? No  Using the toilet:No  Moving around from place to place: No  In the past year have you fallen or had a near fall?:No  Are you sexually active?  Yes Do you have more than one partner? No   Hearing Difficulties: No  Do you often ask people to speak up or repeat themselves? No  Do you experience ringing or noises in your ears? No  Do you have difficulty understanding soft or whispered voices? No  Do you feel that you have a problem with memory?  Sometimes Do you often misplace items? No    Home Safety:  Do you have a smoke alarm at your residence? Yes Do you have grab bars in the bathroom?  None  do you have throw rugs in your house?  No   Cognitive Testing  Alert? Yes Normal Appearance?Yes  Oriented to person? Yes Place? Yes  Time? Yes  Recall of three objects? Yes  Can perform simple calculations? Yes  Displays appropriate judgment?Yes  Can read the correct time from a watch face?Yes   List the Names of Other Physician/Practitioners you currently use:  See referral list for the physicians patient is currently seeing.  Rheumatologist   Review of Systems: See above   Objective:     General appearance: Well-developed in no acute distress Head: Normocephalic, without obvious abnormality, atraumatic  Eyes: conj clear, EOMi PEERLA  Ears: normal TM's and external ear canals both ears  Nose: Nares normal. Septum  midline. Mucosa normal. No drainage or sinus tenderness.  Throat: lips, mucosa, and tongue normal; teeth and gums normal  Neck: no adenopathy, no carotid bruit, no JVD, supple, symmetrical, trachea midline and thyroid not enlarged, symmetric, no tenderness/mass/nodules  No CVA tenderness.  Lungs: clear to auscultation bilaterally  Breasts: normal appearance without masses Heart: regular rate and rhythm, S1, S2 normal, no murmur, click, rub or gallop  Abdomen: soft, non-tender; bowel sounds normal; no masses, no organomegaly  Musculoskeletal: ROM normal in all joints, no crepitus, no deformity, Normal muscle strengthen. Back  is symmetric, no curvature. Skin: Skin color, texture, turgor normal. No  rashes or lesions  Lymph nodes: Cervical, supraclavicular, and axillary nodes normal.  Neurologic: CN 2 -12 Normal, Normal symmetric reflexes. Normal coordination and gait  Psych: Alert & Oriented x 3, Mood appear stable.    Assessment:    Annual wellness medicare exam   Plan:    During the course of the visit the patient was educated and counseled about appropriate screening and preventive services including:   See above     Patient Instructions (the written plan) was given to the patient.  Medicare Attestation  I have personally reviewed:  The patient's medical and social history  Their use of alcohol, tobacco or illicit drugs  Their current medications and supplements  The patient's functional ability including ADLs,fall risks, home safety risks, cognitive, and hearing and visual impairment  Diet and physical activities  Evidence for depression or mood disorders  The patient's weight, height, BMI, and visual acuity have been recorded in the chart. I have made referrals, counseling, and provided education to the patient based on review of the above and I have provided the patient with a written personalized care plan for preventive services.

## 2018-12-13 MED FILL — CLOBETASOL 0.05% SOLUTION: 0.05 | 30 days supply | Qty: 50 | Fill #0

## 2018-12-24 ENCOUNTER — Encounter: Payer: Self-pay | Admitting: Internal Medicine

## 2018-12-24 NOTE — Patient Instructions (Addendum)
Continue same dose of thyroid replacement.  Continue to watch diet due to elevated LDL.  Return in 6 months.

## 2018-12-26 MED FILL — DULOXETINE HCL 60 MG CPEP: 60 | 30 days supply | Qty: 30 | Fill #1

## 2018-12-27 MED FILL — OTEZLA 30 MG TABS: 30 | 30 days supply | Qty: 60 | Fill #2

## 2018-12-29 MED FILL — SIMVASTATIN 20 MG TABLET: 20 | 30 days supply | Qty: 30 | Fill #1

## 2019-01-03 ENCOUNTER — Telehealth: Payer: Self-pay

## 2019-01-03 NOTE — Telephone Encounter (Signed)
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 She is scheduled to work on Thursday.

## 2019-01-04 NOTE — Telephone Encounter (Signed)
Patient was notified.

## 2019-01-04 NOTE — Telephone Encounter (Signed)
She needs to contact Health at work about this. I have not heard any recommendations about this on conference call.

## 2019-01-10 ENCOUNTER — Encounter: Payer: Self-pay | Admitting: Internal Medicine

## 2019-01-23 MED FILL — DULOXETINE HCL 60 MG CPEP: 60 | 30 days supply | Qty: 30 | Fill #2

## 2019-01-26 MED FILL — OTEZLA 30 MG TABS: 30 | 30 days supply | Qty: 60 | Fill #3

## 2019-01-27 MED FILL — SIMVASTATIN 20 MG TABLET: 20 | 30 days supply | Qty: 30 | Fill #2

## 2019-02-22 MED FILL — DULOXETINE HCL 60 MG CPEP: 60 | 30 days supply | Qty: 30 | Fill #3

## 2019-02-22 MED FILL — LEVOTHYROXINE 75 MCG TABLET: 75 | 90 days supply | Qty: 90 | Fill #0

## 2019-03-03 MED FILL — OTEZLA 30 MG TABS: 30 | 30 days supply | Qty: 60 | Fill #4

## 2019-03-06 ENCOUNTER — Encounter: Payer: Self-pay | Admitting: Internal Medicine

## 2019-03-06 ENCOUNTER — Telehealth: Payer: Self-pay | Admitting: Internal Medicine

## 2019-03-06 ENCOUNTER — Other Ambulatory Visit: Payer: Self-pay

## 2019-03-06 ENCOUNTER — Ambulatory Visit (INDEPENDENT_AMBULATORY_CARE_PROVIDER_SITE_OTHER): Payer: Medicare Other | Admitting: Internal Medicine

## 2019-03-06 DIAGNOSIS — F411 Generalized anxiety disorder: Secondary | ICD-10-CM | POA: Diagnosis not present

## 2019-03-06 MED ORDER — ALPRAZOLAM 0.5 MG PO TABS: 0.5000 mg | ORAL_TABLET | Freq: Every evening | ORAL | 3 refills | Status: DC | PRN

## 2019-03-06 MED FILL — SIMVASTATIN 20 MG TABLET: 20 | 30 days supply | Qty: 30 | Fill #3

## 2019-03-06 MED FILL — ALPRAZolam 0.5 MG TABS: 0.5 | 60 days supply | Qty: 60 | Fill #0

## 2019-03-06 NOTE — Progress Notes (Signed)
   Subjective:    Patient ID: Pamela Ingram, female    DOB: Jan 19, 1951, 68 y.o.   MRN: [id number]  HPI: 68 year old Female with history of anxiety treated with Cymbalta seen today by [employer] due to the coronavirus pandemic.  Patient was last seen here for welcome to Medicare physical exam and evaluation of medical issues early February 2020.  Patient has been followed from the gastroenterology office due to the pandemic.  She has been getting along fairly well but has become increasingly anxious.  Has been prescribed Xanax previously for occasional nighttime insomnia due to anxiety.  She is adamant she does not want to get dependent on Xanax.  She generally has a cocktail before dinner to relax.  Has been getting some exercise.  Has not been sleeping all that well due to anxiety.  She is considering not going back to work at this time.  She is on DMARD for rheumatoid arthritis and that increases her risk of COVID complications due to its suppression of her immune system.       Review of Systems see above     Objective:   Physical Exam  I spoke with patient, reviewed her medical records and considered treatment  regarding anxiety issues.  Total of 15 minutes spent in this process.  She understands that anxiety during this time pandemic is not unusual and she was reassured that anxiety will likely improve as the pandemic hopefully resolves in the next few weeks.  She does not have any issues with claustrophobia just anxiety.      Assessment & Plan:  Anxiety state  Plan: We discussed increasing Xanax to twice daily.  She could breal 0.5 mg tablet in half and take that dose if she gets anxious during the day.  She will call if symptoms do not improve.

## 2019-03-06 NOTE — Telephone Encounter (Signed)
Pamela Ingram [phone]  [place] called in to say her anxiety is very high right now and she needs something to help.

## 2019-03-06 NOTE — Telephone Encounter (Signed)
Needs virtual visit 

## 2019-03-06 NOTE — Telephone Encounter (Signed)
Scheduled virtual visit °

## 2019-03-25 NOTE — Patient Instructions (Addendum)
It was a pleasure to speak with you today by virtual visit.  May take one half of a 0.5 mg Xanax tablet if feeling especially anxious during the day.  Call if symptoms persist.

## 2019-03-31 MED FILL — DULOXETINE HCL 60 MG CPEP: 60 | 30 days supply | Qty: 30 | Fill #4

## 2019-04-10 MED FILL — OTEZLA 30 MG TABS: 30 | 30 days supply | Qty: 60 | Fill #5

## 2019-04-11 MED FILL — SIMVASTATIN 20 MG TABLET: 20 | 30 days supply | Qty: 30 | Fill #4

## 2019-05-01 MED FILL — DULOXETINE HCL 60 MG CPEP: 60 | 30 days supply | Qty: 30 | Fill #5

## 2019-05-03 MED FILL — predniSONE 10 MG TABS: 10 | 6 days supply | Qty: 21 | Fill #0

## 2019-05-03 MED FILL — FLUTICASONE PROP 50 MCG SPR: 50 | 30 days supply | Qty: 16 | Fill #0

## 2019-05-09 MED FILL — OTEZLA 30 MG TABS: 30 | 30 days supply | Qty: 60 | Fill #0

## 2019-05-26 ENCOUNTER — Other Ambulatory Visit: Payer: Self-pay | Admitting: Internal Medicine

## 2019-05-26 MED FILL — LEVOTHYROXINE 75 MCG TABLET: 75 | 90 days supply | Qty: 90 | Fill #0

## 2019-05-26 MED FILL — SIMVASTATIN 20 MG TABLET: 20 | 30 days supply | Qty: 30 | Fill #5

## 2019-05-29 ENCOUNTER — Other Ambulatory Visit: Payer: Self-pay

## 2019-05-29 ENCOUNTER — Other Ambulatory Visit: Payer: Medicare Other | Admitting: Internal Medicine

## 2019-05-29 DIAGNOSIS — E039 Hypothyroidism, unspecified: Secondary | ICD-10-CM

## 2019-05-29 DIAGNOSIS — E7800 Pure hypercholesterolemia, unspecified: Secondary | ICD-10-CM

## 2019-05-30 LAB — HEPATIC FUNCTION PANEL
AG Ratio: 1.5 (calc) (ref 1.0–2.5)
ALT: 12 U/L (ref 6–29)
AST: 23 U/L (ref 10–35)
Albumin: 4.5 g/dL (ref 3.6–5.1)
Alkaline phosphatase (APISO): 64 U/L (ref 37–153)
Bilirubin, Direct: 0.1 mg/dL (ref 0.0–0.2)
Globulin: 3 g/dL (calc) (ref 1.9–3.7)
Indirect Bilirubin: 0.4 mg/dL (calc) (ref 0.2–1.2)
Total Bilirubin: 0.5 mg/dL (ref 0.2–1.2)
Total Protein: 7.5 g/dL (ref 6.1–8.1)

## 2019-05-30 LAB — TSH: TSH: 1.2 mIU/L (ref 0.40–4.50)

## 2019-05-30 LAB — LIPID PANEL
Cholesterol: 219 mg/dL — ABNORMAL HIGH (ref <200)
HDL: 69 mg/dL (ref >=50)
LDL Cholesterol (Calc): 130 mg/dL (calc) — ABNORMAL HIGH
Non-HDL Cholesterol (Calc): 150 mg/dL (calc) — ABNORMAL HIGH (ref <130)
Total CHOL/HDL Ratio: 3.2 (calc) (ref <5.0)
Triglycerides: 95 mg/dL (ref <150)

## 2019-05-30 MED FILL — DULOXETINE HCL 60 MG CPEP: 60 | 30 days supply | Qty: 30 | Fill #6

## 2019-06-05 ENCOUNTER — Ambulatory Visit: Payer: Medicare Other | Admitting: Internal Medicine

## 2019-06-05 ENCOUNTER — Encounter: Payer: Self-pay | Admitting: Internal Medicine

## 2019-06-05 ENCOUNTER — Other Ambulatory Visit: Payer: Self-pay

## 2019-06-05 VITALS — BP 100/70 | HR 71 | Temp 98.1°F | Ht 63.25 in | Wt 152.0 lb

## 2019-06-05 DIAGNOSIS — E7800 Pure hypercholesterolemia, unspecified: Secondary | ICD-10-CM

## 2019-06-05 DIAGNOSIS — M069 Rheumatoid arthritis, unspecified: Secondary | ICD-10-CM

## 2019-06-05 DIAGNOSIS — E039 Hypothyroidism, unspecified: Secondary | ICD-10-CM | POA: Diagnosis not present

## 2019-06-05 DIAGNOSIS — F411 Generalized anxiety disorder: Secondary | ICD-10-CM

## 2019-06-05 NOTE — Progress Notes (Signed)
   Subjective:    Patient ID: Pamela Ingram, female    DOB: June 14, 1951, 68 y.o.   MRN: [id number]  HPI 68 year old Female for 6 month recheck. Is on Actemra and Otezla per Rheumatologist for Rheumatoid arthritis.  History of hyperlipidemia and hypothyroidism.  History of anxiety and depression.  Remains on Zocor 20 mg daily.  Takes Cymbalta for pain control and depression.  Takes Xanax sparingly for anxiety.  TSH is WNL on current dose of thyroid replacement medication.  Review of Systems trying to get exercise at home with exercise viseo aerobics. Rheumatoid arthritis is stable on current regimen.      Objective:   Physical Exam  Weight stable at 152 pounds.  No inflamed joints.  Affect is normal.  Much less anxious than previous visit and 90.  Lipid panel shows increased total cholesterol at 219 and previously was 184 a year ago.  Triglycerides are 130 and previously were 103 a year ago.  We will leave her on the same dose of lipid-lowering medication.  HDL is 69 and triglycerides are 95.    Assessment & Plan:  Rheumatoid arthritis treated by rheumatologist and stable  Anxiety state-stable with as needed Xanax which she takes sparingly  History of depression-takes Cymbalta which also is helpful with pain  Hypothyroidism-stable with levothyroxine 0.075 mg daily  Hyperlipidemia-LDL has increased from 103 to 130. Total cholesterol has increased from 184 to 103.  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 RTC for CPE and labs in Feb 2021.

## 2019-06-05 NOTE — Patient Instructions (Signed)
Continue to work on diet and exercise. Continue same meds. RTC in 6 months for CPE and labs. It was a pleasure to see you today.

## 2019-06-09 ENCOUNTER — Encounter: Payer: Self-pay | Admitting: Internal Medicine

## 2019-06-09 ENCOUNTER — Ambulatory Visit: Payer: Medicare Other | Admitting: Internal Medicine

## 2019-06-09 ENCOUNTER — Other Ambulatory Visit: Payer: Self-pay

## 2019-06-09 ENCOUNTER — Telehealth: Payer: Self-pay

## 2019-06-09 VITALS — BP 140/80 | HR 88 | Temp 98.2°F | Ht 63.25 in | Wt 152.0 lb

## 2019-06-09 DIAGNOSIS — M25519 Pain in unspecified shoulder: Secondary | ICD-10-CM | POA: Diagnosis not present

## 2019-06-09 DIAGNOSIS — H6592 Unspecified nonsuppurative otitis media, left ear: Secondary | ICD-10-CM | POA: Diagnosis not present

## 2019-06-09 MED ORDER — AZITHROMYCIN 250 MG PO TABS: ORAL_TABLET | ORAL | 0 refills | Status: DC

## 2019-06-09 MED FILL — AZITHROMYCIN 250 MG TABLET: 250 | 5 days supply | Qty: 6 | Fill #0

## 2019-06-09 NOTE — Telephone Encounter (Signed)
Scheduled

## 2019-06-09 NOTE — Telephone Encounter (Signed)
Work in this afternoon.  

## 2019-06-09 NOTE — Telephone Encounter (Signed)
Patient called sates her shoulder started hurting yesterday is like a throbbing pain now the pain has moved to her upper chest she said she through this before she took ibuprofen 600mg  and it didn't really help, she is unsure wether she has a fever bc she has been taking ibuprofen every 8 hours. She wants to know if she needs to go to urgent care or if you can work her in today? She is not having chest pain or shortness of breath.

## 2019-06-19 MED FILL — OTEZLA 30 MG TABS: 30 | 30 days supply | Qty: 60 | Fill #1

## 2019-06-21 NOTE — Progress Notes (Signed)
   Subjective:    Patient ID: Pamela Ingram, female    DOB: 1951/05/02, 68 y.o.   MRN: [id number]  HPI Here with shoulder pain. Also with left ear discomfort. Hx rheumatoid arthritis on DMARD.  Denies injury to neck or shoulder. Is on Cymbalta which should help musculoskeletal pain somewhat.   Review of Systems No fever chills, sore throat or headache     Objective:   Physical Exam Has slight fullness Left TM. TM not red. Pharynx clear. Right TM is WNL.  Good ROM shoulder. Grip normal and normal muscle strength UE.     Assessment & Plan:  Left serous otitis media  Musculoskeletal pain shoulder  RA  Plan: Offered small quantity of narcotic pain med but pt declined.Zithromax Z pak. Take 2 po day 1 followed by one po days 2-5. Ice or heat to shoulder.

## 2019-06-21 NOTE — Patient Instructions (Signed)
Take Zithromax Z-PAK 2 p.o. day 1 followed by 1 p.o. days 2 through 5.  Ice or heat to shoulder.

## 2019-06-28 MED FILL — DULOXETINE HCL 60 MG CPEP: 60 | 30 days supply | Qty: 30 | Fill #7

## 2019-06-28 MED FILL — SIMVASTATIN 20 MG TABLET: 20 | 30 days supply | Qty: 30 | Fill #6

## 2019-07-14 ENCOUNTER — Other Ambulatory Visit (HOSPITAL_BASED_OUTPATIENT_CLINIC_OR_DEPARTMENT_OTHER): Payer: Self-pay | Admitting: Internal Medicine

## 2019-07-14 DIAGNOSIS — Z1231 Encounter for screening mammogram for malignant neoplasm of breast: Secondary | ICD-10-CM

## 2019-07-24 MED FILL — OTEZLA 30 MG TABS: 30 | 30 days supply | Qty: 60 | Fill #2

## 2019-07-24 MED FILL — hydrOXYzine HCL 10 MG TABS: 10 | 30 days supply | Qty: 60 | Fill #1

## 2019-07-27 MED FILL — DULOXETINE HCL 60 MG CPEP: 60 | 30 days supply | Qty: 30 | Fill #8

## 2019-07-27 MED FILL — ALPRAZolam 0.5 MG TABS: 0.5 | 60 days supply | Qty: 60 | Fill #1

## 2019-07-27 MED FILL — SIMVASTATIN 20 MG TABLET: 20 | 30 days supply | Qty: 30 | Fill #7

## 2019-08-15 ENCOUNTER — Encounter (HOSPITAL_BASED_OUTPATIENT_CLINIC_OR_DEPARTMENT_OTHER): Payer: Self-pay

## 2019-08-15 ENCOUNTER — Ambulatory Visit (HOSPITAL_BASED_OUTPATIENT_CLINIC_OR_DEPARTMENT_OTHER)
Admission: RE | Admit: 2019-08-15 | Discharge: 2019-08-15 | Disposition: A | Discharge status: Home / Self Care | Payer: Medicare Other | Source: Ambulatory Visit | Attending: Internal Medicine | Admitting: Internal Medicine

## 2019-08-15 ENCOUNTER — Other Ambulatory Visit: Payer: Self-pay

## 2019-08-15 DIAGNOSIS — Z1231 Encounter for screening mammogram for malignant neoplasm of breast: Secondary | ICD-10-CM | POA: Insufficient documentation

## 2019-08-16 MED FILL — OTEZLA 30 MG TABS: 30 | 30 days supply | Qty: 60 | Fill #3

## 2019-08-25 ENCOUNTER — Other Ambulatory Visit: Payer: Self-pay | Admitting: Internal Medicine

## 2019-08-25 MED FILL — LEVOTHYROXINE 75 MCG TABLET: 75 | 90 days supply | Qty: 90 | Fill #0

## 2019-08-25 MED FILL — DULOXETINE HCL 60 MG CPEP: 60 | 30 days supply | Qty: 30 | Fill #9

## 2019-08-29 MED FILL — OTEZLA 30 MG TABS: 30 | 30 days supply | Qty: 60 | Fill #3

## 2019-09-06 MED FILL — SIMVASTATIN 20 MG TABLET: 20 | 30 days supply | Qty: 30 | Fill #8

## 2019-09-25 ENCOUNTER — Other Ambulatory Visit: Payer: Self-pay

## 2019-09-25 DIAGNOSIS — F418 Other specified anxiety disorders: Secondary | ICD-10-CM

## 2019-09-25 MED ORDER — SIMVASTATIN 20 MG PO TABS: 20.0000 mg | ORAL_TABLET | Freq: Every evening | ORAL | 3 refills | Status: DC

## 2019-09-25 MED ORDER — DULOXETINE HCL 60 MG PO CPEP: 60.0000 mg | ORAL_CAPSULE | Freq: Every day | ORAL | 1 refills | Status: DC

## 2019-09-25 MED FILL — DULOXETINE HCL 60 MG CPEP: 60 | 90 days supply | Qty: 90 | Fill #0

## 2019-09-25 NOTE — Addendum Note (Signed)
Addended by: Mady Haagensen on: 09/25/2019 03:17 PM   Modules accepted: Orders

## 2019-09-26 MED FILL — OTEZLA 30 MG TABS: 30 | 30 days supply | Qty: 60 | Fill #4

## 2019-09-29 MED FILL — SIMVASTATIN 20 MG TABLET: 20 | 90 days supply | Qty: 90 | Fill #0

## 2019-10-23 ENCOUNTER — Telehealth: Payer: Self-pay | Admitting: Internal Medicine

## 2019-10-23 NOTE — Telephone Encounter (Signed)
She needs to get BP machine and Pulse ox device. Once this is done, we can arrange virtual visit. We need some data to go on

## 2019-10-23 NOTE — Telephone Encounter (Signed)
Called patient to let her know what Dr Renold Genta said, she is going to go to her office and have this done then will call back to schedule virtual visit.

## 2019-10-23 NOTE — Telephone Encounter (Signed)
Pamela Ingram called to say she went by her office Blood Pressure was 126/72, Oxygen 100%, pulse was 92, temperature was 98. She stated after going to her office to do this she feels like she is better and that the cough is from dry heat in house, she thinks she may just be anxious because of what is going on in the world. She will call back if it gets worse.

## 2019-10-23 NOTE — Telephone Encounter (Signed)
Pamela Ingram [phone]  Kadyn called to say she has a cough that started early December, she was tested then for COVID and it was negative, she feels like she is now having some Shortness of Breat along with cough, no fever or COVID exposure that she knows of. She does not have BP machine at home to take BP

## 2019-10-24 MED FILL — OTEZLA 30 MG TABS: 30 | 30 days supply | Qty: 60 | Fill #5

## 2019-10-26 ENCOUNTER — Other Ambulatory Visit: Payer: Self-pay

## 2019-10-26 ENCOUNTER — Emergency Department (HOSPITAL_BASED_OUTPATIENT_CLINIC_OR_DEPARTMENT_OTHER): Payer: Medicare Other

## 2019-10-26 ENCOUNTER — Emergency Department (HOSPITAL_BASED_OUTPATIENT_CLINIC_OR_DEPARTMENT_OTHER)
Admission: EM | Admit: 2019-10-26 | Discharge: 2019-10-26 | Disposition: A | Discharge status: Home / Self Care | Payer: Medicare Other | Attending: Emergency Medicine | Admitting: Emergency Medicine

## 2019-10-26 ENCOUNTER — Encounter (HOSPITAL_BASED_OUTPATIENT_CLINIC_OR_DEPARTMENT_OTHER): Payer: Self-pay | Admitting: Emergency Medicine

## 2019-10-26 DIAGNOSIS — M069 Rheumatoid arthritis, unspecified: Secondary | ICD-10-CM | POA: Diagnosis not present

## 2019-10-26 DIAGNOSIS — Z79899 Other long term (current) drug therapy: Secondary | ICD-10-CM | POA: Insufficient documentation

## 2019-10-26 DIAGNOSIS — E785 Hyperlipidemia, unspecified: Secondary | ICD-10-CM | POA: Insufficient documentation

## 2019-10-26 DIAGNOSIS — Z87891 Personal history of nicotine dependence: Secondary | ICD-10-CM | POA: Diagnosis not present

## 2019-10-26 DIAGNOSIS — Z880 Allergy status to penicillin: Secondary | ICD-10-CM | POA: Insufficient documentation

## 2019-10-26 DIAGNOSIS — R079 Chest pain, unspecified: Secondary | ICD-10-CM

## 2019-10-26 DIAGNOSIS — E039 Hypothyroidism, unspecified: Secondary | ICD-10-CM | POA: Insufficient documentation

## 2019-10-26 DIAGNOSIS — R0789 Other chest pain: Secondary | ICD-10-CM | POA: Insufficient documentation

## 2019-10-26 DIAGNOSIS — Z9861 Coronary angioplasty status: Secondary | ICD-10-CM | POA: Insufficient documentation

## 2019-10-26 LAB — CBC WITH DIFFERENTIAL/PLATELET
Abs Immature Granulocytes: 0.01 10*3/uL (ref 0.00–0.07)
Basophils Absolute: 0 10*3/uL (ref 0.0–0.1)
Basophils Relative: 0 %
Eosinophils Absolute: 0.3 10*3/uL (ref 0.0–0.5)
Eosinophils Relative: 4 %
HCT: 39.4 % (ref 36.0–46.0)
Hemoglobin: 12.9 g/dL (ref 12.0–15.0)
Immature Granulocytes: 0 %
Lymphocytes Relative: 28 %
Lymphs Abs: 1.9 10*3/uL (ref 0.7–4.0)
MCH: 30 pg (ref 26.0–34.0)
MCHC: 32.7 g/dL (ref 30.0–36.0)
MCV: 91.6 fL (ref 80.0–100.0)
Monocytes Absolute: 0.6 10*3/uL (ref 0.1–1.0)
Monocytes Relative: 9 %
Neutro Abs: 4.1 10*3/uL (ref 1.7–7.7)
Neutrophils Relative %: 59 %
Platelets: 242 10*3/uL (ref 150–400)
RBC: 4.3 MIL/uL (ref 3.87–5.11)
RDW: 12.4 % (ref 11.5–15.5)
WBC: 6.9 10*3/uL (ref 4.0–10.5)
nRBC: 0 % (ref 0.0–0.2)

## 2019-10-26 LAB — COMPREHENSIVE METABOLIC PANEL
ALT: 23 U/L (ref 0–44)
AST: 21 U/L (ref 15–41)
Albumin: 4.1 g/dL (ref 3.5–5.0)
Alkaline Phosphatase: 61 U/L (ref 38–126)
Anion gap: 5 (ref 5–15)
BUN: 15 mg/dL (ref 8–23)
CO2: 26 mmol/L (ref 22–32)
Calcium: 9.2 mg/dL (ref 8.9–10.3)
Chloride: 108 mmol/L (ref 98–111)
Creatinine, Ser: 0.7 mg/dL (ref 0.44–1.00)
GFR calc Af Amer: >60 mL/min (ref >60)
GFR calc non Af Amer: >60 mL/min (ref >60)
Glucose, Bld: 89 mg/dL (ref 70–99)
Potassium: 4 mmol/L (ref 3.5–5.1)
Sodium: 139 mmol/L (ref 135–145)
Total Bilirubin: 0.5 mg/dL (ref 0.3–1.2)
Total Protein: 7 g/dL (ref 6.5–8.1)

## 2019-10-26 LAB — TROPONIN I (HIGH SENSITIVITY): Troponin I (High Sensitivity): 3 ng/L (ref <18)

## 2019-10-26 MED ORDER — ASPIRIN 81 MG PO CHEW
324.0000 mg | CHEWABLE_TABLET | Freq: Once | ORAL | Status: AC
  Administered 2019-10-26: 324 mg via ORAL
  Filled 2019-10-26: qty 4

## 2019-10-26 NOTE — ED Triage Notes (Signed)
Patient reports left sided chest pain which radiates into her upper left back that began last night at 8pm.  Denies shortness of breath, dizziness.  Ambulatory to room without difficulty.  States "all month I haven't felt good and had a slight cough which happens in the morning".  Denies any recent sick contacts.  States that she had a negative covid test at the beginning of December.

## 2019-10-26 NOTE — ED Provider Notes (Addendum)
[facility] EMERGENCY DEPARTMENT Provider Note   CSN: [id number] Arrival date & time: 10/26/19  0900     History Chief Complaint  Patient presents with  . Chest Pain    Pamela Ingram is a 68 y.o. female.  HPI  68 year old female presents with left-sided chest and shoulder pain.  Started last night around 8 PM.  She noticed the pain starting in her left trapezius and seemed to go into her chest.  It was painful for her husband to rub.  The pain is not pleuritic and feels like a dull ache.  It is better than it was last night after he tried multiple things such as Epson salts, heat, ibuprofen.  She has had a "hacking cough" that is not productive and with minimal to no shortness of breath for the past 1 month.  No fevers.  She had a negative Covid test at the beginning of the month.  She denies any leg swelling.  There is no new shortness of breath with this pain.  Pain is currently better, rates it as a 4.  Besides when she fell asleep it has been continuous since last night and then awakening this morning. Reminds her of when she had pleurisy a few years ago, though no pleuritic pain this time.  Past Medical History:  Diagnosis Date  . Anxiety   . Hyperlipidemia   . Pleurisy with effusion   . Rheumatoid arthritis(714.0)   . Thyroid disease    hypothyroidism    Patient Active Problem List   Diagnosis Date Noted  . 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 Overweight (BMI 25.0-29.9) 03/13/2013  . Rheumatoid arthritis(714.0) 10/26/2008  . Hyperlipemia 10/27/2007  . Hypothyroid 10/27/2003  . Depression with anxiety 10/26/1993    Past Surgical History:  Procedure Laterality Date  . COLONOSCOPY    . LAPAROSCOPY    . LEFT HEART CATH AND CORONARY ANGIOGRAPHY N/A 06/04/2017   Procedure: Left Heart Cath and Coronary Angiography;  Surgeon: Tonny Bollmanooper, Michael, MD;  Location: [facility] INVASIVE CV LAB;  Service: Cardiovascular;  Laterality: N/A;  . TUBAL LIGATION    . VAGINAL  HYSTERECTOMY  1995     OB History   No obstetric history on file.     Family History  Problem Relation Age of Onset  . Emphysema Mother   . Rheum arthritis Mother   . Hypertension Father   . Stroke Father   . Diabetes Paternal Uncle   . Colon cancer Neg Hx   . Rectal cancer Neg Hx   . Stomach cancer Neg Hx   . Esophageal cancer Neg Hx   . Cancer Neg Hx     Social History   Tobacco Use  . Smoking status: Former Smoker    Packs/day: 0.50    Years: 44.00    Pack years: 22.00    Quit date: 10/26/2009    Years since quitting: 10.0  . Smokeless tobacco: Never Used  . Tobacco comment: off and on x 44 years 03/14/13  Substance Use Topics  . Alcohol use: Yes    Alcohol/week: 0.0 standard drinks    Comment: 1-2 drinks a week  . Drug use: No    Home Medications Prior to Admission medications   Medication Sig Start Date End Date Taking? Authorizing Provider  ALPRAZolam Prudy Feeler(XANAX) 0.5 MG tablet Take 1 tablet (0.5 mg total) by mouth at bedtime as needed for anxiety. 03/06/19   Margaree MackintoshBaxley, Mary J, MD  Apremilast (OTEZLA) 30 MG TABS Take  1 tablet by mouth twice daily as directed 08/23/18   Tresa Garter, MD  azithromycin (ZITHROMAX) 250 MG tablet Take 2 tablets on day one and take one tablet po on days 2-5 06/09/19   Elby Showers, MD  DULoxetine (CYMBALTA) 60 MG capsule Take 1 capsule (60 mg total) by mouth daily. 09/25/19   Elby Showers, MD  hydrOXYzine (ATARAX/VISTARIL) 10 MG tablet Take 1 tablet by mouth as needed. 09/13/18   [provider]  ibuprofen (ADVIL,MOTRIN) 200 MG tablet Take 800 mg by mouth every 8 (eight) hours as needed for moderate pain.     [provider]  levothyroxine (SYNTHROID) 75 MCG tablet TAKE 1 TABLET BY MOUTH ONCE DAILY 08/25/19   Elby Showers, MD  simvastatin (ZOCOR) 20 MG tablet Take 1 tablet (20 mg total) by mouth every evening. 09/25/19   Elby Showers, MD  Tocilizumab (ACTEMRA) 162 MG/0.9ML SOSY Inject 0.9 ml (1 syringe)  subcutaneously every week 06/13/18   Tresa Garter, MD    Allergies    Amoxicillin  Review of Systems   Review of Systems  Constitutional: Negative for fever.  Respiratory: Positive for cough and shortness of breath.   Cardiovascular: Positive for chest pain. Negative for leg swelling.  All other systems reviewed and are negative.   Physical Exam Updated Vital Signs BP (!) 148/76   Pulse 69   Temp 97.8 F (36.6 C) (Oral)   Resp 18   Ht 5\' 4"  (1.626 m)   Wt 70.3 kg   SpO2 99%   BMI 26.61 kg/m   Physical Exam Vitals and nursing note reviewed.  Constitutional:      General: She is not in acute distress.    Appearance: She is well-developed. She is not ill-appearing or diaphoretic.  HENT:     Head: Normocephalic and atraumatic.     Right Ear: External ear normal.     Left Ear: External ear normal.     Nose: Nose normal.  Eyes:     General:        Right eye: No discharge.        Left eye: No discharge.  Cardiovascular:     Rate and Rhythm: Normal rate and regular rhythm.     Heart sounds: Normal heart sounds.  Pulmonary:     Effort: Pulmonary effort is normal.     Breath sounds: Normal breath sounds.  Chest:     Chest wall: No tenderness.  Abdominal:     Palpations: Abdomen is soft.     Tenderness: There is no abdominal tenderness.  Musculoskeletal:     Cervical back: No tenderness.     Thoracic back: No tenderness.     Right lower leg: No edema.     Left lower leg: No edema.  Skin:    General: Skin is warm and dry.  Neurological:     Mental Status: She is alert.  Psychiatric:        Mood and Affect: Mood is not anxious.     ED Results / Procedures / Treatments   Labs (all labs ordered are listed, but only abnormal results are displayed) Labs Reviewed  CBC WITH DIFFERENTIAL/PLATELET  COMPREHENSIVE METABOLIC PANEL  TROPONIN I (HIGH SENSITIVITY)    EKG EKG Interpretation  Date/Time:  Thursday October 26 2019 09:10:50 EST Ventricular Rate:   81 PR Interval:    QRS Duration: 68 QT Interval:  374 QTC Calculation: 435 R Axis:   74 Text Interpretation:  Sinus rhythm Baseline wander in lead(s) II aVR no acute ST/T changes no significant change since 2015 Confirmed by Pricilla Loveless [id number]) on 10/26/2019 9:12:36 AM   Radiology DG Chest 2 View  Result Date: 10/26/2019 CLINICAL DATA:  Cough, chest pain EXAM: CHEST - 2 VIEW COMPARISON:  10/20/2017 FINDINGS: Heart and mediastinal contours are within normal limits. No focal opacities or effusions. No acute bony abnormality. IMPRESSION: No active cardiopulmonary disease. Electronically Signed   By: Charlett Nose M.D.   On: 10/26/2019 09:52    Procedures Procedures (including critical care time)  Medications Ordered in ED Medications  aspirin chewable tablet 324 mg (324 mg Oral Given 10/26/19 1017)    ED Course  I have reviewed the triage vital signs and the nursing notes.  Pertinent labs & imaging results that were available during my care of the patient were reviewed by me and considered in my medical decision making (see chart for details).    MDM Rules/Calculators/A&P HEAR Score: 3                    Patient's labs, ECG, chest x-ray are all benign.  She appears well.  She is feeling much better.  My suspicion is this is not cardiac.  Doubt ACS, PE, dissection.  Probably this is muscular back pain that then radiated to her chest.  Given pain started 12+ hours ago with negative troponin and unlikely story, I think she does not need a second.  I offered her repeat Covid testing given her lingering cough but she declines.  She had a negative heart catheterization 2 years ago.  Advised to follow-up with PCP but I think she stable for discharge home with return precautions. She did have some elevated BP on arrival but she was also anxious, and this has improved with time. Final Clinical Impression(s) / ED Diagnoses Final diagnoses:  Nonspecific chest pain    Rx / DC Orders ED  Discharge Orders    None       Pricilla Loveless, MD 10/26/19 1021    Pricilla Loveless, MD 10/26/19 1022

## 2019-10-26 NOTE — Discharge Instructions (Addendum)
If you develop recurrent, continued, or worsening chest pain, shortness of breath, fever, vomiting, abdominal or back pain, or any other new/concerning symptoms then return to the ER for evaluation.  

## 2019-10-26 NOTE — ED Notes (Signed)
ED Provider at bedside. 

## 2019-11-08 DIAGNOSIS — Z79899 Other long term (current) drug therapy: Secondary | ICD-10-CM | POA: Diagnosis not present

## 2019-11-16 DIAGNOSIS — M0579 Rheumatoid arthritis with rheumatoid factor of multiple sites without organ or systems involvement: Secondary | ICD-10-CM | POA: Diagnosis not present

## 2019-11-16 DIAGNOSIS — Z79899 Other long term (current) drug therapy: Secondary | ICD-10-CM | POA: Diagnosis not present

## 2019-11-17 MED FILL — OTEZLA 30 MG TABS: 30 | 30 days supply | Qty: 60 | Fill #6

## 2019-11-30 MED FILL — LEVOTHYROXINE SODIUM 75 MCG: 75 | 90 days supply | Qty: 90 | Fill #1

## 2019-12-05 ENCOUNTER — Other Ambulatory Visit: Payer: Self-pay

## 2019-12-05 ENCOUNTER — Other Ambulatory Visit: Payer: Medicare PPO | Admitting: Internal Medicine

## 2019-12-05 DIAGNOSIS — E039 Hypothyroidism, unspecified: Secondary | ICD-10-CM | POA: Diagnosis not present

## 2019-12-05 DIAGNOSIS — M069 Rheumatoid arthritis, unspecified: Secondary | ICD-10-CM | POA: Diagnosis not present

## 2019-12-05 DIAGNOSIS — E7800 Pure hypercholesterolemia, unspecified: Secondary | ICD-10-CM

## 2019-12-05 DIAGNOSIS — Z0000 Encounter for general adult medical examination without abnormal findings: Secondary | ICD-10-CM

## 2019-12-05 DIAGNOSIS — F411 Generalized anxiety disorder: Secondary | ICD-10-CM | POA: Diagnosis not present

## 2019-12-06 LAB — CBC WITH DIFFERENTIAL/PLATELET
Absolute Monocytes: 360 cells/uL (ref 200–950)
Basophils Absolute: 38 cells/uL (ref 0–200)
Basophils Relative: 0.8 %
Eosinophils Absolute: 202 cells/uL (ref 15–500)
Eosinophils Relative: 4.2 %
HCT: 39.3 % (ref 35.0–45.0)
Hemoglobin: 13.2 g/dL (ref 11.7–15.5)
Lymphs Abs: 1541 cells/uL (ref 850–3900)
MCH: 30 pg (ref 27.0–33.0)
MCHC: 33.6 g/dL (ref 32.0–36.0)
MCV: 89.3 fL (ref 80.0–100.0)
MPV: 10.1 fL (ref 7.5–12.5)
Monocytes Relative: 7.5 %
Neutro Abs: 2659 cells/uL (ref 1500–7800)
Neutrophils Relative %: 55.4 %
Platelets: 358 10*3/uL (ref 140–400)
RBC: 4.4 10*6/uL (ref 3.80–5.10)
RDW: 12.1 % (ref 11.0–15.0)
Total Lymphocyte: 32.1 %
WBC: 4.8 10*3/uL (ref 3.8–10.8)

## 2019-12-06 LAB — COMPLETE METABOLIC PANEL WITH GFR
AG Ratio: 1.5 (calc) (ref 1.0–2.5)
ALT: 12 U/L (ref 6–29)
AST: 22 U/L (ref 10–35)
Albumin: 4.1 g/dL (ref 3.6–5.1)
Alkaline phosphatase (APISO): 69 U/L (ref 37–153)
BUN: 15 mg/dL (ref 7–25)
CO2: 27 mmol/L (ref 20–32)
Calcium: 9.3 mg/dL (ref 8.6–10.4)
Chloride: 102 mmol/L (ref 98–110)
Creat: 0.78 mg/dL (ref 0.50–0.99)
GFR, Est African American: 91 mL/min/{1.73_m2} (ref >=60)
GFR, Est Non African American: 79 mL/min/{1.73_m2} (ref >=60)
Globulin: 2.8 g/dL (calc) (ref 1.9–3.7)
Glucose, Bld: 90 mg/dL (ref 65–99)
Potassium: 4.3 mmol/L (ref 3.5–5.3)
Sodium: 139 mmol/L (ref 135–146)
Total Bilirubin: 0.3 mg/dL (ref 0.2–1.2)
Total Protein: 6.9 g/dL (ref 6.1–8.1)

## 2019-12-06 LAB — LIPID PANEL
Cholesterol: 195 mg/dL (ref <200)
HDL: 69 mg/dL (ref >=50)
LDL Cholesterol (Calc): 110 mg/dL (calc) — ABNORMAL HIGH
Non-HDL Cholesterol (Calc): 126 mg/dL (calc) (ref <130)
Total CHOL/HDL Ratio: 2.8 (calc) (ref <5.0)
Triglycerides: 74 mg/dL (ref <150)

## 2019-12-06 LAB — TSH: TSH: 1.2 mIU/L (ref 0.40–4.50)

## 2019-12-07 ENCOUNTER — Other Ambulatory Visit: Payer: Self-pay

## 2019-12-07 ENCOUNTER — Ambulatory Visit (INDEPENDENT_AMBULATORY_CARE_PROVIDER_SITE_OTHER): Payer: Medicare PPO | Admitting: Internal Medicine

## 2019-12-07 ENCOUNTER — Encounter: Payer: Self-pay | Admitting: Internal Medicine

## 2019-12-07 VITALS — BP 130/80 | HR 68 | Temp 98.0°F | Ht 64.0 in | Wt 158.0 lb

## 2019-12-07 DIAGNOSIS — Z0000 Encounter for general adult medical examination without abnormal findings: Secondary | ICD-10-CM | POA: Diagnosis not present

## 2019-12-07 DIAGNOSIS — F411 Generalized anxiety disorder: Secondary | ICD-10-CM | POA: Diagnosis not present

## 2019-12-07 DIAGNOSIS — E7800 Pure hypercholesterolemia, unspecified: Secondary | ICD-10-CM | POA: Diagnosis not present

## 2019-12-07 DIAGNOSIS — L409 Psoriasis, unspecified: Secondary | ICD-10-CM

## 2019-12-07 DIAGNOSIS — M05741 Rheumatoid arthritis with rheumatoid factor of right hand without organ or systems involvement: Secondary | ICD-10-CM | POA: Diagnosis not present

## 2019-12-07 DIAGNOSIS — F419 Anxiety disorder, unspecified: Secondary | ICD-10-CM | POA: Diagnosis not present

## 2019-12-07 DIAGNOSIS — E039 Hypothyroidism, unspecified: Secondary | ICD-10-CM

## 2019-12-07 DIAGNOSIS — M05742 Rheumatoid arthritis with rheumatoid factor of left hand without organ or systems involvement: Secondary | ICD-10-CM

## 2019-12-07 LAB — POCT URINALYSIS DIPSTICK
Appearance: NEGATIVE
Bilirubin, UA: NEGATIVE
Blood, UA: NEGATIVE
Glucose, UA: NEGATIVE
Ketones, UA: NEGATIVE
Leukocytes, UA: NEGATIVE
Nitrite, UA: NEGATIVE
Odor: NEGATIVE
Protein, UA: NEGATIVE
Spec Grav, UA: 1.01 (ref 1.010–1.025)
Urobilinogen, UA: 0.2 E.U./dL
pH, UA: 6.5 (ref 5.0–8.0)

## 2019-12-07 MED ORDER — ALPRAZOLAM 0.5 MG PO TABS: 0.5000 mg | ORAL_TABLET | Freq: Two times a day (BID) | ORAL | 1 refills | Status: DC | PRN

## 2019-12-07 MED FILL — ALPRAZolam 0.5 MG TABS: 0.5 | 30 days supply | Qty: 60 | Fill #0

## 2019-12-07 NOTE — Progress Notes (Signed)
Subjective:    Patient ID: Pamela Ingram, female    DOB: 03-28-1951, 69 y.o.   MRN: [id number]  HPI 69 year old Female for health maintenance exam, Medicare wellness, and evaluation of medical issues.  Seen at [facility] December 28. Was having a cough during month of December.  Had left-sided chest pain and shoulder pain.  EKG and chest x-ray were normal.  It was felt that she did not have cardiac chest pain but musculoskeletal back pain.  Troponin was negative.  She has had a negative cardiac catheterization 2 years previously.  Has had Covid-19 vaccine and did fine.  History of rheumatoid arthritis treated at [facility] in [facility] by rheumatologist with Actemra.  In 2014 developed pleurisy with pleural effusion seen by one of our pulmonary treated with prednisone.  History of hypothyroidism, anxiety and hyperlipidemia.  Tubal ligation 1986, hysterectomy 1994, browlift 2016, 3 pregnancies and 1 miscarriage.  Does not smoke.  Social alcohol consumption.  She is married.  2 adult sons.  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  History of lichen sclerosis and atrophicus treated with Temovate  Patient had normal cardiac catheterization May 2018.  Family history: Sister died of breast cancer.  Father died of complications of sepsis.  He had a stroke.  Mother died of respiratory complications and had history of rheumatoid arthritis.  1 sister living.  No brothers.       Review of Systems  Cardiovascular: Negative.   Gastrointestinal: Negative.   Genitourinary: Negative.   Musculoskeletal: Positive for arthralgias.  Neurological: Negative.   Psychiatric/Behavioral:       Anxiety       Objective:   Physical Exam Vitals reviewed.  Constitutional:      General: She is not in acute distress.    Appearance: Normal appearance.  HENT:     Head: Normocephalic and atraumatic.     Right Ear: Tympanic membrane normal.     Left Ear:  Tympanic membrane normal.     Nose: Nose normal.  Eyes:     General: No scleral icterus.       Right eye: No discharge.        Left eye: No discharge.     Extraocular Movements: Extraocular movements intact.     Pupils: Pupils are equal, round, and reactive to light.  Neck:     Vascular: No carotid bruit.  Cardiovascular:     Rate and Rhythm: Normal rate and regular rhythm.     Pulses: Normal pulses.     Heart sounds: No murmur.  Pulmonary:     Effort: Pulmonary effort is normal. No respiratory distress.     Breath sounds: Normal breath sounds.  Abdominal:     General: There is no distension.     Palpations: Abdomen is soft.     Tenderness: There is no rebound.  Genitourinary:    Comments: Pap taken of vaginal cuff in 2019 and not repeated due to age.  Bimanual normal. Musculoskeletal:     Cervical back: Neck supple. No rigidity.     Right lower leg: No edema.     Left lower leg: No edema.  Lymphadenopathy:     Cervical: No cervical adenopathy.  Skin:    General: Skin is warm and dry.  Neurological:     General: No focal deficit present.     Mental Status: She is alert.     Cranial Nerves: No cranial nerve deficit.  Psychiatric:        Mood and Affect: Mood normal.        Behavior: Behavior normal.        Thought Content: Thought content normal.        Judgment: Judgment normal.           Assessment & Plan:  Rheumatoid arthritis treated with DMARD, positive rheumatoid factor, anti-CCP negative.  Discontinued Plaquenil in 2011.  Started on methotrexate in 2011.  Actemra started in 2015.  Stephan Minister for 2 months in 2014.  Only had 1 dose of Orencia which was discontinued due to lip swelling.  Anxiety treated with Cymbalta and Xanax  Hypothyroidism-TSH is normal on thyroid replacement 0.075 mg daily  Elevated LDL of 110 and previously was 136 months ago she is on simvastatin 20 mg daily  Takes Cymbalta for anxiety, mild depression and musculoskeletal  pain  History of psoriasis treated by dermatologist at [facility].  Is on Otezla for this.  Plan: Continue current medications and follow-up in 6 months.  Subjective:   Patient presents for Medicare Annual/Subsequent preventive examination.  Review Past Medical/Family/Social: See above   Risk Factors  Current exercise habits: Walks some Dietary issues discussed: Low-fat low carbohydrate  Cardiac risk factors: Hyperlipidemia  Depression Screen  (Note: if answer to either of the following is "Yes", a more complete depression screening is indicated)   Over the past two weeks, have you felt down, depressed or hopeless?  Yes I think because of the pandemic Over the past two weeks, have you felt little interest or pleasure in doing things?  Yes I think because of the pandemic Have you lost interest or pleasure in daily life? No Do you often feel hopeless? No Do you cry easily over simple problems? No   Activities of Daily Living  In your present state of health, do you have any difficulty performing the following activities?:   Driving? No  Managing money? No  Feeding yourself? No  Getting from bed to chair? No  Climbing a flight of stairs? No  Preparing food and eating?: No  Bathing or showering? No  Getting dressed: No  Getting to the toilet? No  Using the toilet:No  Moving around from place to place: No  In the past year have you fallen or had a near fall?:  Yes 1 tripped over an ottoman Are you sexually active?  Yes Do you have more than one partner? No   Hearing Difficulties: No  Do you often ask people to speak up or repeat themselves? No  Do you experience ringing or noises in your ears? No  Do you have difficulty understanding soft or whispered voices?  Sometimes Do you feel that you have a problem with memory? No Do you often misplace items? No    Home Safety:  Do you have a smoke alarm at your residence? Yes Do you have grab bars in the bathroom?  None Do you  have throw rugs in your house?  None   Cognitive Testing  Alert? Yes Normal Appearance?Yes  Oriented to person? Yes Place? Yes  Time? Yes  Recall of three objects? Yes  Can perform simple calculations? Yes  Displays appropriate judgment?Yes  Can read the correct time from a watch face?Yes   List the Names of Other Physician/Practitioners you currently use:  See referral list for the physicians patient is currently seeing.  Dermatologist at [facility] [place]: See above  Objective:     General appearance: Slightly anxious but well dressed and neat Head: Normocephalic, without obvious abnormality, atraumatic  Eyes: conj clear, EOMi PEERLA  Ears: normal TM's and external ear canals both ears  Nose: Nares normal. Septum midline. Mucosa normal. No drainage or sinus tenderness.  Throat: lips, mucosa, and tongue normal; teeth and gums normal  Neck: no adenopathy, no carotid bruit, no JVD, supple, symmetrical, trachea midline and thyroid not enlarged, symmetric, no tenderness/mass/nodules  No CVA tenderness.  Lungs: clear to auscultation bilaterally  Breasts: normal appearance, no masses or tenderness Heart: regular rate and rhythm, S1, S2 normal, no murmur, click, rub or gallop  Abdomen: soft, non-tender; bowel sounds normal; no masses, no organomegaly  Musculoskeletal: ROM normal in all joints, no crepitus, no deformity, Normal muscle strengthen. Back  is symmetric, no curvature. Skin: Skin color, texture, turgor normal. No rashes or lesions  Lymph nodes: Cervical, supraclavicular, and axillary nodes normal.  Neurologic: CN 2 -12 Normal, Normal symmetric reflexes. Normal coordination and gait  Psych: Alert & Oriented x 3, Mood appear stable.    Assessment:    Annual wellness medicare exam   Plan:    During the course of the visit the patient was educated and counseled about appropriate screening and preventive services  including:   Annual mammogram     Patient Instructions (the written plan) was given to the patient.  Medicare Attestation  I have personally reviewed:  The patient's medical and social history  Their use of alcohol, tobacco or illicit drugs  Their current medications and supplements  The patient's functional ability including ADLs,fall risks, home safety risks, cognitive, and hearing and visual impairment  Diet and physical activities  Evidence for depression or mood disorders  The patient's weight, height, BMI, and visual acuity have been recorded in the chart. I have made referrals, counseling, and provided education to the patient based on review of the above and I have provided the patient with a written personalized care plan for preventive services.

## 2019-12-24 NOTE — Patient Instructions (Addendum)
It was a pleasure to see you today.  Continue thyroid replacement medication, Cymbalta and Xanax.  Follow-up here in 6 months.

## 2019-12-28 MED FILL — DULoxetine HCL 60 MG CPEP: 60 | 90 days supply | Qty: 90 | Fill #1

## 2020-01-01 DIAGNOSIS — M0579 Rheumatoid arthritis with rheumatoid factor of multiple sites without organ or systems involvement: Secondary | ICD-10-CM | POA: Diagnosis not present

## 2020-01-01 DIAGNOSIS — L409 Psoriasis, unspecified: Secondary | ICD-10-CM | POA: Diagnosis not present

## 2020-01-01 DIAGNOSIS — Z79899 Other long term (current) drug therapy: Secondary | ICD-10-CM | POA: Diagnosis not present

## 2020-01-02 MED FILL — OTEZLA 30 MG TABS: 30 | 30 days supply | Qty: 60 | Fill #0

## 2020-01-10 ENCOUNTER — Other Ambulatory Visit: Payer: Self-pay

## 2020-01-10 ENCOUNTER — Telehealth: Payer: Self-pay | Admitting: Internal Medicine

## 2020-01-10 ENCOUNTER — Encounter: Payer: Self-pay | Admitting: Internal Medicine

## 2020-01-10 ENCOUNTER — Ambulatory Visit: Payer: Medicare PPO | Admitting: Internal Medicine

## 2020-01-10 VITALS — BP 120/70 | HR 94 | Temp 98.4°F | Ht 64.0 in | Wt 162.0 lb

## 2020-01-10 DIAGNOSIS — R0602 Shortness of breath: Secondary | ICD-10-CM

## 2020-01-10 MED ORDER — BUPROPION HCL ER (XL) 150 MG PO TB24: 150.0000 mg | ORAL_TABLET | Freq: Every day | ORAL | 0 refills | Status: DC

## 2020-01-10 MED FILL — buPROPion HCL ER (XL) 150 M: 150 | 30 days supply | Qty: 30 | Fill #0

## 2020-01-10 NOTE — Telephone Encounter (Signed)
Pamela Ingram [phone]  [name] called to say for the last couple of days she has been having shortness of breath, she called her rheumatologist and they wanted her to go to her PCP to listen to her lungs. She feels like it is her anxiety, no cough, no fever, no covid exposure and she has had both covid shots.

## 2020-01-10 NOTE — Telephone Encounter (Signed)
Appointment scheduled.

## 2020-01-10 NOTE — Telephone Encounter (Signed)
Patient called complaining about metallic taste in mouth. Pt seen in ED in December with chest pain. Today has had air hunger, crying and pin point area of chest pain. She will be seen today

## 2020-01-10 NOTE — Progress Notes (Signed)
   Subjective:    Patient ID: Pamela Ingram, female    DOB: 1951/10/14, 69 y.o.   MRN: [id number]  HPI 69 year old Female with history of arthritis treated by rheumatologist with [facility] in today complaining of shortness of breath.  She has no chest pain.  No diaphoresis.  No nausea or vomiting.  Says she has had situational stress with her son who currently is separated from his wife.  There are 2 grandchildren, 1 of whom is adopted that she is concerned about.  She retired from [employer].  Having issues with anxiety.  She is on Cymbalta 60 mg daily which should help with pain control of her rheumatoid arthritis and also help any depression she may be having.  Admits to watching too much TV and becoming upset with what sound to me today.  She has history of hypothyroidism and is on levothyroxine.  She is on statin medication.  We had a long discussion today about situational stress and the events that she is having to deal with in her family.  Her pulse oximetry is normal at 98%.    Review of Systems she called earlier today saying the rheumatologist wanted someone to see her and do a chest exam.     Objective:   Physical Exam Pulse ox is 98% BP 120/70, temperature 98.4 degrees pulse 94, weight 162 pounds Neck is supple without JVD thyromegaly or carotid bruits.  Chest is clear to auscultation.  Cardiac exam regular rate and rhythm normal S1 and S2.  Extremities without edema.  Affect is anxious but not depressed.      Assessment & Plan:  Anxiety state-take Xanax 0.5 mg twice daily as needed  Situational stress-consider counseling  Depression treated with Cymbalta-add Wellbutrin XL 150 mg which may help her energy level and reevaluate in 4 to 6 weeks.  Shortness of breath-?  Anxiety versus occult lung disease.  Will refer to pulmonology for evaluation and possible PFTs.?  Rule out interstitial lung disease.  Have chest x-ray in the near future.  Spent 35 minutes with patient including  counseling regarding situational stress.

## 2020-01-12 NOTE — Patient Instructions (Signed)
Add Wellbutrin to Cymbalta.  Follow-up in 4 weeks.  Take Xanax as needed for anxiety.  Consider counseling.  Have chest x-ray in the near future.  Order placed.  Pulmonary consultation to be arranged due to shortness of breath on DMARD with history of rheumatoid arthritis-rule out rheumatoid lung/interstitial lung disease

## 2020-01-18 DIAGNOSIS — F411 Generalized anxiety disorder: Secondary | ICD-10-CM | POA: Diagnosis not present

## 2020-01-23 MED FILL — SIMVASTATIN 20 MG TABLET: 20 | 90 days supply | Qty: 90 | Fill #1

## 2020-02-01 DIAGNOSIS — F411 Generalized anxiety disorder: Secondary | ICD-10-CM | POA: Diagnosis not present

## 2020-02-02 MED FILL — OTEZLA 30 MG TABS: 30 | 30 days supply | Qty: 60 | Fill #1

## 2020-02-09 ENCOUNTER — Other Ambulatory Visit: Payer: Self-pay | Admitting: Internal Medicine

## 2020-02-09 ENCOUNTER — Encounter: Payer: Self-pay | Admitting: Internal Medicine

## 2020-02-09 ENCOUNTER — Other Ambulatory Visit: Payer: Self-pay

## 2020-02-09 ENCOUNTER — Ambulatory Visit: Payer: Medicare PPO | Admitting: Internal Medicine

## 2020-02-09 VITALS — BP 120/70 | HR 78 | Temp 98.4°F | Ht 64.0 in | Wt 162.0 lb

## 2020-02-09 DIAGNOSIS — F439 Reaction to severe stress, unspecified: Secondary | ICD-10-CM | POA: Diagnosis not present

## 2020-02-09 DIAGNOSIS — F411 Generalized anxiety disorder: Secondary | ICD-10-CM

## 2020-02-09 MED ORDER — BUPROPION HCL ER (XL) 300 MG PO TB24: 300.0000 mg | ORAL_TABLET | Freq: Every day | ORAL | 0 refills | Status: DC

## 2020-02-09 MED FILL — BUPROPION HCL XL 300 MG TAB: 300 | 90 days supply | Qty: 90 | Fill #0

## 2020-02-09 NOTE — Patient Instructions (Signed)
It was a pleasure to see you today.  We are pleased you are feeling better.  Continue counseling.  Increase Wellbutrin XL to 300 mg daily and follow-up in 2 months.  Continue Cymbalta.

## 2020-02-09 NOTE — Telephone Encounter (Signed)
I sent it just now

## 2020-02-09 NOTE — Progress Notes (Signed)
   Subjective:    Patient ID: Pamela Ingram, female    DOB: Mar 04, 1951, 69 y.o.   MRN: [id number]  HPI 69 year old Female seen in follow-up on anxiety and situational stress.  She has seen Dr. Ollen Gross, psychologist for counseling and found to be very helpful.  She is currently on Cymbalta 60 mg daily and at last visit I added Wellbutrin XL 150 mg daily.  She is feeling less stressed.  Currently not binge eating.  Watching her diet and trying not to eat after supper.  Still has some pressured speech when speaking about her son and grandson having continued situational stress.  I have asked her to try to let some of this go and not let it worry her as much.  The problem is that some of the situations that have arisen remind her of her of problems in her first marriage.    Review of Systems no new complaints     Objective:   Physical Exam  Spent 15 minutes speaking with her about these issues.  Her affect is much less stressed.  I think Wellbutrin is helping her and I would like to increase it to 300 mg XL daily.  She will continue counseling.      Assessment & Plan:  Situational stress  Anxiety depression  Plan: Increase Wellbutrin XL to 300 mg daily and continue Cymbalta 60 mg daily.  Return in 2 months.  Continue to see Dr. Andrey Campanile for counseling.

## 2020-02-09 NOTE — Telephone Encounter (Signed)
Please call them. I just sent in 300 mg XL

## 2020-02-09 NOTE — Telephone Encounter (Signed)
It wasn't signed it didn't go through.

## 2020-02-12 DIAGNOSIS — M0579 Rheumatoid arthritis with rheumatoid factor of multiple sites without organ or systems involvement: Secondary | ICD-10-CM | POA: Diagnosis not present

## 2020-02-12 DIAGNOSIS — Z79899 Other long term (current) drug therapy: Secondary | ICD-10-CM | POA: Diagnosis not present

## 2020-02-13 ENCOUNTER — Institutional Professional Consult (permissible substitution): Payer: Self-pay | Admitting: Internal Medicine

## 2020-02-15 ENCOUNTER — Encounter: Payer: Self-pay | Admitting: Pulmonary Disease

## 2020-02-15 ENCOUNTER — Institutional Professional Consult (permissible substitution): Payer: Self-pay | Admitting: Internal Medicine

## 2020-02-15 ENCOUNTER — Other Ambulatory Visit: Payer: Self-pay

## 2020-02-15 ENCOUNTER — Ambulatory Visit: Payer: Medicare PPO | Admitting: Pulmonary Disease

## 2020-02-15 VITALS — BP 132/64 | HR 91 | Temp 98.0°F | Ht 64.0 in | Wt 162.4 lb

## 2020-02-15 DIAGNOSIS — R0602 Shortness of breath: Secondary | ICD-10-CM | POA: Diagnosis not present

## 2020-02-15 NOTE — Progress Notes (Signed)
Pamela Ingram    [id number]    [birth year]  Primary Care Physician:Baxley, Cresenciano Lick, MD  Referring Physician: Elby Showers, MD [address] [place],  [place] [address]  Chief complaint:   Patient with shortness of breath on significant exertion and sometimes at rest  HPI:  Shortness of breath happens intermittently She does get short of breath with significant exertion when she is trying to exercise She may also occasionally get short of breath just sitting around  She does have a history of anxiety for which she is on Wellbutrin and Xanax Concerned about dependency on Xanax Denies any significant negative symptoms when she uses Xanax  History of rheumatoid arthritis for which she is on Actemra Previous history of pleurisy  Reformed smoker quit many years ago Never diagnosed with any lung disease Had PFT in 2014 that was within normal limits  Was in the ED in December with a bronchitis, chest x-ray at the time was normal  No history of asthma growing up No family history of lung disease  Outpatient Encounter Medications as of 02/15/2020  Medication Sig  . ALPRAZolam (XANAX) 0.5 MG tablet Take 1 tablet (0.5 mg total) by mouth 2 (two) times daily as needed for anxiety.  [name] Apremilast (OTEZLA) 30 MG TABS Take 1 tablet by mouth twice daily as directed  . buPROPion (WELLBUTRIN XL) 300 MG 24 hr tablet Take 1 tablet (300 mg total) by mouth daily.  . DULoxetine (CYMBALTA) 60 MG capsule Take 1 capsule (60 mg total) by mouth daily.  . hydrOXYzine (ATARAX/VISTARIL) 10 MG tablet Take 1 tablet by mouth as needed.  [name] ibuprofen (ADVIL,MOTRIN) 200 MG tablet Take 800 mg by mouth every 8 (eight) hours as needed for moderate pain.   [name] levothyroxine (SYNTHROID) 75 MCG tablet TAKE 1 TABLET BY MOUTH ONCE DAILY  . simvastatin (ZOCOR) 20 MG tablet Take 1 tablet (20 mg total) by mouth every evening.  . Tocilizumab (ACTEMRA) 162 MG/0.9ML SOSY Inject 0.9 ml (1 syringe) subcutaneously  every week   No facility-administered encounter medications on file as of 02/15/2020.    Allergies as of 02/15/2020 - Review Complete 02/15/2020  Allergen Reaction Noted  . Amoxicillin Other (See Comments) 04/05/2012    Past Medical History:  Diagnosis Date  . Anxiety   . Hyperlipidemia   . Pleurisy with effusion   . Rheumatoid arthritis(714.0)   . Thyroid disease    hypothyroidism    Past Surgical History:  Procedure Laterality Date  . COLONOSCOPY    . LAPAROSCOPY    . LEFT HEART CATH AND CORONARY ANGIOGRAPHY N/A 06/04/2017   Procedure: Left Heart Cath and Coronary Angiography;  Surgeon: Sherren Mocha, MD;  Location: [facility];  Service: Cardiovascular;  Laterality: N/A;  . TUBAL LIGATION    . VAGINAL HYSTERECTOMY  1995    Family History  Problem Relation Age of Onset  . Emphysema Mother   . Rheum arthritis Mother   . Hypertension Father   . Stroke Father   . Diabetes Paternal Uncle   . Colon cancer Neg Hx   . Rectal cancer Neg Hx   . Stomach cancer Neg Hx   . Esophageal cancer Neg Hx   . Cancer Neg Hx     Social History   Socioeconomic History  . Marital status: Married    Spouse name: Not on file  . Number of children: 2  . Years of education: Not on file  .  Highest education level: Not on file  Occupational History  . Occupation: NURSE    Employer: [employer]  Tobacco Use  . Smoking status: Former Smoker    Packs/day: 0.50    Years: 44.00    Pack years: 22.00    Quit date: 10/26/2009    Years since quitting: 10.3  . Smokeless tobacco: Never Used  . Tobacco comment: off and on x 44 years 03/14/13  Substance and Sexual Activity  . Alcohol use: Yes    Alcohol/week: 0.0 standard drinks    Comment: 1-2 drinks a week  . Drug use: No  . Sexual activity: Yes    Birth control/protection: Surgical  Other Topics Concern  . Not on file  Social History Narrative   Married - RN [employer]   2 sons 2 daughters   EtOH 1-2 drinks/week    Former smoker, no tobacco, substance now   3 caffeine/day      Social Determinants of Corporate investment banker Strain:   . Difficulty of Paying Living Expenses:   Food Insecurity:   . Worried About Programme researcher, broadcasting/film/video in the Last Year:   . Barista in the Last Year:   Transportation Needs:   . Freight forwarder (Medical):   [name] Lack of Transportation (Non-Medical):   Physical Activity:   . Days of Exercise per Week:   . Minutes of Exercise per Session:   Stress:   . Feeling of Stress :   Social Connections:   . Frequency of Communication with Friends and Family:   . Frequency of Social Gatherings with Friends and Family:   . Attends Religious Services:   . Active Member of Clubs or Organizations:   . Attends Banker Meetings:   [name] Marital Status:   Intimate Partner Violence:   . Fear of Current or Ex-Partner:   . Emotionally Abused:   [name] Physically Abused:   . Sexually Abused:     Review of Systems  Constitutional: Negative.   HENT: Negative.   Eyes: Negative.   Respiratory: Positive for shortness of breath. Negative for cough.   Cardiovascular: Negative.   Endocrine: Negative.     Vitals:   02/15/20 0904  BP: 132/64  Pulse: 91  Temp: 98 F (36.7 C)  SpO2: 95%     Physical Exam  Constitutional: She is oriented to person, place, and time. She appears well-developed and well-nourished.  HENT:  Head: Normocephalic and atraumatic.  Eyes: Pupils are equal, round, and reactive to light. Conjunctivae are normal. Right eye exhibits no discharge.  Neck: No tracheal deviation present. No thyromegaly present.  Cardiovascular: Normal rate and regular rhythm.  Pulmonary/Chest: Effort normal and breath sounds normal. No respiratory distress. She has no wheezes. She has no rales. She exhibits no tenderness.  Musculoskeletal:     Cervical back: Normal range of motion and neck supple.  Neurological: She is alert and oriented to person, place, and  time.  Skin: Skin is warm and dry. No erythema.  Psychiatric: She has a normal mood and affect.   Data Reviewed: PFT from 2014 was within normal limits with no obstruction, no restriction Chest x-ray from October 26, 2019-no infiltrative process  Assessment:  Shortness of breath -Likely related to anxiety -No diagnosed underlying lung disease -Not feeling sick recently -No associated cough or chest pain or discomfort  Anxiety -May be suboptimally controlled at present  Plan/Recommendations: Encouraged to use Xanax as needed -Does not usually  get any significant side effects from using Xanax -Still able to function well with use -Does help her get good nights rest as well  We will obtain a pulmonary function test to compare with previous  Chest x-ray if any new symptoms  I will see her in 3 months  Encouraged to call with any significant concerns  Encouraged to continue graded exercises   Virl Diamond MD [facility] 02/15/2020, 9:12 AM  CC: Margaree Mackintosh, MD

## 2020-02-15 NOTE — Patient Instructions (Signed)
Shortness of breath  Your recent chest x-ray as we looked at was within normal limits Previous breathing study a few years back was within normal limits  We will repeat breathing study  Continue graded exercises  Control of anxiety symptoms as best as you can  We will follow-up with you in 3 months

## 2020-02-16 NOTE — Telephone Encounter (Signed)
This was done a week ago and it's in my box I can't get rid of it can you please refuse? Thank you.

## 2020-02-23 MED FILL — CLINDAMYCIN HCL 300 MG CAP: 300 | 7 days supply | Qty: 21 | Fill #0

## 2020-02-23 MED FILL — FLUCONAZOLE 150 MG TABS: 150 | 2 days supply | Qty: 2 | Fill #0

## 2020-02-27 ENCOUNTER — Other Ambulatory Visit: Payer: Self-pay | Admitting: Internal Medicine

## 2020-02-27 MED FILL — LEVOTHYROXINE SODIUM 75 MCG: 75 | 90 days supply | Qty: 90 | Fill #0

## 2020-02-28 MED FILL — CHLORHEXIDINE 0.12% RINSE: 0.12 | 14 days supply | Qty: 473 | Fill #0

## 2020-02-29 DIAGNOSIS — F411 Generalized anxiety disorder: Secondary | ICD-10-CM | POA: Diagnosis not present

## 2020-03-19 DIAGNOSIS — F411 Generalized anxiety disorder: Secondary | ICD-10-CM | POA: Diagnosis not present

## 2020-04-02 ENCOUNTER — Other Ambulatory Visit: Payer: Self-pay | Admitting: Internal Medicine

## 2020-04-02 DIAGNOSIS — F418 Other specified anxiety disorders: Secondary | ICD-10-CM

## 2020-04-11 ENCOUNTER — Encounter: Payer: Self-pay | Admitting: Internal Medicine

## 2020-04-11 ENCOUNTER — Other Ambulatory Visit: Payer: Self-pay

## 2020-04-11 ENCOUNTER — Ambulatory Visit: Payer: Medicare PPO | Admitting: Internal Medicine

## 2020-04-11 VITALS — BP 110/80 | HR 72 | Ht 64.0 in | Wt 158.0 lb

## 2020-04-11 DIAGNOSIS — F411 Generalized anxiety disorder: Secondary | ICD-10-CM | POA: Diagnosis not present

## 2020-04-11 DIAGNOSIS — F439 Reaction to severe stress, unspecified: Secondary | ICD-10-CM

## 2020-04-11 NOTE — Progress Notes (Signed)
   Subjective:    Patient ID: Ginette Pitman, female    DOB: 1951/01/16, 69 y.o.   MRN: [id number]  HPI  69 year old Female for follow up. Weaned herself off Wellbutrin because it was making her feel bad. Still seeing Ollen Gross for counseling every  3-4  Weeks.This has been invaluable to her.  Saw Pulmonologist in April for SOB who noted her pulmonary functions from 2014 were normal.  He thought shortness of breath was related to anxiety and not underlying lung disease.  He encouraged her to use Xanax The recommended pulmonary functions but apparently none were scheduled.  He said he would see her again in 3 months.  Patient says her symptoms are better and she will not do this now.  With regard to situational stress with her son she feels things have improved.  Continues to be followed for rheumatoid arthritis by rheumatologist.  Continue Xanax for anxiety.  Discontinued Wellbutrin.  Remains on Cymbalta.   Review of Systems no new complaints.      Objective:   Physical Exam Weight 158 pounds. BP 130/80 pulse ox 98% BMI 27.12 Neck supple.  No thyromegaly or adenopathy.  Chest clear.  Cardiac exam regular rate and rhythm.  Affect thought and judgment appear to be normal.      Assessment & Plan:  Anxiety state- improved with counseling. Wellbutrin discontinued.  Continue Xanax.  Continue Cymbalta.  Shortness of breath-improved.  Does not want pulmonary function studies at this point in time.  Hyperlipidemia-continue simvastatin.  Hypothyroidism continue thyroid replacement medication.  Rheumatoid arthritis continue DMARD  Plan: Book CPE for Feb 2022. Call sooner if having concerns. May continue Cymbalta and Xanax.

## 2020-04-11 NOTE — Patient Instructions (Signed)
Continue SSRI med and Xanax. Continue counseling. It was a pleasure to see you today. RTC Feb 2022 for CPE and Medicare wellness visit.

## 2020-04-25 MED FILL — SIMVASTATIN 20 MG TABLET: 20 | 90 days supply | Qty: 90 | Fill #2

## 2020-05-03 MED FILL — OTEZLA 30 MG TABS: 30 | 30 days supply | Qty: 60 | Fill #4

## 2020-05-03 MED FILL — ALPRAZolam 0.5 MG TABS: 0.5 | 30 days supply | Qty: 60 | Fill #1

## 2020-05-16 DIAGNOSIS — F411 Generalized anxiety disorder: Secondary | ICD-10-CM | POA: Diagnosis not present

## 2020-05-20 ENCOUNTER — Encounter: Payer: Self-pay | Admitting: Internal Medicine

## 2020-05-20 DIAGNOSIS — H35013 Changes in retinal vascular appearance, bilateral: Secondary | ICD-10-CM | POA: Diagnosis not present

## 2020-05-20 DIAGNOSIS — H35373 Puckering of macula, bilateral: Secondary | ICD-10-CM | POA: Diagnosis not present

## 2020-05-20 DIAGNOSIS — H35363 Drusen (degenerative) of macula, bilateral: Secondary | ICD-10-CM | POA: Diagnosis not present

## 2020-05-20 DIAGNOSIS — H2513 Age-related nuclear cataract, bilateral: Secondary | ICD-10-CM | POA: Diagnosis not present

## 2020-05-28 DIAGNOSIS — M0579 Rheumatoid arthritis with rheumatoid factor of multiple sites without organ or systems involvement: Secondary | ICD-10-CM | POA: Diagnosis not present

## 2020-05-28 DIAGNOSIS — Z79899 Other long term (current) drug therapy: Secondary | ICD-10-CM | POA: Diagnosis not present

## 2020-05-29 MED FILL — LEVOTHYROXINE SODIUM 75 MCG: 75 | 90 days supply | Qty: 90 | Fill #1

## 2020-06-04 MED FILL — OTEZLA 30 MG TABS: 30 | 30 days supply | Qty: 60 | Fill #5

## 2020-06-10 DIAGNOSIS — M0579 Rheumatoid arthritis with rheumatoid factor of multiple sites without organ or systems involvement: Secondary | ICD-10-CM | POA: Diagnosis not present

## 2020-06-10 DIAGNOSIS — L409 Psoriasis, unspecified: Secondary | ICD-10-CM | POA: Diagnosis not present

## 2020-06-10 DIAGNOSIS — Z111 Encounter for screening for respiratory tuberculosis: Secondary | ICD-10-CM | POA: Diagnosis not present

## 2020-06-10 DIAGNOSIS — Z79899 Other long term (current) drug therapy: Secondary | ICD-10-CM | POA: Diagnosis not present

## 2020-06-22 ENCOUNTER — Ambulatory Visit: Payer: Medicare PPO | Attending: Internal Medicine

## 2020-06-22 DIAGNOSIS — Z23 Encounter for immunization: Secondary | ICD-10-CM

## 2020-06-22 NOTE — Progress Notes (Signed)
   Covid-19 Vaccination Clinic  Name:  KANAI HILGER    MRN: [id number] DOB: 29-Mar-1951  06/22/2020  Ms. Shedlock was observed post Covid-19 immunization for 15 minutes without incident. She was provided with Vaccine Information Sheet and instruction to access the V-Safe system.   Ms. Friedland was instructed to call 911 with any severe reactions post vaccine: [name] Difficulty breathing  . Swelling of face and throat  . A fast heartbeat  . A bad rash all over body  . Dizziness and weakness

## 2020-06-25 DIAGNOSIS — F411 Generalized anxiety disorder: Secondary | ICD-10-CM | POA: Diagnosis not present

## 2020-07-04 MED FILL — DULOXETINE HCL 60 MG CPEP: 60 | 90 days supply | Qty: 90 | Fill #1

## 2020-07-09 MED FILL — OTEZLA 30 MG TABS: 30 | 30 days supply | Qty: 60 | Fill #6

## 2020-07-11 MED FILL — FLUAD QUADRIVALENT 0.5 ML P: 0.5 | 1 days supply | Qty: 1 | Fill #0

## 2020-07-22 ENCOUNTER — Other Ambulatory Visit (HOSPITAL_BASED_OUTPATIENT_CLINIC_OR_DEPARTMENT_OTHER): Payer: Self-pay | Admitting: Internal Medicine

## 2020-07-22 DIAGNOSIS — Z1231 Encounter for screening mammogram for malignant neoplasm of breast: Secondary | ICD-10-CM

## 2020-07-23 DIAGNOSIS — F411 Generalized anxiety disorder: Secondary | ICD-10-CM | POA: Diagnosis not present

## 2020-07-31 MED FILL — SIMVASTATIN 20 MG TABLET: 20 | 90 days supply | Qty: 90 | Fill #3

## 2020-08-09 MED FILL — OTEZLA 30 MG TABS: 30 | 30 days supply | Qty: 60 | Fill #7

## 2020-08-19 ENCOUNTER — Ambulatory Visit (HOSPITAL_BASED_OUTPATIENT_CLINIC_OR_DEPARTMENT_OTHER): Payer: Medicare PPO

## 2020-08-26 ENCOUNTER — Ambulatory Visit (HOSPITAL_BASED_OUTPATIENT_CLINIC_OR_DEPARTMENT_OTHER)
Admission: RE | Admit: 2020-08-26 | Discharge: 2020-08-26 | Disposition: A | Discharge status: Home / Self Care | Payer: Medicare PPO | Source: Ambulatory Visit | Attending: Internal Medicine | Admitting: Internal Medicine

## 2020-08-26 ENCOUNTER — Encounter (HOSPITAL_BASED_OUTPATIENT_CLINIC_OR_DEPARTMENT_OTHER): Payer: Self-pay

## 2020-08-26 ENCOUNTER — Other Ambulatory Visit: Payer: Self-pay

## 2020-08-26 DIAGNOSIS — Z1231 Encounter for screening mammogram for malignant neoplasm of breast: Secondary | ICD-10-CM | POA: Insufficient documentation

## 2020-08-28 ENCOUNTER — Other Ambulatory Visit: Payer: Self-pay | Admitting: Internal Medicine

## 2020-08-29 ENCOUNTER — Other Ambulatory Visit: Payer: Self-pay | Admitting: Internal Medicine

## 2020-08-29 MED FILL — LEVOTHYROXINE SODIUM 75 MCG: 75 | 90 days supply | Qty: 90 | Fill #0

## 2020-09-02 DIAGNOSIS — L409 Psoriasis, unspecified: Secondary | ICD-10-CM | POA: Diagnosis not present

## 2020-09-02 DIAGNOSIS — M069 Rheumatoid arthritis, unspecified: Secondary | ICD-10-CM | POA: Diagnosis not present

## 2020-09-02 DIAGNOSIS — R5383 Other fatigue: Secondary | ICD-10-CM | POA: Diagnosis not present

## 2020-09-11 ENCOUNTER — Other Ambulatory Visit (HOSPITAL_BASED_OUTPATIENT_CLINIC_OR_DEPARTMENT_OTHER): Payer: Self-pay | Admitting: Dermatology

## 2020-09-11 MED FILL — OTEZLA 30 MG TABS: 30 | 30 days supply | Qty: 60 | Fill #0

## 2020-09-30 ENCOUNTER — Telehealth: Payer: Self-pay | Admitting: Internal Medicine

## 2020-09-30 NOTE — Telephone Encounter (Signed)
Pt said her anxiety is really bad right now and she wanted to know if Dr Lenord Fellers would refill some xanax for her until her February appt

## 2020-09-30 NOTE — Telephone Encounter (Signed)
She needs an office visit 

## 2020-09-30 NOTE — Telephone Encounter (Signed)
Scheduled

## 2020-10-03 ENCOUNTER — Other Ambulatory Visit: Payer: Self-pay | Admitting: Internal Medicine

## 2020-10-03 DIAGNOSIS — F418 Other specified anxiety disorders: Secondary | ICD-10-CM

## 2020-10-03 MED FILL — DULOXETINE HCL 60 MG CPEP: 60 | 90 days supply | Qty: 90 | Fill #0

## 2020-10-07 ENCOUNTER — Other Ambulatory Visit: Payer: Self-pay | Admitting: Internal Medicine

## 2020-10-07 ENCOUNTER — Other Ambulatory Visit: Payer: Self-pay

## 2020-10-07 ENCOUNTER — Ambulatory Visit: Payer: Medicare PPO | Admitting: Internal Medicine

## 2020-10-07 VITALS — BP 120/80 | HR 78 | Ht 64.0 in | Wt 160.0 lb

## 2020-10-07 DIAGNOSIS — T783XXA Angioneurotic edema, initial encounter: Secondary | ICD-10-CM | POA: Insufficient documentation

## 2020-10-07 DIAGNOSIS — R21 Rash and other nonspecific skin eruption: Secondary | ICD-10-CM | POA: Insufficient documentation

## 2020-10-07 DIAGNOSIS — F411 Generalized anxiety disorder: Secondary | ICD-10-CM | POA: Diagnosis not present

## 2020-10-07 DIAGNOSIS — F439 Reaction to severe stress, unspecified: Secondary | ICD-10-CM | POA: Diagnosis not present

## 2020-10-07 DIAGNOSIS — M0580 Other rheumatoid arthritis with rheumatoid factor of unspecified site: Secondary | ICD-10-CM | POA: Insufficient documentation

## 2020-10-07 DIAGNOSIS — F4321 Adjustment disorder with depressed mood: Secondary | ICD-10-CM | POA: Diagnosis not present

## 2020-10-07 MED ORDER — ALPRAZOLAM 0.5 MG PO TABS: 0.5000 mg | ORAL_TABLET | Freq: Two times a day (BID) | ORAL | 1 refills | Status: DC | PRN

## 2020-10-07 MED FILL — ALPRAZolam 0.5 MG TABS: 0.5 | 30 days supply | Qty: 60 | Fill #0

## 2020-10-07 NOTE — Progress Notes (Signed)
   Subjective:    Patient ID: Pamela Ingram, female    DOB: [birth year], 69 y.o.   MRN: [id number]  HPI 69 year old Female seen today regarding anxiety and situational stress which was discussed at length. Son and daughter-in-law are separated. They have children, 1 of whom is adopted. This situation is upsetting to patient. She discussed this at length with me today. Clearly she is distraught about this. She called on December 6 saying her anxiety was extremely bad right now.  We discussed counseling. She will consider it.  She formally took Cymbalta but currently is not taking it. It might be helpful to get back on it. This helps with pain control and also depression.  She has rheumatoid arthritis, hyperlipidemia, hypothyroidism.  Recommended that she take Xanax 0.5 mg twice daily for anxiety state. I think the holidays are stressful for her.  She is on levothyroxine for hypothyroidism. Health maintenance exam is scheduled for February 2022.  Review of Systems see above     Objective:   Physical Exam Blood pressure 120/80 pulse 78 pulse oximetry 99% weight 160 pounds  She is moderately anxious in discussing situation with her son and daughter-in-law. Seems to have a good handle on the situation. Suggested counseling.     Assessment & Plan:  Anxiety state-patient will take Xanax 0.5 mg twice daily as needed for anxiety.  Were going to restart Cymbalta 60 mg daily for depression and chronic pain.  Plan: She will follow-up here in February 2022.  25 minutes spent with patient discussing situation at length regarding her son and daughter-in-law.

## 2020-10-14 MED FILL — OTEZLA 30 MG TABS: 30 | 30 days supply | Qty: 60 | Fill #1

## 2020-10-28 ENCOUNTER — Other Ambulatory Visit (HOSPITAL_BASED_OUTPATIENT_CLINIC_OR_DEPARTMENT_OTHER): Payer: Self-pay | Admitting: Dermatology

## 2020-10-28 DIAGNOSIS — L409 Psoriasis, unspecified: Secondary | ICD-10-CM | POA: Diagnosis not present

## 2020-10-28 DIAGNOSIS — L738 Other specified follicular disorders: Secondary | ICD-10-CM | POA: Diagnosis not present

## 2020-10-28 DIAGNOSIS — M21612 Bunion of left foot: Secondary | ICD-10-CM | POA: Diagnosis not present

## 2020-10-28 DIAGNOSIS — L400 Psoriasis vulgaris: Secondary | ICD-10-CM | POA: Diagnosis not present

## 2020-10-28 DIAGNOSIS — L720 Epidermal cyst: Secondary | ICD-10-CM | POA: Diagnosis not present

## 2020-10-28 MED FILL — CLOBETASOL PROPIONATE 0.05: 0.05 | 30 days supply | Qty: 100 | Fill #0

## 2020-10-28 MED FILL — hydrOXYzine HCL 10 MG TABS: 10 | 30 days supply | Qty: 60 | Fill #0

## 2020-11-05 ENCOUNTER — Other Ambulatory Visit: Payer: Self-pay | Admitting: Internal Medicine

## 2020-11-05 MED FILL — SIMVASTATIN 20 MG TABLET: 20 | 90 days supply | Qty: 90 | Fill #0

## 2020-11-06 MED FILL — OTEZLA 30 MG TABS: 30 | 30 days supply | Qty: 60 | Fill #0

## 2020-11-17 ENCOUNTER — Encounter: Payer: Self-pay | Admitting: Internal Medicine

## 2020-11-17 NOTE — Patient Instructions (Addendum)
Consider counseling for situational stress.  Continue levothyroxine for hypothyroidism.  Follow-up in February 2022 for health maintenance exam.  Restart Cymbalta 60 mg daily for anxiety depression.  Take Xanax 0.5 mg up to twice daily as needed for anxiety .

## 2020-11-27 MED FILL — LEVOTHYROXINE SODIUM 75 MCG: 75 | 90 days supply | Qty: 90 | Fill #1

## 2020-12-04 DIAGNOSIS — L4050 Arthropathic psoriasis, unspecified: Secondary | ICD-10-CM | POA: Diagnosis not present

## 2020-12-04 DIAGNOSIS — Z79899 Other long term (current) drug therapy: Secondary | ICD-10-CM | POA: Diagnosis not present

## 2020-12-04 DIAGNOSIS — L409 Psoriasis, unspecified: Secondary | ICD-10-CM | POA: Diagnosis not present

## 2020-12-05 ENCOUNTER — Other Ambulatory Visit: Payer: Medicare PPO | Admitting: Internal Medicine

## 2020-12-05 ENCOUNTER — Other Ambulatory Visit: Payer: Self-pay

## 2020-12-05 DIAGNOSIS — L409 Psoriasis, unspecified: Secondary | ICD-10-CM

## 2020-12-05 DIAGNOSIS — E7800 Pure hypercholesterolemia, unspecified: Secondary | ICD-10-CM | POA: Diagnosis not present

## 2020-12-05 DIAGNOSIS — F419 Anxiety disorder, unspecified: Secondary | ICD-10-CM | POA: Diagnosis not present

## 2020-12-05 DIAGNOSIS — F411 Generalized anxiety disorder: Secondary | ICD-10-CM | POA: Diagnosis not present

## 2020-12-05 DIAGNOSIS — M05741 Rheumatoid arthritis with rheumatoid factor of right hand without organ or systems involvement: Secondary | ICD-10-CM | POA: Diagnosis not present

## 2020-12-05 DIAGNOSIS — F439 Reaction to severe stress, unspecified: Secondary | ICD-10-CM | POA: Diagnosis not present

## 2020-12-05 DIAGNOSIS — F4321 Adjustment disorder with depressed mood: Secondary | ICD-10-CM | POA: Diagnosis not present

## 2020-12-05 DIAGNOSIS — E039 Hypothyroidism, unspecified: Secondary | ICD-10-CM | POA: Diagnosis not present

## 2020-12-05 DIAGNOSIS — Z0000 Encounter for general adult medical examination without abnormal findings: Secondary | ICD-10-CM

## 2020-12-06 DIAGNOSIS — Z79899 Other long term (current) drug therapy: Secondary | ICD-10-CM | POA: Diagnosis not present

## 2020-12-06 LAB — CBC WITH DIFFERENTIAL/PLATELET
Absolute Monocytes: 531 cells/uL (ref 200–950)
Basophils Absolute: 41 cells/uL (ref 0–200)
Basophils Relative: 0.6 %
Eosinophils Absolute: 331 cells/uL (ref 15–500)
Eosinophils Relative: 4.8 %
HCT: 41.8 % (ref 35.0–45.0)
Hemoglobin: 13.8 g/dL (ref 11.7–15.5)
Lymphs Abs: 1732 cells/uL (ref 850–3900)
MCH: 30.1 pg (ref 27.0–33.0)
MCHC: 33 g/dL (ref 32.0–36.0)
MCV: 91.1 fL (ref 80.0–100.0)
MPV: 10.9 fL (ref 7.5–12.5)
Monocytes Relative: 7.7 %
Neutro Abs: 4264 cells/uL (ref 1500–7800)
Neutrophils Relative %: 61.8 %
Platelets: 268 10*3/uL (ref 140–400)
RBC: 4.59 10*6/uL (ref 3.80–5.10)
RDW: 11.9 % (ref 11.0–15.0)
Total Lymphocyte: 25.1 %
WBC: 6.9 10*3/uL (ref 3.8–10.8)

## 2020-12-06 LAB — COMPLETE METABOLIC PANEL WITH GFR
AG Ratio: 1.7 (calc) (ref 1.0–2.5)
ALT: 13 U/L (ref 6–29)
AST: 17 U/L (ref 10–35)
Albumin: 4.3 g/dL (ref 3.6–5.1)
Alkaline phosphatase (APISO): 66 U/L (ref 37–153)
BUN: 13 mg/dL (ref 7–25)
CO2: 31 mmol/L (ref 20–32)
Calcium: 9.5 mg/dL (ref 8.6–10.4)
Chloride: 104 mmol/L (ref 98–110)
Creat: 0.77 mg/dL (ref 0.50–0.99)
GFR, Est African American: 92 mL/min/{1.73_m2} (ref >=60)
GFR, Est Non African American: 79 mL/min/{1.73_m2} (ref >=60)
Globulin: 2.5 g/dL (calc) (ref 1.9–3.7)
Glucose, Bld: 97 mg/dL (ref 65–99)
Potassium: 4.4 mmol/L (ref 3.5–5.3)
Sodium: 142 mmol/L (ref 135–146)
Total Bilirubin: 0.4 mg/dL (ref 0.2–1.2)
Total Protein: 6.8 g/dL (ref 6.1–8.1)

## 2020-12-06 LAB — LIPID PANEL
Cholesterol: 184 mg/dL (ref <200)
HDL: 77 mg/dL (ref >=50)
LDL Cholesterol (Calc): 93 mg/dL (calc)
Non-HDL Cholesterol (Calc): 107 mg/dL (calc) (ref <130)
Total CHOL/HDL Ratio: 2.4 (calc) (ref <5.0)
Triglycerides: 57 mg/dL (ref <150)

## 2020-12-06 LAB — TSH: TSH: 1 mIU/L (ref 0.40–4.50)

## 2020-12-09 ENCOUNTER — Other Ambulatory Visit: Payer: Self-pay

## 2020-12-09 ENCOUNTER — Encounter: Payer: Self-pay | Admitting: Internal Medicine

## 2020-12-09 ENCOUNTER — Ambulatory Visit (INDEPENDENT_AMBULATORY_CARE_PROVIDER_SITE_OTHER): Payer: Medicare PPO | Admitting: Internal Medicine

## 2020-12-09 VITALS — BP 120/70 | HR 90 | Ht 64.0 in | Wt 157.0 lb

## 2020-12-09 DIAGNOSIS — F4321 Adjustment disorder with depressed mood: Secondary | ICD-10-CM | POA: Diagnosis not present

## 2020-12-09 DIAGNOSIS — F411 Generalized anxiety disorder: Secondary | ICD-10-CM

## 2020-12-09 DIAGNOSIS — M05741 Rheumatoid arthritis with rheumatoid factor of right hand without organ or systems involvement: Secondary | ICD-10-CM | POA: Diagnosis not present

## 2020-12-09 DIAGNOSIS — E7800 Pure hypercholesterolemia, unspecified: Secondary | ICD-10-CM

## 2020-12-09 DIAGNOSIS — M05742 Rheumatoid arthritis with rheumatoid factor of left hand without organ or systems involvement: Secondary | ICD-10-CM | POA: Diagnosis not present

## 2020-12-09 DIAGNOSIS — E039 Hypothyroidism, unspecified: Secondary | ICD-10-CM

## 2020-12-09 DIAGNOSIS — Z0000 Encounter for general adult medical examination without abnormal findings: Secondary | ICD-10-CM | POA: Diagnosis not present

## 2020-12-09 DIAGNOSIS — L409 Psoriasis, unspecified: Secondary | ICD-10-CM

## 2020-12-09 LAB — POCT URINALYSIS DIPSTICK
Appearance: NEGATIVE
Bilirubin, UA: NEGATIVE
Blood, UA: NEGATIVE
Glucose, UA: NEGATIVE
Ketones, UA: NEGATIVE
Leukocytes, UA: NEGATIVE
Nitrite, UA: NEGATIVE
Odor: NEGATIVE
Protein, UA: NEGATIVE
Spec Grav, UA: 1.01 (ref 1.010–1.025)
Urobilinogen, UA: 0.2 E.U./dL
pH, UA: 6.5 (ref 5.0–8.0)

## 2020-12-09 NOTE — Progress Notes (Signed)
Subjective:    Patient ID: Pamela Ingram, female    DOB: 02-09-1951, 70 y.o.   MRN: [id number]  HPI 70 year old Female seen for Medicare wellness, health maintenance exam and evaluation of medical issues.  Last evening she tells me that EMS was called to her home.  She felt nauseated and had left facial and left arm numbness.  She was having some issues with her husband.  She did not go to the hospital.  Today says she feels okay and has no neurological symptoms at this point in time.  She has anxiety surrounding family issues and issues with her husband.  I have recommended counseling and bed consult for her with Ellis Savage.  These issues seem to be to be a recurring theme when I see her in the office.  I think she could benefit from some counseling and perhaps change in her medications.  History of rheumatoid arthritis treated at [facility] by rheumatologist with Actemra.  In 2014 developed pleurisy with pleural effusion seen by [facility] treated with prednisone.  History of hypothyroidism, anxiety and hyperlipidemia.  Tubal ligation 1986, hysterectomy 1994, browlift 2016, 3 pregnancies and 1 miscarriage.  Does not smoke.  Social alcohol consumption.  She is married.  2 adult sons.  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  Currently retired.  Patient had normal cardiac catheterization in May 2018.  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  Father died of complications of sepsis.  He had a stroke.  Mother died of respiratory complications and had history of rheumatoid arthritis.  1 sister living.  No brothers.    Review of Systems see above-continued family issues needs counseling to address     Objective:   Physical Exam Blood pressure 120/70 pulse 87 pulse oximetry 98%.  Standing blood pressure is 110/80 pulse 97 pulse oximetry 97% BMI 26.95  It would  appear she has mild orthostasis today and that could explain her dizziness.  Skin is warm and dry.  No cervical adenopathy.  Neck is supple.  No carotid bruits.  Chest clear to auscultation.  Cardiac exam regular rate and rhythm normal S1 and S2.  Abdomen is soft nondistended without hepatosplenomegaly masses or tenderness.  No lower extremity pitting edema.  Neuro is intact without focal deficits.       Assessment & Plan:  Orthostasis-likely due to volume depletion  Situational stress-have recommended counseling.  Has family issues she needs to discuss and try to resolve.  These worry her a great deal and she needs some advice on how to handle these issues when they come off.  Hypothyroidism-continue current thyroid replacement medication.  Stable TSH.  Anxiety treated with Xanax 0.5 mg twice daily as needed  Hyperlipidemia treated with Zocor and stable lipid panel  Rheumatoid arthritis treated with DMARD  History of psoriasis treated by dermatologist at [facility] with [facility]  Depression treated with Cymbalta 60 mg daily  Plan: Follow-up in 6 months.  Subjective:   Patient presents for Medicare Annual/Subsequent preventive examination.  Review Past Medical/Family/Social: See above   Risk Factors  Current exercise habits: Light exercise Dietary issues discussed: Low-fat low carbohydrate discussed  Cardiac risk factors: Hyperlipidemia  Depression Screen  (Note: if answer to either of the following is "Yes", a more complete depression screening is indicated)   Over the past two weeks, have you felt down, depressed or hopeless?  Yes-cat  passed away recently Over the past two weeks, have you felt little interest or pleasure in doing things? No Have you lost interest or pleasure in daily life? No Do you often feel hopeless? No Do you cry easily over simple problems? No   Activities of Daily Living  In your present state of health, do you have any difficulty performing the  following activities?:   Driving? No  Managing money? No  Feeding yourself? No  Getting from bed to chair? No  Climbing a flight of stairs? No  Preparing food and eating?: No  Bathing or showering? No  Getting dressed: No  Getting to the toilet? No  Using the toilet:No  Moving around from place to place: No  In the past year have you fallen or had a near fall?:No  Are you sexually active? yes Do you have more than one partner? No   Hearing Difficulties: No  Do you often ask people to speak up or repeat themselves? No  Do you experience ringing or noises in your ears? No  Do you have difficulty understanding soft or whispered voices? No  Do you feel that you have a problem with memory? No Do you often misplace items? No    Home Safety:  Do you have a smoke alarm at your residence? Yes Do you have grab bars in the bathroom?  No Do you have throw rugs in your house?  None   Cognitive Testing  Alert? Yes Normal Appearance?Yes  Oriented to person? Yes Place? Yes  Time? Yes  Recall of three objects? Yes  Can perform simple calculations? Yes  Displays appropriate judgment?Yes  Can read the correct time from a watch face?Yes   List the Names of Other Physician/Practitioners you currently use:  See referral list for the physicians patient is currently seeing.     Review of Systems: See above   Objective:     General appearance: Appears stated age Head: Normocephalic, without obvious abnormality, atraumatic  Eyes: conj clear, EOMi PEERLA  Ears: normal TM's and external ear canals both ears  Nose: Nares normal. Septum midline. Mucosa normal. No drainage or sinus tenderness.  Throat: lips, mucosa, and tongue normal; teeth and gums normal  Neck: no adenopathy, no carotid bruit, no JVD, supple, symmetrical, trachea midline and thyroid not enlarged, symmetric, no tenderness/mass/nodules  No CVA tenderness.  Lungs: clear to auscultation bilaterally  Breasts: normal  appearance, no masses or tenderness Heart: regular rate and rhythm, S1, S2 normal, no murmur, click, rub or gallop  Abdomen: soft, non-tender; bowel sounds normal; no masses, no organomegaly  Musculoskeletal: ROM normal in all joints, no crepitus, no deformity, Normal muscle strengthen. Back  is symmetric, no curvature. Skin: Skin color, texture, turgor normal. No rashes or lesions  Lymph nodes: Cervical, supraclavicular, and axillary nodes normal.  Neurologic: CN 2 -12 Normal, Normal symmetric reflexes. Normal coordination and gait  Psych: Alert & Oriented x 3, Mood appear stable.    Assessment:    Annual wellness medicare exam   Plan:    During the course of the visit the patient was educated and counseled about appropriate screening and preventive services including:    Has had 3 COVID immunizations    Patient Instructions (the written plan) was given to the patient.  Medicare Attestation  I have personally reviewed:  The patient's medical and social history  Their use of alcohol, tobacco or illicit drugs  Their current medications and supplements  The patient's functional ability including ADLs,fall  risks, home safety risks, cognitive, and hearing and visual impairment  Diet and physical activities  Evidence for depression or mood disorders  The patient's weight, height, BMI, and visual acuity have been recorded in the chart. I have made referrals, counseling, and provided education to the patient based on review of the above and I have provided the patient with a written personalized care plan for preventive services.

## 2020-12-10 ENCOUNTER — Telehealth: Payer: Self-pay | Admitting: Internal Medicine

## 2020-12-10 NOTE — Telephone Encounter (Signed)
Pamela Ingram [phone]  Pamela Ingram called to get an appointment to see Pamela Ingram and appantly to see her you now need a referral. [phone]  I called over there and left a message before lunch, to see exactly what they need. I have not heard from them yet.

## 2020-12-10 NOTE — Telephone Encounter (Signed)
Pamela Ingram office called back to say they need reason why patient is being referred, and last office notes faxed to [phone] and that patient is being referred to [name].

## 2020-12-13 ENCOUNTER — Other Ambulatory Visit (HOSPITAL_BASED_OUTPATIENT_CLINIC_OR_DEPARTMENT_OTHER): Payer: Self-pay

## 2020-12-13 MED FILL — predniSONE 5 MG TABS: 5 | 6 days supply | Qty: 21 | Fill #0

## 2020-12-13 NOTE — Telephone Encounter (Signed)
Faxed referral over to [employer] office

## 2020-12-16 ENCOUNTER — Telehealth: Payer: Self-pay | Admitting: Internal Medicine

## 2020-12-16 NOTE — Patient Instructions (Addendum)
Recommended counseling for situational stress as well as a med consult.  Continue current medications and follow-up in 6 months.  We are sorry to learn to treat cat passed away.

## 2020-12-16 NOTE — Telephone Encounter (Signed)
She is cancelling her appt with Ellis Savage as apparently a $150 fee up front was requested. This is not unusual for Psych services. We will not be addressing her anxiety issues in the future since she is declining to see therapist. MJB

## 2020-12-17 NOTE — Telephone Encounter (Signed)
Patient called on 2/21/2022to say she does not want to go and see Christianne Borrow, they want $150 fee up front, she does not want to pay. She feels she is fine at this time and her family can help her. She appreciates the referral and everything we do for her but will hold off.

## 2020-12-17 NOTE — Telephone Encounter (Signed)
Received fax from Christianne Borrow office that when they reached out to patient she does not want to schedule anything at this time, they will hold referral for 30 days and then will discard. Patient would need new referral if she decides to come at that time.

## 2020-12-18 ENCOUNTER — Other Ambulatory Visit (HOSPITAL_BASED_OUTPATIENT_CLINIC_OR_DEPARTMENT_OTHER): Payer: Self-pay

## 2020-12-18 MED FILL — predniSONE 10 MG TABS: 10 | 21 days supply | Qty: 42 | Fill #0

## 2020-12-20 ENCOUNTER — Other Ambulatory Visit (HOSPITAL_BASED_OUTPATIENT_CLINIC_OR_DEPARTMENT_OTHER): Payer: Self-pay

## 2020-12-20 MED FILL — OTEZLA 30 MG TABS: 30 | 30 days supply | Qty: 60 | Fill #1

## 2020-12-23 ENCOUNTER — Telehealth: Payer: Self-pay | Admitting: Internal Medicine

## 2020-12-23 NOTE — Telephone Encounter (Signed)
scheduled

## 2020-12-23 NOTE — Telephone Encounter (Signed)
She will need an appointment

## 2020-12-23 NOTE — Telephone Encounter (Signed)
Pamela Ingram [phone]  [name] called to say a couple weeks ago she called her Rheumatologist at [facility] and she had pleurisy, and they gave her a steroid dose pack and now they would like for her to follow up with her PCP to make sure her lungs are clear and to get a chest xray.

## 2020-12-24 ENCOUNTER — Encounter: Payer: Self-pay | Admitting: Internal Medicine

## 2020-12-24 ENCOUNTER — Ambulatory Visit: Payer: Medicare PPO | Admitting: Internal Medicine

## 2020-12-24 ENCOUNTER — Other Ambulatory Visit: Payer: Self-pay

## 2020-12-24 ENCOUNTER — Ambulatory Visit (HOSPITAL_BASED_OUTPATIENT_CLINIC_OR_DEPARTMENT_OTHER)
Admission: RE | Admit: 2020-12-24 | Discharge: 2020-12-24 | Disposition: A | Discharge status: Home / Self Care | Payer: Medicare PPO | Source: Ambulatory Visit | Attending: Internal Medicine | Admitting: Internal Medicine

## 2020-12-24 VITALS — BP 110/80 | HR 95 | Temp 98.5°F | Ht 64.0 in | Wt 158.0 lb

## 2020-12-24 DIAGNOSIS — R0781 Pleurodynia: Secondary | ICD-10-CM | POA: Insufficient documentation

## 2020-12-24 DIAGNOSIS — R079 Chest pain, unspecified: Secondary | ICD-10-CM | POA: Diagnosis not present

## 2020-12-24 DIAGNOSIS — R0789 Other chest pain: Secondary | ICD-10-CM

## 2020-12-24 NOTE — Progress Notes (Signed)
   Subjective:    Patient ID: Pamela Ingram, female    DOB: 04/07/1951, 70 y.o.   MRN: [id number]  HPI 70 year old Female  with rheumatoid arthritis seen with recent pleuritic pain.  Hurts with breathing.  Apparently was seen by Rheumatologist at [facility] in [place] recently, was told she had pleurisy.  Was given steroid Dosepak and advised her to follow-up here.  We have ordered a chest x-ray today.  She is on [place] for psoriasis and Actemra for rheumatoid arthritis.  Recent report from Rheumatologist in [place] indicates that she is doing well with regard to rheumatoid arthritis.   Review of Systems see above-no fever, chills, or cough. Feeling well at the present time    Objective:   Physical Exam  Vital signs are reviewed and are within normal limits.  She is afebrile.  Blood pressure is 110/80  Neck is supple.  Chest is clear to auscultation.  Cardiac exam regular rate and rhythm.      Assessment & Plan:  I feel that this is likely chest wall pain rather than pleurisy.  It responded to steroid Dosepak and she is doing better.  However I would like for her to have a chest x-ray.  She is agreeable to this.  Patient reports that situational stress has improved.  Plan: Return in 6 months.  Chest x-ray done on March 1 is within normal limits.  The lungs are clear.  Skeletal structures are unremarkable.

## 2020-12-25 NOTE — Telephone Encounter (Signed)
Received fax that patient has changed her mind and now has an appointment on 01/17/2021 to see Ellis Savage, APMHNP-BC

## 2021-01-01 MED FILL — DULOXETINE HCL 60 MG CPEP: 60 | 90 days supply | Qty: 90 | Fill #1

## 2021-01-17 ENCOUNTER — Other Ambulatory Visit (HOSPITAL_BASED_OUTPATIENT_CLINIC_OR_DEPARTMENT_OTHER): Payer: Self-pay | Admitting: *Deleted

## 2021-01-17 DIAGNOSIS — F411 Generalized anxiety disorder: Secondary | ICD-10-CM | POA: Diagnosis not present

## 2021-01-17 MED FILL — SERTRALINE HCL 50 MG TABS: 50 | 30 days supply | Qty: 30 | Fill #0

## 2021-01-23 NOTE — Patient Instructions (Signed)
This is likely chest wall pain rather than pleurisy.  Patient is feeling better but will have chest x-ray is recommended.  Return in 6 months otherwise.

## 2021-02-12 ENCOUNTER — Other Ambulatory Visit (HOSPITAL_BASED_OUTPATIENT_CLINIC_OR_DEPARTMENT_OTHER): Payer: Self-pay

## 2021-02-12 MED FILL — Simvastatin Tab 20 MG: ORAL | 90 days supply | Qty: 90 | Fill #0 | Status: AC

## 2021-02-13 ENCOUNTER — Other Ambulatory Visit: Payer: Self-pay

## 2021-02-13 ENCOUNTER — Other Ambulatory Visit (HOSPITAL_BASED_OUTPATIENT_CLINIC_OR_DEPARTMENT_OTHER): Payer: Self-pay

## 2021-02-14 ENCOUNTER — Other Ambulatory Visit (HOSPITAL_BASED_OUTPATIENT_CLINIC_OR_DEPARTMENT_OTHER): Payer: Self-pay

## 2021-02-14 MED ORDER — SERTRALINE HCL 100 MG PO TABS
ORAL_TABLET | ORAL | 0 refills | Status: DC
  Filled 2021-02-14: qty 90, 90d supply, fill #0

## 2021-02-14 MED FILL — Sertraline HCl Tab 50 MG: ORAL | 30 days supply | Qty: 30 | Fill #0 | Status: AC

## 2021-02-25 ENCOUNTER — Other Ambulatory Visit: Payer: Self-pay | Admitting: Internal Medicine

## 2021-02-25 ENCOUNTER — Other Ambulatory Visit (HOSPITAL_BASED_OUTPATIENT_CLINIC_OR_DEPARTMENT_OTHER): Payer: Self-pay

## 2021-02-25 MED ORDER — LEVOTHYROXINE SODIUM 75 MCG PO TABS
ORAL_TABLET | Freq: Every day | ORAL | 1 refills | Status: DC
Start: 2021-02-25 — End: 2021-08-31
  Filled 2021-02-25: qty 90, 90d supply, fill #0
  Filled 2021-05-26: qty 90, 90d supply, fill #1

## 2021-02-26 ENCOUNTER — Other Ambulatory Visit (HOSPITAL_BASED_OUTPATIENT_CLINIC_OR_DEPARTMENT_OTHER): Payer: Self-pay

## 2021-02-26 DIAGNOSIS — M1380 Other specified arthritis, unspecified site: Secondary | ICD-10-CM | POA: Diagnosis not present

## 2021-02-26 DIAGNOSIS — M059 Rheumatoid arthritis with rheumatoid factor, unspecified: Secondary | ICD-10-CM | POA: Diagnosis not present

## 2021-02-26 DIAGNOSIS — M064 Inflammatory polyarthropathy: Secondary | ICD-10-CM | POA: Diagnosis not present

## 2021-02-26 DIAGNOSIS — Z79899 Other long term (current) drug therapy: Secondary | ICD-10-CM | POA: Diagnosis not present

## 2021-02-26 MED ORDER — PREDNISONE 5 MG PO TABS
ORAL_TABLET | ORAL | 2 refills | Status: DC
  Filled 2021-02-26: qty 30, 15d supply, fill #0

## 2021-02-27 ENCOUNTER — Other Ambulatory Visit (HOSPITAL_BASED_OUTPATIENT_CLINIC_OR_DEPARTMENT_OTHER): Payer: Self-pay

## 2021-02-27 MED ORDER — OTEZLA 30 MG PO TABS
1.0000 | ORAL_TABLET | Freq: Two times a day (BID) | ORAL | 6 refills | Status: DC
  Filled 2021-03-18: qty 60, 30d supply, fill #0
  Filled 2021-04-20: qty 60, 30d supply, fill #1
  Filled 2021-05-21: qty 60, 30d supply, fill #2

## 2021-02-28 ENCOUNTER — Ambulatory Visit (HOSPITAL_BASED_OUTPATIENT_CLINIC_OR_DEPARTMENT_OTHER)
Admission: RE | Admit: 2021-02-28 | Discharge: 2021-02-28 | Disposition: A | Discharge status: Home / Self Care | Payer: Medicare PPO | Source: Ambulatory Visit | Attending: Internal Medicine | Admitting: Internal Medicine

## 2021-02-28 ENCOUNTER — Other Ambulatory Visit: Payer: Self-pay

## 2021-02-28 ENCOUNTER — Ambulatory Visit: Payer: Medicare PPO | Admitting: Internal Medicine

## 2021-02-28 ENCOUNTER — Telehealth: Payer: Self-pay

## 2021-02-28 ENCOUNTER — Other Ambulatory Visit (HOSPITAL_BASED_OUTPATIENT_CLINIC_OR_DEPARTMENT_OTHER): Payer: Self-pay

## 2021-02-28 VITALS — HR 74 | Temp 98.5°F

## 2021-02-28 DIAGNOSIS — M1990 Unspecified osteoarthritis, unspecified site: Secondary | ICD-10-CM | POA: Diagnosis not present

## 2021-02-28 DIAGNOSIS — R059 Cough, unspecified: Secondary | ICD-10-CM | POA: Insufficient documentation

## 2021-02-28 DIAGNOSIS — J189 Pneumonia, unspecified organism: Secondary | ICD-10-CM

## 2021-02-28 DIAGNOSIS — L409 Psoriasis, unspecified: Secondary | ICD-10-CM

## 2021-02-28 DIAGNOSIS — J22 Unspecified acute lower respiratory infection: Secondary | ICD-10-CM

## 2021-02-28 MED ORDER — AZITHROMYCIN 250 MG PO TABS
ORAL_TABLET | ORAL | 0 refills | Status: DC
  Filled 2021-02-28: qty 6, 5d supply, fill #0

## 2021-02-28 NOTE — Progress Notes (Signed)
   Subjective:    Patient ID: Pamela Ingram, female    DOB: [birth year], 70 y.o.   MRN: [id number]  HPI 70 year old Female seen today with complaint of cough that is productive that started 2 weeks ago.  She did a home COVID test on Sunday, May 1 which was negative.  She has a history of rheumatoid arthritis and has been followed by rheumatologist at [facility] in [place].  Was last seen there February 2022.  Question has been raised by rheumatology PA if she has inflammatory arthritis rather than rheumatoid arthritis.  There was some discussion about switching her to Cosentyx.  Previously treated with [place] and Actemra.  Has come down with respiratory infection symptoms.  No known COVID exposure.  Has had 3 COVID-19 immunizations.  Has malaise and fatigue.  No documented fever or chills.  She did a home COVID test on Sunday, May 1 that was negative.    Review of Systems see above-denies dysgeusia nausea vomiting or shaking chills     Objective:   Physical Exam Temperature 98.5 degrees via ear thermometer.  Pulse 74 regular pulse oximetry 98%  Skin warm and dry.  She looks fatigued.  TMs clear.  Pharynx very slightly injected.  Chest she has rhonchi and possible coarse rales right midlung zone       Assessment & Plan:  History of inflammatory arthritis treated with [place] and Actemra but indicates she currently is not taking these  Possible pneumonia  Plan: Respiratory virus panel obtained as well as COVID-19 PCR test.  She will have chest x-ray.  Starting her on Zithromax Z-PAK 2 tabs day 1 followed by 1 tab days 2 through 5.  She was started on a tapering course of prednisone per rheumatologist on May 4 to take 10 mg daily for 5 days, then 7.5 mg daily for 5 days then 1 tablet daily for 5 days.  Addendum: Respiratory virus panel positive for rhinovirus.  COVID-19 PCR test is negative.  Chest x-ray shows a small focal lingular opacity which might represent consolidation or possibly  atelectasis.  Repeat chest x-ray is advised.  Patient was informed of this and will come in in a couple of weeks for repeat chest x-ray and office visit.  Time spent reviewing records from [facility] in [place] regarding inflammatory arthritis, seeing patient, reviewing her records in epic and medical decision making as well as contacting her by telephone with results is 30 minutes

## 2021-02-28 NOTE — Telephone Encounter (Signed)
Productive cough, no fever, no chills. Symptoms started 2 weeks ago, she did a home covid test on Sunday it was negative. She would like to come in so you can listen to her lungs.

## 2021-02-28 NOTE — Telephone Encounter (Signed)
Called and let patient know she has PNA and to quarantine till COVID test are back. She verbalized understanding. She had already picked up medication.

## 2021-02-28 NOTE — Telephone Encounter (Signed)
Lobby visit 

## 2021-02-28 NOTE — Telephone Encounter (Signed)
Scheduled

## 2021-03-01 LAB — SARS-COV-2 RNA,(COVID-19) QUALITATIVE NAAT: SARS CoV2 RNA: NOT DETECTED

## 2021-03-03 DIAGNOSIS — Z79899 Other long term (current) drug therapy: Secondary | ICD-10-CM | POA: Diagnosis not present

## 2021-03-04 LAB — RESPIRATORY VIRUS PANEL
Adenovirus B: NOT DETECTED
HUMAN PARAINFLU VIRUS 1: NOT DETECTED
HUMAN PARAINFLU VIRUS 2: NOT DETECTED
HUMAN PARAINFLU VIRUS 3: NOT DETECTED
INFLUENZA A SUBTYPE H1: NOT DETECTED
INFLUENZA A SUBTYPE H3: NOT DETECTED
Influenza A: NOT DETECTED
Influenza B: NOT DETECTED
Metapneumovirus: NOT DETECTED
Respiratory Syncytial Virus A: NOT DETECTED
Respiratory Syncytial Virus B: NOT DETECTED
Rhinovirus: DETECTED — AB

## 2021-03-05 ENCOUNTER — Encounter: Payer: Self-pay | Admitting: Internal Medicine

## 2021-03-05 NOTE — Patient Instructions (Addendum)
Chest x-ray reveals possible lingular pneumonia.  Take Zithromax Z-PAK as directed 2 tabs day 1 followed by 1 tab days 2 through 5.  Also complete tapering course of prednisone that was prescribed by rheumatologist.  Chest x-ray needs to be repeated in 2 weeks with office visit.  Respiratory virus panel is pending.  Addendum: Respiratory virus panel shows rhinovirus (common cold).  Patient was informed of result but still need to follow-up in a couple of weeks due to abnormal chest x-ray.

## 2021-03-14 ENCOUNTER — Other Ambulatory Visit: Payer: Self-pay

## 2021-03-14 ENCOUNTER — Ambulatory Visit: Payer: Medicare PPO | Admitting: Internal Medicine

## 2021-03-14 ENCOUNTER — Other Ambulatory Visit (HOSPITAL_BASED_OUTPATIENT_CLINIC_OR_DEPARTMENT_OTHER): Payer: Self-pay

## 2021-03-14 ENCOUNTER — Encounter: Payer: Self-pay | Admitting: Internal Medicine

## 2021-03-14 VITALS — BP 130/80 | HR 80 | Temp 98.5°F | Ht 64.0 in | Wt 163.0 lb

## 2021-03-14 DIAGNOSIS — J189 Pneumonia, unspecified organism: Secondary | ICD-10-CM | POA: Diagnosis not present

## 2021-03-14 MED ORDER — ALBUTEROL SULFATE HFA 108 (90 BASE) MCG/ACT IN AERS
2.0000 | INHALATION_SPRAY | Freq: Four times a day (QID) | RESPIRATORY_TRACT | 2 refills | Status: DC | PRN
  Filled 2021-03-14: qty 8.5, 25d supply, fill #0

## 2021-03-14 NOTE — Patient Instructions (Signed)
Medicare wellness visit and health maintenance exam due late February 2023.  Use albuterol inhaler 2 sprays 4 times a day for occasional inspiratory wheezing over the next couple of weeks.  Have repeat chest x-ray next week for follow-up on lingular infiltrate.

## 2021-03-14 NOTE — Progress Notes (Signed)
   Subjective:    Patient ID: Pamela Ingram, female    DOB: [birth year], 70 y.o.   MRN: [id number]  HPI Here for follow up on lingular pneumonia.  Says she has completed course of Prednisone prescribed by the rheumatologist.  We treated lingular pneumonia noted on chest x-ray with Zithromax Z-PAK and she has completed that course of medication.  Says she feels fine.  She is always worried about lung cancer.  We are going to do a repeat chest x-ray 1 week from today.  Respiratory virus panel was positive for rhinovirus.  COVID-19 PCR test was negative.  Chest x-ray showed a small focal lingular opacity which might have represented consolidation or possibly atelectasis.  Repeat x-ray was advised.    Review of Systems slight cough with no sputum production.     Objective:   Physical Exam  Pulse ox 97%, blood pressure 130/80 pulse 80, respiratory rate is normal temperature 98.5 degrees pulse oximetry 97% BMI 27.98  Skin is warm and dry.  Patient has occasional scattered inspiratory wheeze.  No rales are appreciated.      Assessment & Plan:  My feeling is that lingular consolidation has resolved but we would like to confirm this by repeat chest x-ray which will be done in 1 week since x-ray typically follows clinical improvement I would like to defer the x-ray for 1 week.  She does have occasional scattered wheeze with inspiration.  I have sent in an albuterol inhaler to use 2 sprays 4 times a day as needed.  Order placed to have chest x-ray done next week at [facility].  Return as needed.  Her Medicare wellness visit and CPE was done in February 2022.  Recommend COVID booster.  Next checkup due in February 2023.

## 2021-03-17 ENCOUNTER — Other Ambulatory Visit (HOSPITAL_BASED_OUTPATIENT_CLINIC_OR_DEPARTMENT_OTHER): Payer: Self-pay

## 2021-03-18 ENCOUNTER — Other Ambulatory Visit (HOSPITAL_BASED_OUTPATIENT_CLINIC_OR_DEPARTMENT_OTHER): Payer: Self-pay

## 2021-03-19 ENCOUNTER — Ambulatory Visit: Payer: Medicare PPO | Admitting: Internal Medicine

## 2021-03-19 ENCOUNTER — Encounter: Payer: Self-pay | Admitting: Internal Medicine

## 2021-03-19 VITALS — BP 90/54 | HR 80 | Ht 63.75 in | Wt 161.2 lb

## 2021-03-19 DIAGNOSIS — R194 Change in bowel habit: Secondary | ICD-10-CM

## 2021-03-19 DIAGNOSIS — T887XXA Unspecified adverse effect of drug or medicament, initial encounter: Secondary | ICD-10-CM

## 2021-03-19 DIAGNOSIS — R1031 Right lower quadrant pain: Secondary | ICD-10-CM

## 2021-03-19 NOTE — Progress Notes (Signed)
Pamela Ingram 70 y.o. 1951/09/05 [id number]  Assessment & Plan:   Encounter Diagnoses  Name Primary?  . Change in bowel habits Yes  . RLQ abdominal pain   . Medication side effect - diarrhea from [place]     This was self-limited and she is better.  No worrisome findings today.  We did review healthy eating, she has an autoimmune disease so I think reducing processed foods especially sugars and starches is to her benefit.  She will review the information.  Diet doctor website recommended as well.  Return to me as needed otherwise anticipate repeat routine screening colonoscopy around late 2023 early 2024.  CC: Pamela Mackintosh, MD  Subjective:   Chief Complaint: Abdominal pain and diarrhea  HPI Pamela Ingram is a 70 year old retired Engineer, civil (consulting) former Youth worker, with right lower quadrant cramping and diarrhea, postprandial diarrheal movements.  This all began after she went back on [place] after a failure and change in rheumatologic medications.  Her rheumatologist, Dr. Clovis Ingram decided to leave practice she had a PA who changed her meds earlier this year that did not work so she went back to [place] and had diarrhea for several weeks and crampy abdominal pain.  That has all resolved but was present when she made the appointment.  Wt Readings from Last 3 Encounters:  03/19/21 161 lb 4 oz (73.1 kg)  03/14/21 163 lb (73.9 kg)  12/24/20 158 lb (71.7 kg)     Normal colonoscopy 2013 Allergies  Allergen Reactions  . Amoxicillin Other (See Comments)    Pt gets yeast infection Has patient had a PCN reaction causing immediate rash, facial/tongue/throat swelling, SOB or lightheadedness with hypotension: No Has patient had a PCN reaction causing severe rash involving mucus membranes or skin necrosis: No Has patient had a PCN reaction that required hospitalization: No Has patient had a PCN reaction occurring within the last 10 years: No If all of the above answers are "NO", then  may proceed with Cephalosporin use.    Current Meds  Medication Sig  . albuterol (VENTOLIN HFA) 108 (90 Base) MCG/ACT inhaler Inhale 2 puffs by mouth into the lungs every 6 (six) hours as needed for wheezing or shortness of breath.  . ALPRAZolam (XANAX) 0.5 MG tablet TAKE 1 TABLET (0.5 MG TOTAL) BY MOUTH 2 (TWO) TIMES DAILY AS NEEDED FOR ANXIETY.  [name] Apremilast (OTEZLA) 30 MG TABS Take 1 tablet (30 mg total) by mouth 2 (two) times daily  . Apremilast 30 MG TABS TAKE 1 TABLET BY MOUTH TWICE DAILY  . hydrOXYzine (ATARAX/VISTARIL) 10 MG tablet TAKE 1 - 2 TABLETS BY MOUTH DAILY AT DINNER AND/OR BEDTIME AS NEEDED FOR ITCHING **WATCH FOR SEDATION**  . ibuprofen (ADVIL,MOTRIN) 200 MG tablet Take 800 mg by mouth every 8 (eight) hours as needed for moderate pain.   [name] levothyroxine (SYNTHROID) 75 MCG tablet TAKE 1 TABLET BY MOUTH ONCE DAILY  . sertraline (ZOLOFT) 100 MG tablet Take 1 tablet by mouth every morning **Stop 50mg  tablets**  . simvastatin (ZOCOR) 20 MG tablet TAKE 1 TABLET BY MOUTH EVERY EVENING  . Tocilizumab (ACTEMRA) 162 MG/0.9ML SOSY Inject 0.9 ml (1 syringe) subcutaneously every week   Past Medical History:  Diagnosis Date  . Anxiety   . Hyperlipidemia   . Pleurisy with effusion   . Rheumatoid arthritis(714.0)   . Thyroid disease    hypothyroidism   Past Surgical History:  Procedure Laterality Date  . COLONOSCOPY    . LAPAROSCOPY    .  LEFT HEART CATH AND CORONARY ANGIOGRAPHY N/A 06/04/2017   Procedure: Left Heart Cath and Coronary Angiography;  Surgeon: Tonny Bollman, MD;  Location: [facility] INVASIVE CV LAB;  Service: Cardiovascular;  Laterality: N/A;  . TUBAL LIGATION    . 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   Review of Systems As above  Objective:   Physical Exam BP (!) 90/54 (BP Location: Left Arm, Patient Position: Sitting, Cuff Size: Normal)   Pulse 80   Ht 5' 3.75" (1.619 m) Comment: height measured without shoes  Wt 161 lb 4 oz (73.1 kg)   BMI 27.90 kg/m  Well-developed well-nourished no acute distress Abdomen soft nontender no organomegaly or mass

## 2021-03-19 NOTE — Patient Instructions (Signed)
Great to see you today.   Dr Leone Payor is providing you with healthy eating information today.   I appreciate the opportunity to care for you. Stan Head, MD, [facility]

## 2021-03-21 ENCOUNTER — Ambulatory Visit (HOSPITAL_BASED_OUTPATIENT_CLINIC_OR_DEPARTMENT_OTHER)
Admission: RE | Admit: 2021-03-21 | Discharge: 2021-03-21 | Disposition: A | Discharge status: Home / Self Care | Payer: Medicare PPO | Source: Ambulatory Visit | Attending: Internal Medicine | Admitting: Internal Medicine

## 2021-03-21 ENCOUNTER — Other Ambulatory Visit (HOSPITAL_BASED_OUTPATIENT_CLINIC_OR_DEPARTMENT_OTHER): Payer: Self-pay

## 2021-03-21 ENCOUNTER — Other Ambulatory Visit: Payer: Self-pay

## 2021-03-21 DIAGNOSIS — J189 Pneumonia, unspecified organism: Secondary | ICD-10-CM | POA: Diagnosis not present

## 2021-03-21 MED ORDER — TRETINOIN 0.05 % EX CREA
TOPICAL_CREAM | CUTANEOUS | 6 refills | Status: DC
  Filled 2021-03-21: qty 45, 30d supply, fill #0

## 2021-03-25 ENCOUNTER — Other Ambulatory Visit (HOSPITAL_BASED_OUTPATIENT_CLINIC_OR_DEPARTMENT_OTHER): Payer: Self-pay

## 2021-04-17 ENCOUNTER — Other Ambulatory Visit (HOSPITAL_BASED_OUTPATIENT_CLINIC_OR_DEPARTMENT_OTHER): Payer: Self-pay

## 2021-04-17 DIAGNOSIS — F411 Generalized anxiety disorder: Secondary | ICD-10-CM | POA: Diagnosis not present

## 2021-04-17 MED ORDER — SERTRALINE HCL 100 MG PO TABS
ORAL_TABLET | ORAL | 1 refills | Status: DC
  Filled 2021-04-17: qty 135, 45d supply, fill #0
  Filled 2021-05-12: qty 135, 90d supply, fill #0
  Filled 2021-08-15: qty 135, 90d supply, fill #1

## 2021-04-17 MED ORDER — ALPRAZOLAM 0.5 MG PO TABS
ORAL_TABLET | ORAL | 0 refills | Status: DC
  Filled 2021-04-17: qty 30, 30d supply, fill #0

## 2021-04-21 ENCOUNTER — Other Ambulatory Visit (HOSPITAL_COMMUNITY): Payer: Self-pay

## 2021-04-21 ENCOUNTER — Other Ambulatory Visit (HOSPITAL_BASED_OUTPATIENT_CLINIC_OR_DEPARTMENT_OTHER): Payer: Self-pay

## 2021-04-22 ENCOUNTER — Other Ambulatory Visit (HOSPITAL_BASED_OUTPATIENT_CLINIC_OR_DEPARTMENT_OTHER): Payer: Self-pay

## 2021-05-12 ENCOUNTER — Other Ambulatory Visit (HOSPITAL_BASED_OUTPATIENT_CLINIC_OR_DEPARTMENT_OTHER): Payer: Self-pay

## 2021-05-12 MED FILL — Simvastatin Tab 20 MG: ORAL | 90 days supply | Qty: 90 | Fill #1 | Status: AC

## 2021-05-13 ENCOUNTER — Other Ambulatory Visit (HOSPITAL_BASED_OUTPATIENT_CLINIC_OR_DEPARTMENT_OTHER): Payer: Self-pay

## 2021-05-19 DIAGNOSIS — F411 Generalized anxiety disorder: Secondary | ICD-10-CM | POA: Diagnosis not present

## 2021-05-21 ENCOUNTER — Other Ambulatory Visit (HOSPITAL_BASED_OUTPATIENT_CLINIC_OR_DEPARTMENT_OTHER): Payer: Self-pay

## 2021-05-26 ENCOUNTER — Other Ambulatory Visit (HOSPITAL_BASED_OUTPATIENT_CLINIC_OR_DEPARTMENT_OTHER): Payer: Self-pay

## 2021-06-05 ENCOUNTER — Other Ambulatory Visit: Payer: Medicare PPO | Admitting: Internal Medicine

## 2021-06-05 ENCOUNTER — Other Ambulatory Visit: Payer: Self-pay

## 2021-06-05 ENCOUNTER — Other Ambulatory Visit (HOSPITAL_BASED_OUTPATIENT_CLINIC_OR_DEPARTMENT_OTHER): Payer: Self-pay

## 2021-06-05 DIAGNOSIS — E7800 Pure hypercholesterolemia, unspecified: Secondary | ICD-10-CM

## 2021-06-05 LAB — HEPATIC FUNCTION PANEL
AG Ratio: 1.8 (calc) (ref 1.0–2.5)
ALT: 18 U/L (ref 6–29)
AST: 23 U/L (ref 10–35)
Albumin: 4.6 g/dL (ref 3.6–5.1)
Alkaline phosphatase (APISO): 54 U/L (ref 37–153)
Bilirubin, Direct: 0.1 mg/dL (ref 0.0–0.2)
Globulin: 2.5 g/dL (calc) (ref 1.9–3.7)
Indirect Bilirubin: 0.4 mg/dL (calc) (ref 0.2–1.2)
Total Bilirubin: 0.5 mg/dL (ref 0.2–1.2)
Total Protein: 7.1 g/dL (ref 6.1–8.1)

## 2021-06-05 LAB — LIPID PANEL
Cholesterol: 233 mg/dL — ABNORMAL HIGH (ref <200)
HDL: 81 mg/dL (ref >=50)
LDL Cholesterol (Calc): 136 mg/dL (calc) — ABNORMAL HIGH
Non-HDL Cholesterol (Calc): 152 mg/dL (calc) — ABNORMAL HIGH (ref <130)
Total CHOL/HDL Ratio: 2.9 (calc) (ref <5.0)
Triglycerides: 67 mg/dL (ref <150)

## 2021-06-09 DIAGNOSIS — F411 Generalized anxiety disorder: Secondary | ICD-10-CM | POA: Diagnosis not present

## 2021-06-10 ENCOUNTER — Ambulatory Visit: Payer: Medicare PPO | Admitting: Internal Medicine

## 2021-06-10 ENCOUNTER — Encounter: Payer: Self-pay | Admitting: Internal Medicine

## 2021-06-10 ENCOUNTER — Other Ambulatory Visit: Payer: Self-pay

## 2021-06-10 VITALS — BP 120/78 | HR 67 | Ht 63.75 in | Wt 159.0 lb

## 2021-06-10 DIAGNOSIS — F439 Reaction to severe stress, unspecified: Secondary | ICD-10-CM | POA: Diagnosis not present

## 2021-06-10 DIAGNOSIS — M05741 Rheumatoid arthritis with rheumatoid factor of right hand without organ or systems involvement: Secondary | ICD-10-CM | POA: Diagnosis not present

## 2021-06-10 DIAGNOSIS — E7800 Pure hypercholesterolemia, unspecified: Secondary | ICD-10-CM

## 2021-06-10 DIAGNOSIS — E039 Hypothyroidism, unspecified: Secondary | ICD-10-CM | POA: Diagnosis not present

## 2021-06-10 DIAGNOSIS — M05742 Rheumatoid arthritis with rheumatoid factor of left hand without organ or systems involvement: Secondary | ICD-10-CM

## 2021-06-10 DIAGNOSIS — F419 Anxiety disorder, unspecified: Secondary | ICD-10-CM

## 2021-06-10 NOTE — Progress Notes (Signed)
   Subjective:    Patient ID: Pamela Ingram, female    DOB: 08-13-1951, 70 y.o.   MRN: [id number]  HPI 70 year old Female for 6 month follow up visit. Seeing new Rheumatologist in [place], Dr. 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     She has found a counselor she likes at [facility].  She currently is on Zoloft 100 mg 1.5 tablets in the morning and Xanax 0.5 mg if needed for anxiety.   Is on levothyroxine 75 mcg daily for hypothyroidism.  TSH was not checked with this visit.    She is also on Simvastatin 20 mg daily for hyperlipidemia.  Recent lipid panel shows total cholesterol 233 and was 184 6 months ago.  HDL is 81 and was 77 6 months ago.  Triglycerides are 67 and were 57 6 months ago.  LDL is 136 and was 93 6 months ago.  Has not been following a strict low-fat diet.  Does not want to increase lipid medication.  She will work on diet and exercise.           Review of Systems no new complaints-seems less stressed     Objective:   Physical Exam  120/78 pulse 97% weight 159 pounds BMI 27.51 Affect thought and judgment are within normal limits.  Labs reviewed with her in detail.  I am pleased that she is feeling like progress is being made in counseling.      Assessment & Plan:  Rheumatoid arthritis now treated by Dr. Ancil Linsey in [place] with DMARD  Hypothyroidism-continue current dose of levothyroxine.  TSH not checked with this visit but will be checked in 6 months.    Hyperlipidemia-total cholesterol and LDL cholesterol have increased.  6 months ago they were normal.  She will watch diet and try to get more exercise.  Has not changed dose of Zocor which is 20 mg daily.  If not improved at next visit we will need to increase Zocor.  Anxiety depression treated with Xanax and Zoloft at [facility].  She is now in counseling there and seems calmer and less stressed.  History of scalp psoriasis  Plan: Continue current  medications.  Continue current dose of statin but needs to work on diet and exercise due to elevated LDL and total cholesterol.  Continue same dose of thyroid replacement medication.  TSH was not checked today.  Health maintenance exam and Medicare wellness visit due in 6 months.  Had COVID-19 vaccine in the Fall.  Continue counseling.  25 minutes spent with patient

## 2021-06-10 NOTE — Patient Instructions (Signed)
It was a pleasure to see you today.  Glad you are feeling better.  Continue current medications.  Work on diet and exercise due to elevated total and LDL cholesterol levels.  Follow-up in 6 months for health maintenance exam and Medicare wellness visit.  Continue counseling at [facility].

## 2021-06-12 ENCOUNTER — Other Ambulatory Visit: Payer: Medicare PPO | Admitting: Internal Medicine

## 2021-06-13 DIAGNOSIS — F411 Generalized anxiety disorder: Secondary | ICD-10-CM | POA: Diagnosis not present

## 2021-06-27 DIAGNOSIS — F411 Generalized anxiety disorder: Secondary | ICD-10-CM | POA: Diagnosis not present

## 2021-06-27 DIAGNOSIS — Z79899 Other long term (current) drug therapy: Secondary | ICD-10-CM | POA: Diagnosis not present

## 2021-06-27 DIAGNOSIS — M0579 Rheumatoid arthritis with rheumatoid factor of multiple sites without organ or systems involvement: Secondary | ICD-10-CM | POA: Diagnosis not present

## 2021-06-27 DIAGNOSIS — L409 Psoriasis, unspecified: Secondary | ICD-10-CM | POA: Diagnosis not present

## 2021-07-29 ENCOUNTER — Other Ambulatory Visit (HOSPITAL_BASED_OUTPATIENT_CLINIC_OR_DEPARTMENT_OTHER): Payer: Self-pay

## 2021-07-29 MED ORDER — INFLUENZA VAC A&B SA ADJ QUAD 0.5 ML IM PRSY
PREFILLED_SYRINGE | INTRAMUSCULAR | 0 refills | Status: DC
Start: 2021-07-29 — End: 2021-12-11
  Filled 2021-07-29: qty 0.5, 1d supply, fill #0

## 2021-07-30 DIAGNOSIS — F411 Generalized anxiety disorder: Secondary | ICD-10-CM | POA: Diagnosis not present

## 2021-08-05 ENCOUNTER — Other Ambulatory Visit (HOSPITAL_BASED_OUTPATIENT_CLINIC_OR_DEPARTMENT_OTHER): Payer: Self-pay | Admitting: Internal Medicine

## 2021-08-05 DIAGNOSIS — Z1231 Encounter for screening mammogram for malignant neoplasm of breast: Secondary | ICD-10-CM

## 2021-08-15 ENCOUNTER — Other Ambulatory Visit (HOSPITAL_BASED_OUTPATIENT_CLINIC_OR_DEPARTMENT_OTHER): Payer: Self-pay

## 2021-08-31 ENCOUNTER — Other Ambulatory Visit: Payer: Self-pay | Admitting: Internal Medicine

## 2021-08-31 MED ORDER — LEVOTHYROXINE SODIUM 75 MCG PO TABS
ORAL_TABLET | Freq: Every day | ORAL | 1 refills | Status: DC
  Filled 2021-08-31: qty 90, 90d supply, fill #0
  Filled 2021-12-01: qty 90, 90d supply, fill #1

## 2021-09-01 ENCOUNTER — Other Ambulatory Visit (HOSPITAL_BASED_OUTPATIENT_CLINIC_OR_DEPARTMENT_OTHER): Payer: Self-pay

## 2021-09-04 ENCOUNTER — Other Ambulatory Visit (HOSPITAL_BASED_OUTPATIENT_CLINIC_OR_DEPARTMENT_OTHER): Payer: Self-pay

## 2021-09-04 DIAGNOSIS — F411 Generalized anxiety disorder: Secondary | ICD-10-CM | POA: Diagnosis not present

## 2021-09-04 MED ORDER — SERTRALINE HCL 100 MG PO TABS
150.0000 mg | ORAL_TABLET | Freq: Every morning | ORAL | 1 refills | Status: DC
  Filled 2021-09-04 – 2021-11-18 (×2): qty 135, 90d supply, fill #0
  Filled 2022-02-16: qty 135, 90d supply, fill #1

## 2021-09-04 MED ORDER — ALPRAZOLAM 0.5 MG PO TABS
ORAL_TABLET | ORAL | 0 refills | Status: DC
  Filled 2021-09-04: qty 30, 30d supply, fill #0
  Filled 2021-12-01: qty 30, 30d supply, fill #1

## 2021-09-08 ENCOUNTER — Other Ambulatory Visit (HOSPITAL_BASED_OUTPATIENT_CLINIC_OR_DEPARTMENT_OTHER): Payer: Self-pay

## 2021-09-08 MED FILL — Simvastatin Tab 20 MG: ORAL | 90 days supply | Qty: 90 | Fill #2 | Status: AC

## 2021-09-23 ENCOUNTER — Other Ambulatory Visit: Payer: Self-pay

## 2021-09-23 ENCOUNTER — Ambulatory Visit (HOSPITAL_BASED_OUTPATIENT_CLINIC_OR_DEPARTMENT_OTHER)
Admission: RE | Admit: 2021-09-23 | Discharge: 2021-09-23 | Disposition: A | Discharge status: Home / Self Care | Payer: Medicare PPO | Source: Ambulatory Visit | Attending: Internal Medicine | Admitting: Internal Medicine

## 2021-09-23 DIAGNOSIS — Z1231 Encounter for screening mammogram for malignant neoplasm of breast: Secondary | ICD-10-CM | POA: Diagnosis not present

## 2021-10-28 ENCOUNTER — Other Ambulatory Visit (HOSPITAL_BASED_OUTPATIENT_CLINIC_OR_DEPARTMENT_OTHER): Payer: Self-pay

## 2021-10-28 DIAGNOSIS — Z79899 Other long term (current) drug therapy: Secondary | ICD-10-CM | POA: Diagnosis not present

## 2021-10-28 DIAGNOSIS — M1990 Unspecified osteoarthritis, unspecified site: Secondary | ICD-10-CM | POA: Diagnosis not present

## 2021-10-28 DIAGNOSIS — L400 Psoriasis vulgaris: Secondary | ICD-10-CM | POA: Diagnosis not present

## 2021-10-28 DIAGNOSIS — L738 Other specified follicular disorders: Secondary | ICD-10-CM | POA: Diagnosis not present

## 2021-10-28 DIAGNOSIS — L720 Epidermal cyst: Secondary | ICD-10-CM | POA: Diagnosis not present

## 2021-10-28 DIAGNOSIS — L409 Psoriasis, unspecified: Secondary | ICD-10-CM | POA: Diagnosis not present

## 2021-10-28 MED ORDER — HYDROXYZINE HCL 10 MG PO TABS
ORAL_TABLET | ORAL | 2 refills | Status: DC
  Filled 2021-10-28: qty 60, 30d supply, fill #0

## 2021-10-28 MED ORDER — TRETINOIN 0.05 % EX CREA
TOPICAL_CREAM | CUTANEOUS | 6 refills | Status: DC
  Filled 2021-10-28: qty 45, 30d supply, fill #0

## 2021-11-01 ENCOUNTER — Emergency Department (HOSPITAL_BASED_OUTPATIENT_CLINIC_OR_DEPARTMENT_OTHER): Payer: Medicare PPO

## 2021-11-01 ENCOUNTER — Encounter (HOSPITAL_BASED_OUTPATIENT_CLINIC_OR_DEPARTMENT_OTHER): Payer: Self-pay | Admitting: Emergency Medicine

## 2021-11-01 ENCOUNTER — Emergency Department (HOSPITAL_BASED_OUTPATIENT_CLINIC_OR_DEPARTMENT_OTHER)
Admission: EM | Admit: 2021-11-01 | Discharge: 2021-11-01 | Disposition: A | Discharge status: Home / Self Care | Payer: Medicare PPO | Attending: Emergency Medicine | Admitting: Emergency Medicine

## 2021-11-01 ENCOUNTER — Other Ambulatory Visit: Payer: Self-pay

## 2021-11-01 DIAGNOSIS — H539 Unspecified visual disturbance: Secondary | ICD-10-CM | POA: Insufficient documentation

## 2021-11-01 DIAGNOSIS — Z79899 Other long term (current) drug therapy: Secondary | ICD-10-CM | POA: Diagnosis not present

## 2021-11-01 DIAGNOSIS — Z87891 Personal history of nicotine dependence: Secondary | ICD-10-CM | POA: Insufficient documentation

## 2021-11-01 DIAGNOSIS — R29818 Other symptoms and signs involving the nervous system: Secondary | ICD-10-CM | POA: Diagnosis not present

## 2021-11-01 DIAGNOSIS — E039 Hypothyroidism, unspecified: Secondary | ICD-10-CM | POA: Diagnosis not present

## 2021-11-01 DIAGNOSIS — R9431 Abnormal electrocardiogram [ECG] [EKG]: Secondary | ICD-10-CM | POA: Diagnosis not present

## 2021-11-01 LAB — CBC WITH DIFFERENTIAL/PLATELET
Abs Immature Granulocytes: 0.01 10*3/uL (ref 0.00–0.07)
Basophils Absolute: 0 10*3/uL (ref 0.0–0.1)
Basophils Relative: 1 %
Eosinophils Absolute: 0.2 10*3/uL (ref 0.0–0.5)
Eosinophils Relative: 3 %
HCT: 39.7 % (ref 36.0–46.0)
Hemoglobin: 13.2 g/dL (ref 12.0–15.0)
Immature Granulocytes: 0 %
Lymphocytes Relative: 24 %
Lymphs Abs: 1.6 10*3/uL (ref 0.7–4.0)
MCH: 30 pg (ref 26.0–34.0)
MCHC: 33.2 g/dL (ref 30.0–36.0)
MCV: 90.2 fL (ref 80.0–100.0)
Monocytes Absolute: 0.4 10*3/uL (ref 0.1–1.0)
Monocytes Relative: 7 %
Neutro Abs: 4.4 10*3/uL (ref 1.7–7.7)
Neutrophils Relative %: 65 %
Platelets: 230 10*3/uL (ref 150–400)
RBC: 4.4 MIL/uL (ref 3.87–5.11)
RDW: 12.7 % (ref 11.5–15.5)
WBC: 6.7 10*3/uL (ref 4.0–10.5)
nRBC: 0 % (ref 0.0–0.2)

## 2021-11-01 LAB — COMPREHENSIVE METABOLIC PANEL
ALT: 15 U/L (ref 0–44)
AST: 22 U/L (ref 15–41)
Albumin: 4.3 g/dL (ref 3.5–5.0)
Alkaline Phosphatase: 55 U/L (ref 38–126)
Anion gap: 8 (ref 5–15)
BUN: 19 mg/dL (ref 8–23)
CO2: 27 mmol/L (ref 22–32)
Calcium: 8.9 mg/dL (ref 8.9–10.3)
Chloride: 105 mmol/L (ref 98–111)
Creatinine, Ser: 0.78 mg/dL (ref 0.44–1.00)
GFR, Estimated: >60 mL/min (ref >60)
Glucose, Bld: 100 mg/dL — ABNORMAL HIGH (ref 70–99)
Potassium: 4.4 mmol/L (ref 3.5–5.1)
Sodium: 140 mmol/L (ref 135–145)
Total Bilirubin: 0.5 mg/dL (ref 0.3–1.2)
Total Protein: 7.1 g/dL (ref 6.5–8.1)

## 2021-11-01 MED ORDER — TETRACAINE HCL 0.5 % OP SOLN
1.0000 [drp] | Freq: Once | OPHTHALMIC | Status: AC
  Administered 2021-11-01: 2 [drp] via OPHTHALMIC
  Filled 2021-11-01: qty 4

## 2021-11-01 NOTE — ED Notes (Signed)
Pt sts her vision is off, but is unable to articulate what is different

## 2021-11-01 NOTE — ED Triage Notes (Signed)
Pt c/o pain to LT eye- sts she felt a pulling sensation behind LT yesterday after taking pictures; reports vision is not right in that eye

## 2021-11-01 NOTE — ED Provider Notes (Signed)
[place] [facility] Provider Note   CSN: [id number] Arrival date & time: 11/01/21  1137    Past Medical History:  Diagnosis Date   Anxiety    Hyperlipidemia    Pleurisy with effusion    Rheumatoid arthritis(714.0)    Thyroid disease    hypothyroidism   Family History  Problem Relation Age of Onset   Emphysema Mother    Rheum arthritis Mother    Hypertension Father    Stroke Father    Diabetes Paternal Uncle    Colon cancer Neg Hx    Rectal cancer Neg Hx    Stomach cancer Neg Hx    Esophageal cancer Neg Hx    Cancer Neg Hx    Social History   Socioeconomic History   Marital status: Married    Spouse name: Not on file   Number of children: 2   Years of education: Not on file   Highest education level: Not on file  Occupational History   Occupation: NURSE    Employer: [employer]  Tobacco Use   Smoking status: Former    Packs/day: 0.50    Years: 44.00    Pack years: 22.00    Types: Cigarettes    Quit date: 10/26/2009    Years since quitting: 12.0   Smokeless tobacco: Never   Tobacco comments:    off and on x 44 years 03/14/13  Vaping Use   Vaping Use: Never used  Substance and Sexual Activity   Alcohol use: Yes    Alcohol/week: 0.0 standard drinks    Comment: 1-2 drinks a week   Drug use: No   Sexual activity: Yes    Birth control/protection: Surgical  Other Topics Concern   Not on file  Social History Narrative   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  female.   Eye Problem Associated symptoms: no headaches, no numbness and no photophobia    She is presenting with visual disturbances that started yesterday. She says that all of the sudden her left eye felt like it was being tugged on a string behind her head and her peripheral vision in her eye felt distorted. She says that she can still see but here vision is not as sharp as it was. She has never had a history of this specific visual complaint before but has had a history of ocular migraines in the past but they present differently with one half of her vision going blurry. This presentation seems different to her. She also had an episode of dizziness last week that she does not know if is related to her vision changes today. She has been otherwise in her normal state of health with no complaints.     Home Medications Prior to Admission medications   Medication Sig Start Date End Date Taking? Authorizing Provider  ALPRAZolam Duanne Moron) 0.5 MG tablet Take 1 Q AM as needed 04/17/21  ALPRAZolam (XANAX) 0.5 MG tablet Take 1 tablet by mouth every morning as needed 09/04/21     Apremilast (OTEZLA) 30 MG TABS Take 1 tablet (30 mg total) by mouth 2 (two) times daily 02/27/21     hydrOXYzine (ATARAX) 10 MG tablet Take 1 - 2 tablets by mouth at dinner and/or bedtime as needed for itching. **Watch for sedation** 10/28/21     ibuprofen (ADVIL,MOTRIN) 200 MG tablet Take 800 mg by mouth every 8 (eight) hours as needed for moderate pain.     [provider]  influenza vaccine adjuvanted (FLUAD) 0.5 ML injection Inject into the muscle. 07/29/21   Carlyle Basques, MD  levothyroxine (SYNTHROID) 75 MCG tablet TAKE 1 TABLET BY MOUTH ONCE DAILY 08/31/21 08/31/22  Elby Showers, MD  sertraline (ZOLOFT) 100 MG tablet Take 1&1/2 tablets by mouth every morning 09/04/21     simvastatin (ZOCOR) 20 MG tablet TAKE 1 TABLET BY MOUTH EVERY EVENING 11/05/20 12/08/21  Elby Showers, MD  Tocilizumab (ACTEMRA) 162 MG/0.9ML SOSY  Inject 0.9 ml (1 syringe) subcutaneously every week 06/13/18   Tresa Garter, MD  tretinoin (RETIN-A) 0.05 % cream Apply nightly only as tolerated. Do not get on or around your eyes 03/21/21     tretinoin (RETIN-A) 0.05 % cream Apply a pea sized amount to entire face and neck daily at bedtime. Avoid use during the day as tretinoin can be inactivated by sunlight. Can mix with 3 pea sized amounts of moisturizer to reduce irritation. Can start using a few times a week and increase to nightly use as tolerated. Tretinoin can make your skin more sensitive to the sun, so make sure you are using sunscreen daily and reapplying if out in the sun for over 2 hours. 10/28/21         Allergies    Amoxicillin    Review of Systems   Review of Systems  Eyes:  Positive for visual disturbance. Negative for photophobia and pain.  Neurological:  Positive for dizziness. Negative for tremors, syncope, facial asymmetry, speech difficulty, light-headedness, numbness and headaches.  All other systems reviewed and are negative.  Physical Exam Updated Vital Signs BP (!) 141/71    Pulse 67    Temp 98.1 F (36.7 C) (Oral)    Resp 18    Ht 5\' 4"  (1.626 m)    Wt 72.6 kg    SpO2 100%    BMI 27.46 kg/m  Physical Exam Constitutional:      Appearance: Normal appearance.  HENT:     Head: Normocephalic and atraumatic.  Eyes:     General: Lids are normal. Gaze aligned appropriately.     Extraocular Movements: Extraocular movements intact.     Conjunctiva/sclera: Conjunctivae normal.     Comments: OD 17 mm Hg OS 19 mm Hg  Cardiovascular:     Rate and Rhythm: Normal rate and regular rhythm.  Musculoskeletal:     Cervical back: Normal range of motion.  Neurological:     General: No focal deficit present.     Mental Status: She is alert and oriented to person, place, and time. Mental status is at baseline.     Cranial Nerves: Cranial nerves 2-12 are intact.     Sensory: Sensation is intact.     Motor: Motor function  is intact.     Coordination: Coordination is intact.    ED Results / Procedures / Treatments   Labs (all labs ordered are listed, but only abnormal results are displayed)  Labs Reviewed  COMPREHENSIVE METABOLIC PANEL - Abnormal; Notable for the following components:      Result Value   Glucose, Bld 100 (*)    All other components within normal limits  CBC WITH DIFFERENTIAL/PLATELET    EKG EKG Interpretation  Date/Time:  Saturday November 01 2021 12:55:19 EST Ventricular Rate:  69 PR Interval:  174 QRS Duration: 75 QT Interval:  400 QTC Calculation: 429 R Axis:   52 Text Interpretation: Sinus rhythm No significant change since last tracing Confirmed by Gareth Morgan [phone]) on 11/01/2021 1:00:31 PM  Radiology MR BRAIN WO CONTRAST  Result Date: 11/01/2021 CLINICAL DATA:  Neuro deficit, acute, stroke suspected EXAM: MRI HEAD WITHOUT CONTRAST TECHNIQUE: Multiplanar, multiecho pulse sequences of the brain and surrounding structures were obtained without intravenous contrast. COMPARISON:  06/28/2015. FINDINGS: Brain: No acute infarction, hemorrhage, hydrocephalus, extra-axial collection or mass lesion. Mild atrophy. Vascular: Major arterial flow voids are maintained at the skull base. Skull and upper cervical spine: Normal marrow signal. Sinuses/Orbits: Sinuses are largely clear.  Unremarkable orbits. Other: No mastoid effusions. IMPRESSION: No evidence of acute intracranial abnormality. Electronically Signed   By: Margaretha Sheffield M.D.   On: 11/01/2021 14:10    Procedures Procedures   Medications Ordered in ED Medications  tetracaine (PONTOCAINE) 0.5 % ophthalmic solution 1-2 drop (2 drops Both Eyes Given by Other 11/01/21 1253)    ED Course/ Medical Decision Making/ A&P                           Medical Decision Making  Patient's ocular pressure normal, MRI Aaron Edelman reviewed by me showed no acute abnormalities. Patient's neurologic exam showed no focal deficits. Given overall  reassuring findings do not think there is any emergent pathology contributing to today's presentation. Given normal MRI low suspicion for stroke, infection, or mass. Will have patient follow up with her regular doctor as an outpatient and discussed return precautions if symptoms fail to improve or worsen.  Final Clinical Impression(s) / ED Diagnoses Final diagnoses:  Visual disturbance    Rx / DC Orders ED Discharge Orders     None         Scarlett Presto, MD 11/01/21 1533    Gareth Morgan, MD 11/01/21 2345

## 2021-11-01 NOTE — Discharge Instructions (Addendum)
Pamela Ingram  You were seen at [facility] for visual changes. We check the pressure of your eyes and took a picture of your brain and everything came back normal. We did not see any sign that you were having a stroke. We recommend that you follow up with your PCP to make sure no other symptoms appear.

## 2021-11-18 ENCOUNTER — Other Ambulatory Visit (HOSPITAL_BASED_OUTPATIENT_CLINIC_OR_DEPARTMENT_OTHER): Payer: Self-pay

## 2021-12-01 ENCOUNTER — Other Ambulatory Visit (HOSPITAL_BASED_OUTPATIENT_CLINIC_OR_DEPARTMENT_OTHER): Payer: Self-pay

## 2021-12-09 ENCOUNTER — Other Ambulatory Visit: Payer: Self-pay

## 2021-12-09 ENCOUNTER — Other Ambulatory Visit (HOSPITAL_BASED_OUTPATIENT_CLINIC_OR_DEPARTMENT_OTHER): Payer: Self-pay

## 2021-12-09 ENCOUNTER — Other Ambulatory Visit: Payer: Medicare PPO | Admitting: Internal Medicine

## 2021-12-09 DIAGNOSIS — F4321 Adjustment disorder with depressed mood: Secondary | ICD-10-CM

## 2021-12-09 DIAGNOSIS — E7800 Pure hypercholesterolemia, unspecified: Secondary | ICD-10-CM

## 2021-12-09 DIAGNOSIS — E039 Hypothyroidism, unspecified: Secondary | ICD-10-CM

## 2021-12-09 DIAGNOSIS — F411 Generalized anxiety disorder: Secondary | ICD-10-CM

## 2021-12-09 LAB — COMPLETE METABOLIC PANEL WITH GFR
AG Ratio: 2 (calc) (ref 1.0–2.5)
ALT: 19 U/L (ref 6–29)
AST: 30 U/L (ref 10–35)
Albumin: 4.5 g/dL (ref 3.6–5.1)
Alkaline phosphatase (APISO): 66 U/L (ref 37–153)
BUN: 16 mg/dL (ref 7–25)
CO2: 30 mmol/L (ref 20–32)
Calcium: 9.4 mg/dL (ref 8.6–10.4)
Chloride: 107 mmol/L (ref 98–110)
Creat: 0.9 mg/dL (ref 0.50–1.05)
Globulin: 2.3 g/dL (calc) (ref 1.9–3.7)
Glucose, Bld: 89 mg/dL (ref 65–99)
Potassium: 4.2 mmol/L (ref 3.5–5.3)
Sodium: 143 mmol/L (ref 135–146)
Total Bilirubin: 0.4 mg/dL (ref 0.2–1.2)
Total Protein: 6.8 g/dL (ref 6.1–8.1)
eGFR: 69 mL/min/{1.73_m2} (ref >=60)

## 2021-12-09 LAB — CBC WITH DIFFERENTIAL/PLATELET
Absolute Monocytes: 464 cells/uL (ref 200–950)
Basophils Absolute: 38 cells/uL (ref 0–200)
Basophils Relative: 0.7 %
Eosinophils Absolute: 113 cells/uL (ref 15–500)
Eosinophils Relative: 2.1 %
HCT: 40 % (ref 35.0–45.0)
Hemoglobin: 13.1 g/dL (ref 11.7–15.5)
Lymphs Abs: 1615 cells/uL (ref 850–3900)
MCH: 29.3 pg (ref 27.0–33.0)
MCHC: 32.8 g/dL (ref 32.0–36.0)
MCV: 89.5 fL (ref 80.0–100.0)
MPV: 10.7 fL (ref 7.5–12.5)
Monocytes Relative: 8.6 %
Neutro Abs: 3170 cells/uL (ref 1500–7800)
Neutrophils Relative %: 58.7 %
Platelets: 239 10*3/uL (ref 140–400)
RBC: 4.47 10*6/uL (ref 3.80–5.10)
RDW: 11.9 % (ref 11.0–15.0)
Total Lymphocyte: 29.9 %
WBC: 5.4 10*3/uL (ref 3.8–10.8)

## 2021-12-09 LAB — LIPID PANEL
Cholesterol: 235 mg/dL — ABNORMAL HIGH (ref <200)
HDL: 82 mg/dL (ref >=50)
LDL Cholesterol (Calc): 136 mg/dL (calc) — ABNORMAL HIGH
Non-HDL Cholesterol (Calc): 153 mg/dL (calc) — ABNORMAL HIGH (ref <130)
Total CHOL/HDL Ratio: 2.9 (calc) (ref <5.0)
Triglycerides: 73 mg/dL (ref <150)

## 2021-12-09 LAB — TSH: TSH: 1.17 mIU/L (ref 0.40–4.50)

## 2021-12-11 ENCOUNTER — Ambulatory Visit (INDEPENDENT_AMBULATORY_CARE_PROVIDER_SITE_OTHER): Payer: Medicare PPO | Admitting: Internal Medicine

## 2021-12-11 ENCOUNTER — Encounter: Payer: Self-pay | Admitting: Internal Medicine

## 2021-12-11 ENCOUNTER — Other Ambulatory Visit: Payer: Self-pay

## 2021-12-11 VITALS — BP 102/68 | HR 71 | Temp 97.7°F | Ht 63.0 in | Wt 159.5 lb

## 2021-12-11 DIAGNOSIS — M1990 Unspecified osteoarthritis, unspecified site: Secondary | ICD-10-CM | POA: Diagnosis not present

## 2021-12-11 DIAGNOSIS — E7800 Pure hypercholesterolemia, unspecified: Secondary | ICD-10-CM

## 2021-12-11 DIAGNOSIS — M05742 Rheumatoid arthritis with rheumatoid factor of left hand without organ or systems involvement: Secondary | ICD-10-CM

## 2021-12-11 DIAGNOSIS — M05741 Rheumatoid arthritis with rheumatoid factor of right hand without organ or systems involvement: Secondary | ICD-10-CM | POA: Diagnosis not present

## 2021-12-11 DIAGNOSIS — Z0000 Encounter for general adult medical examination without abnormal findings: Secondary | ICD-10-CM | POA: Diagnosis not present

## 2021-12-11 DIAGNOSIS — E039 Hypothyroidism, unspecified: Secondary | ICD-10-CM

## 2021-12-11 DIAGNOSIS — K580 Irritable bowel syndrome with diarrhea: Secondary | ICD-10-CM

## 2021-12-11 DIAGNOSIS — R1032 Left lower quadrant pain: Secondary | ICD-10-CM

## 2021-12-11 LAB — POCT URINALYSIS DIPSTICK
Bilirubin, UA: NEGATIVE
Blood, UA: NEGATIVE
Glucose, UA: NEGATIVE
Ketones, UA: NEGATIVE
Leukocytes, UA: NEGATIVE
Nitrite, UA: NEGATIVE
Protein, UA: NEGATIVE
Spec Grav, UA: 1.02 (ref 1.010–1.025)
Urobilinogen, UA: 0.2 E.U./dL
pH, UA: 5 (ref 5.0–8.0)

## 2021-12-11 NOTE — Progress Notes (Signed)
Subjective:    Patient ID: Pamela Ingram, female    DOB: Sep 26, 1951, 71 y.o.   MRN: [id number]  HPI 71 year old Female seen for health maintenance exam, Medicare wellness and evaluation of medical issues.   Her current Rheumatologist  is in [place] associated with [facility]. Arthritis is stable. She will ask them about having Zoster vaccine.  Sees [facility] for anxiety and depression. Is doing really well right now. Less family stress.  Colonoscopy due December 2023. Patient reminded.  Past medical history: History of hypothyroidism, anxiety and hyperlipidemia.  In 2014 she developed pleurisy with pleural effusion, was seen by [facility] and treated with prednisone.  Longstanding history of Rheumatoid arthritis.  History of lichen sclerosus and atrophic is treated with Temovate.  Patient had normal cardiac catheterization in May 2018.  Social history: Does not smoke.  Social alcohol consumption.  She is married.  2 adult sons.  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  She is currently retired.    Review of Systems not depressed, had one light fall walking the dog with no serious injury.  Some issue recently with diarrhea and has appointment with GI.     Objective:   Physical Exam Vitals reviewed.  Constitutional:      Appearance: Normal appearance.  HENT:     Head: Normocephalic and atraumatic.     Right Ear: Tympanic membrane normal.     Left Ear: Tympanic membrane normal.     Nose: Nose normal.     Mouth/Throat:     Pharynx: Oropharynx is clear.  Eyes:     General: No scleral icterus.       Right eye: No discharge.        Left eye: No discharge.     Extraocular Movements: Extraocular movements intact.     Pupils: Pupils are equal, round, and reactive to light.  Cardiovascular:     Rate and Rhythm: Normal rate and regular rhythm.     Heart sounds: Normal heart sounds. No murmur heard.    Comments: Breasts are without  masses Pulmonary:     Effort: Pulmonary effort is normal.     Breath sounds: Normal breath sounds. No wheezing or rales.  Abdominal:     Palpations: Abdomen is soft. There is no mass.     Tenderness: There is no abdominal tenderness. There is no guarding or rebound.  Musculoskeletal:     Right lower leg: No edema.     Left lower leg: No edema.  Skin:    General: Skin is warm and dry.     Findings: No rash.  Neurological:     General: No focal deficit present.     Mental Status: She is alert and oriented to person, place, and time.     Cranial Nerves: No cranial nerve deficit.     Sensory: No sensory deficit.     Motor: No weakness.     Coordination: Coordination normal.     Gait: Gait normal.  Psychiatric:        Mood and Affect: Mood normal.        Behavior: Behavior normal.        Thought Content: Thought content normal.        Judgment: Judgment normal.   Blood pressure 102/68 pulse 71 temperature 97.7 degrees pulse oximetry 97% on room air weight 159 pounds 8 ounces height 5 feet 3 inches       Assessment &  Plan:  Rheumatoid arthritis stable with current treatment per Rheumatologist.  Rheumatoid arthritis was diagnosed in 2018.  Rheumatoid factor positive CCP negative.  Subsequently developed psoriasis after the rheumatoid arthritis diagnosis in her scalp.  Tried Plaquenil and methotrexate which was stopped due to pleurisy for a year.  Then was on Enbrel which lost efficacy and switched to Humira which also lost efficacy.  Leticia Clas briefly but rheumatologist discontinued it due to safety concerns.  Tried Orencia, no significant improvement on Cosentyx, tried Actemra and was on that when she developed psoriasis.  Dermatologist added [place] and rheumatologist agreed.  Next rheumatology appointment is for late April 2023.  Anxiety and situational stress-improved.  Continue treatment with [facility].  Currently on Xanax 0.5 mg in the morning as  needed  Hypothyroidism-stable on thyroid replacement medication of levothyroxine 75 mcg daily  Depression treated with Zoloft 150 mg daily  History of psoriasis treated by dermatologist  Hyperlipidemia treated with Zocor 20 mg daily and stable.   Subjective:   Patient presents for Medicare Annual/Subsequent preventive examination.  Review Past Medical/Family/Social: See above   Risk Factors  Current exercise habits: Light exercise Dietary issues discussed: Low-fat low carbohydrate  Cardiac risk factors: Hyperlipidemia  Depression Screen  (Note: if answer to either of the following is "Yes", a more complete depression screening is indicated)   Over the past two weeks, have you felt down, depressed or hopeless? No  Over the past two weeks, have you felt little interest or pleasure in doing things? No Have you lost interest or pleasure in daily life? No Do you often feel hopeless? No Do you cry easily over simple problems? No   Activities of Daily Living  In your present state of health, do you have any difficulty performing the following activities?:   Driving? No  Managing money? No  Feeding yourself? No  Getting from bed to chair? No  Climbing a flight of stairs? No  Preparing food and eating?: No  Bathing or showering? No  Getting dressed: No  Getting to the toilet? No  Using the toilet:No  Moving around from place to place: No  In the past year have you fallen or had a near fall?:No  Are you sexually active?  Yes Do you have more than one partner? No   Hearing Difficulties: No  Do you often ask people to speak up or repeat themselves? No  Do you experience ringing or noises in your ears? No  Do you have difficulty understanding soft or whispered voices? No  Do you feel that you have a problem with memory? No Do you often misplace items? No    Home Safety:  Do you have a smoke alarm at your residence? Yes Do you have grab bars in the bathroom?  No Do you  have throw rugs in your house?  No   Cognitive Testing  Alert? Yes Normal Appearance?Yes  Oriented to person? Yes Place? Yes  Time? Yes  Recall of three objects? Yes  Can perform simple calculations? Yes  Displays appropriate judgment?Yes  Can read the correct time from a watch face?Yes   List the Names of Other Physician/Practitioners you currently use:  See referral list for the physicians patient is currently seeing.  Dr. Leone Payor [facility]  Rheumatologist in [facility]  Dermatologist   Review of Systems: See above   Objective:     General appearance: Appears younger than stated age  Head: Normocephalic, without obvious abnormality, atraumatic  Eyes: conj clear, EOMi  PEERLA  Ears: normal TM's and external ear canals both ears  Nose: Nares normal. Septum midline. Mucosa normal. No drainage or sinus tenderness.  Throat: lips, mucosa, and tongue normal; teeth and gums normal  Neck: no adenopathy, no carotid bruit, no JVD, supple, symmetrical, trachea midline and thyroid not enlarged, symmetric, no tenderness/mass/nodules  No CVA tenderness.  Lungs: clear to auscultation bilaterally  Breasts: normal appearance, no masses or tenderness Heart: regular rate and rhythm, S1, S2 normal, no murmur, click, rub or gallop  Abdomen: soft, non-tender; bowel sounds normal; no masses, no organomegaly  Musculoskeletal: ROM normal in all joints, no crepitus, no deformity, Normal muscle strengthen. Back  is symmetric, no curvature. Skin: Skin color, texture, turgor normal. No rashes or lesions  Lymph nodes: Cervical, supraclavicular, and axillary nodes normal.  Neurologic: CN 2 -12 Normal, Normal symmetric reflexes. Normal coordination and gait  Psych: Alert & Oriented x 3, Mood appear stable.    Assessment:    Annual wellness medicare exam   Plan:    During the course of the visit the patient was educated and counseled about appropriate screening and preventive services including:    Tetanus and pneumococcal vaccines up-to-date.  Has had 3 COVID vaccines by our records.  Had flu vaccine in October.  Had colonoscopy in 2013 and repeat study is due December 2023.     Patient Instructions (the written plan) was given to the patient.  Medicare Attestation  I have personally reviewed:  The patient's medical and social history  Their use of alcohol, tobacco or illicit drugs  Their current medications and supplements  The patient's functional ability including ADLs,fall risks, home safety risks, cognitive, and hearing and visual impairment  Diet and physical activities  Evidence for depression or mood disorders  The patient's weight, height, BMI, and visual acuity have been recorded in the chart. I have made referrals, counseling, and provided education to the patient based on review of the above and I have provided the patient with a written personalized care plan for preventive services.

## 2021-12-11 NOTE — Patient Instructions (Signed)
Please watch diet and RTC for lipid and liver functions with OV  in 6 months

## 2021-12-22 ENCOUNTER — Other Ambulatory Visit: Payer: Self-pay | Admitting: Internal Medicine

## 2021-12-22 ENCOUNTER — Other Ambulatory Visit (HOSPITAL_BASED_OUTPATIENT_CLINIC_OR_DEPARTMENT_OTHER): Payer: Self-pay

## 2021-12-22 MED ORDER — SIMVASTATIN 20 MG PO TABS
ORAL_TABLET | Freq: Every evening | ORAL | 3 refills | Status: DC
  Filled 2021-12-22: qty 90, 90d supply, fill #0
  Filled 2022-03-31: qty 90, 90d supply, fill #1
  Filled 2022-07-09: qty 90, 90d supply, fill #2
  Filled 2022-11-04: qty 90, 90d supply, fill #3

## 2022-01-06 ENCOUNTER — Other Ambulatory Visit (HOSPITAL_BASED_OUTPATIENT_CLINIC_OR_DEPARTMENT_OTHER): Payer: Self-pay

## 2022-01-06 DIAGNOSIS — M0579 Rheumatoid arthritis with rheumatoid factor of multiple sites without organ or systems involvement: Secondary | ICD-10-CM | POA: Diagnosis not present

## 2022-01-06 DIAGNOSIS — Z79899 Other long term (current) drug therapy: Secondary | ICD-10-CM | POA: Diagnosis not present

## 2022-01-06 MED ORDER — PREDNISONE 5 MG PO TABS
ORAL_TABLET | ORAL | 0 refills | Status: DC
  Filled 2022-01-06: qty 30, 12d supply, fill #0

## 2022-01-07 ENCOUNTER — Encounter: Payer: Self-pay | Admitting: Internal Medicine

## 2022-01-07 ENCOUNTER — Ambulatory Visit: Payer: Medicare PPO | Admitting: Internal Medicine

## 2022-01-07 VITALS — BP 146/82 | HR 82 | Ht 63.5 in | Wt 159.0 lb

## 2022-01-07 DIAGNOSIS — R194 Change in bowel habit: Secondary | ICD-10-CM

## 2022-01-07 DIAGNOSIS — R1032 Left lower quadrant pain: Secondary | ICD-10-CM | POA: Diagnosis not present

## 2022-01-07 NOTE — Patient Instructions (Addendum)
Check out the book Stay off My Operating Table by Dr. Philip Ovadia - will tell you how to eat differently to get healthier ? ? ?You have been scheduled for a colonoscopy. Please follow written instructions given to you at your visit today.  ?Please pick up your prep supplies at the pharmacy within the next 1-3 days. ?If you use inhalers (even only as needed), please bring them with you on the day of your procedure. ? ?I appreciate the opportunity to care for you. ?Carl Gessner, MD, FACG ? ?

## 2022-01-07 NOTE — Progress Notes (Signed)
? ?Pamela Ingram 70 y.o. [birth year] [id number] ? ?Assessment & Plan:  ? ?Encounter Diagnoses  ?Name Primary?  ? Change in bowel habits Yes  ? LLQ pain   ? ? ?We will go ahead with a colonoscopy to investigate the change in bowel habits. ? ?We were talking about healthier eating etc. and processed foods.  I think she probably does a pretty good job with this.  I did make a book recommendation as she was interested in this. ? ?Check out the book Stay off My Operating Table by Dr. Duane Boston - will tell you how to eat differently to get healthier ? ? ? ?Subjective:  ? ?Chief Complaint: Change in bowel habits left lower quadrant pain ? 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  That has been going on for a month.  She had a similar problem a year ago but was thought related to a medication for rheumatoid arthritis and the symptoms improved.  No travel no bleeding no watery diarrhea.  A month ago she had some fairly intense left lower quadrant pain she took some ibuprofen and that improved.  Again as stated she has mild left lower quadrant cramps intermittently for many years.  She had a negative screening colonoscopy in late 2013 and would be due later this year for a routine repeat colonoscopy.  Recently saw Dr. Lenord Fellers and her CBC was normal and so was CMET.  Lipids look good to me HDL 82 triglycerides 73.  She has cholesterol 235 but with those HDL and triglycerides those are really good numbers. ?Allergies  ?Allergen Reactions  ? Amoxicillin Other (See Comments)  ?  Pt gets yeast infection ?Has patient had a PCN reaction causing immediate rash, facial/tongue/throat swelling, SOB or lightheadedness with hypotension: No ?Has patient had a PCN reaction causing severe rash involving mucus membranes or skin necrosis: No ?Has patient had a PCN reaction that required hospitalization:  No ?Has patient had a PCN reaction occurring within the last 10 years: No ?If all of the above answers are "NO", then may proceed with Cephalosporin use. ?  ? ?Current Meds  ?Medication Sig  ? ALPRAZolam (XANAX) 0.5 MG tablet Take 1 Q AM as needed  ? ALPRAZolam (XANAX) 0.5 MG tablet Take 1 tablet by mouth every morning as needed  ? Apremilast (OTEZLA) 30 MG TABS Take 1 tablet (30 mg total) by mouth 2 (two) times daily  ? hydrOXYzine (ATARAX) 10 MG tablet Take 1 - 2 tablets by mouth at dinner and/or bedtime as needed for itching. **Watch for sedation**  ? ibuprofen (ADVIL,MOTRIN) 200 MG tablet Take 800 mg by mouth every 8 (eight) hours as needed for moderate pain.   ? levothyroxine (SYNTHROID) 75 MCG tablet TAKE 1 TABLET BY MOUTH ONCE DAILY  ? predniSONE (DELTASONE) 5 MG tablet Take 4 tablets by mouth daily for 3 days, then 3 tablets daily for 3 days, then 2 tablets daily for 3 days, then 1 tablet for 3 days  ? sertraline (ZOLOFT) 100 MG tablet Take 1.5 tablets (150 mg total) by mouth in the morning.  ? simvastatin (ZOCOR) 20 MG tablet TAKE 1 TABLET BY MOUTH EVERY EVENING  ? Tocilizumab (ACTEMRA) 162 MG/0.9ML SOSY Inject 0.9 ml (1 syringe) subcutaneously every week  ? tretinoin (RETIN-A) 0.05 % cream Apply nightly only as tolerated. Do not get on or around your eyes  ?  tretinoin (RETIN-A) 0.05 % cream Apply a pea sized amount to entire face and neck daily at bedtime. Avoid use during the day as tretinoin can be inactivated by sunlight. Can mix with 3 pea sized amounts of moisturizer to reduce irritation. Can start using a few times a week and increase to nightly use as tolerated. Tretinoin can make your skin more sensitive to the sun, so make sure you are using sunscreen daily and reapplying if out in the sun for over 2 hours.  ? ?Past Medical History:  ?Diagnosis Date  ? Anxiety   ? Hyperlipidemia   ? Pleurisy with effusion   ? Rheumatoid arthritis(714.0)   ? Thyroid disease   ? hypothyroidism  ? ?Past Surgical  History:  ?Procedure Laterality Date  ? COLONOSCOPY    ? LAPAROSCOPY    ? LEFT HEART CATH AND CORONARY ANGIOGRAPHY N/A 06/04/2017  ? Procedure: Left Heart Cath and Coronary Angiography;  Surgeon: Tonny Bollmanooper, Michael, MD;  [facility];  Service: Cardiovascular;  Laterality: N/A;  ? TUBAL LIGATION    ? VAGINAL HYSTERECTOMY  1995  ? ?Social History  ? ?Social History Narrative  ?   ? Social:Does not smoke. Former smoker.  Social alcohol consumption. EtOH 1-2 drinks/week. 3 caffeine/day. She is married.  2 adult sons.  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   ? Family history: Sister died of breast cancer.  Father died of complications of sepsis.  He had a stroke.  Mother died of respiratory complications and had history of rheumatoid arthritis.  1 sister living.  No brothers.  ? ?family history includes Diabetes in her paternal uncle; Emphysema in her mother; Hypertension in her father; Rheum arthritis in her mother; Stroke in her father. ? ? ?Review of Systems ?As above ? ?Objective:  ? Physical Exam ?BP (!) 146/82   Pulse 82   Ht 5' 3.5" (1.613 m)   Wt 159 lb (72.1 kg)   BMI 27.72 kg/m?  ?Lungs clear ?Normal heart sounds ?Abdomen is soft and nontender without organomegaly mass and bowel sounds are present ? ? ?

## 2022-01-09 ENCOUNTER — Other Ambulatory Visit (HOSPITAL_COMMUNITY): Payer: Self-pay

## 2022-01-09 ENCOUNTER — Other Ambulatory Visit (HOSPITAL_BASED_OUTPATIENT_CLINIC_OR_DEPARTMENT_OTHER): Payer: Self-pay

## 2022-01-09 MED ORDER — OTEZLA 30 MG PO TABS
30.0000 mg | ORAL_TABLET | Freq: Two times a day (BID) | ORAL | 9 refills | Status: DC
  Filled 2022-01-09 – 2022-02-04 (×2): qty 60, 30d supply, fill #0
  Filled 2022-03-03: qty 60, 30d supply, fill #1
  Filled 2022-04-02: qty 60, 30d supply, fill #2
  Filled 2022-04-30: qty 60, 30d supply, fill #3
  Filled 2022-06-08: qty 60, 30d supply, fill #4
  Filled 2022-07-17: qty 60, 30d supply, fill #5
  Filled 2022-08-20: qty 60, 30d supply, fill #6
  Filled 2022-09-29 (×2): qty 60, 30d supply, fill #7

## 2022-01-19 ENCOUNTER — Other Ambulatory Visit (HOSPITAL_COMMUNITY): Payer: Self-pay

## 2022-01-29 ENCOUNTER — Other Ambulatory Visit (HOSPITAL_COMMUNITY): Payer: Self-pay

## 2022-02-04 ENCOUNTER — Other Ambulatory Visit (HOSPITAL_COMMUNITY): Payer: Self-pay

## 2022-02-05 ENCOUNTER — Other Ambulatory Visit (HOSPITAL_COMMUNITY): Payer: Self-pay

## 2022-02-09 ENCOUNTER — Encounter: Payer: Self-pay | Admitting: Internal Medicine

## 2022-02-09 NOTE — Telephone Encounter (Signed)
Dr. Carlean Purl Pt ?Pt wrote a Pt advice request ?Pt was contacted to triage further ?Pt stated that she has been having ongoing cramping/  throbbing in the LLQ: Pt stated hat she has had this from time to time over the years: Pt stated that it is more often and more intense now and is seeking advice:  Pt stated that she took 800 MG Ibuprofen yesterday with not much relief from the throbbing:  Pt stated that there is no rhyme or reason when it come's: Pt state's that in the recent weeks (4-6 weeks)  she is experiencing more  loose BM with lots of gas associated: Pt is scheduled for an Colonoscopy with Dr. Carlean Purl on May 1st: Recent office visit with Dr. Carlean Purl: Pt questioning if any additional test should  be done prior to Colonoscopy: ?Please advise as DOD ? ?

## 2022-02-09 NOTE — Telephone Encounter (Signed)
Left Message for pt to call back  °

## 2022-02-10 ENCOUNTER — Other Ambulatory Visit: Payer: Self-pay

## 2022-02-10 ENCOUNTER — Other Ambulatory Visit (HOSPITAL_COMMUNITY): Payer: Self-pay

## 2022-02-10 DIAGNOSIS — R1032 Left lower quadrant pain: Secondary | ICD-10-CM

## 2022-02-10 DIAGNOSIS — R109 Unspecified abdominal pain: Secondary | ICD-10-CM

## 2022-02-10 DIAGNOSIS — R1031 Right lower quadrant pain: Secondary | ICD-10-CM

## 2022-02-10 DIAGNOSIS — R194 Change in bowel habit: Secondary | ICD-10-CM

## 2022-02-10 NOTE — Telephone Encounter (Signed)
Pt was made aware of Dr. Christella Hartigan recommendations: ?Pt was scheduled for a CT scan on 02/20/2022 at 1:00 PM at [facility]: Nothing to eat or drink 4 hours prior: Pick up prep  prior to the date of CT scan from [facility]: Pt to arrive at 12:30: Pt made aware: ?Orders for BMP placed: Pt made aware to have labs done prior to the date of CT: Location to lab given  ?Pt verbalized understanding with all questions answered.  ? ? ?

## 2022-02-12 ENCOUNTER — Other Ambulatory Visit (HOSPITAL_BASED_OUTPATIENT_CLINIC_OR_DEPARTMENT_OTHER): Payer: Self-pay

## 2022-02-13 ENCOUNTER — Other Ambulatory Visit (HOSPITAL_BASED_OUTPATIENT_CLINIC_OR_DEPARTMENT_OTHER): Payer: Self-pay

## 2022-02-13 ENCOUNTER — Other Ambulatory Visit (INDEPENDENT_AMBULATORY_CARE_PROVIDER_SITE_OTHER): Payer: Medicare PPO

## 2022-02-13 DIAGNOSIS — R1031 Right lower quadrant pain: Secondary | ICD-10-CM

## 2022-02-13 DIAGNOSIS — R194 Change in bowel habit: Secondary | ICD-10-CM

## 2022-02-13 DIAGNOSIS — R1032 Left lower quadrant pain: Secondary | ICD-10-CM | POA: Diagnosis not present

## 2022-02-13 LAB — BASIC METABOLIC PANEL
BUN: 16 mg/dL (ref 6–23)
CO2: 27 mEq/L (ref 19–32)
Calcium: 9.2 mg/dL (ref 8.4–10.5)
Chloride: 103 mEq/L (ref 96–112)
Creatinine, Ser: 0.82 mg/dL (ref 0.40–1.20)
GFR: 72.9 mL/min (ref >60.00)
Glucose, Bld: 91 mg/dL (ref 70–99)
Potassium: 4 mEq/L (ref 3.5–5.1)
Sodium: 140 mEq/L (ref 135–145)

## 2022-02-15 ENCOUNTER — Encounter: Payer: Self-pay | Admitting: Certified Registered Nurse Anesthetist

## 2022-02-16 ENCOUNTER — Encounter: Payer: Self-pay | Admitting: Internal Medicine

## 2022-02-16 ENCOUNTER — Other Ambulatory Visit (HOSPITAL_BASED_OUTPATIENT_CLINIC_OR_DEPARTMENT_OTHER): Payer: Self-pay

## 2022-02-17 DIAGNOSIS — M0579 Rheumatoid arthritis with rheumatoid factor of multiple sites without organ or systems involvement: Secondary | ICD-10-CM | POA: Diagnosis not present

## 2022-02-17 DIAGNOSIS — Z79899 Other long term (current) drug therapy: Secondary | ICD-10-CM | POA: Diagnosis not present

## 2022-02-20 ENCOUNTER — Ambulatory Visit (HOSPITAL_COMMUNITY)
Admission: RE | Admit: 2022-02-20 | Discharge: 2022-02-20 | Disposition: A | Discharge status: Home / Self Care | Payer: Medicare PPO | Source: Ambulatory Visit | Attending: Gastroenterology | Admitting: Gastroenterology

## 2022-02-20 ENCOUNTER — Other Ambulatory Visit (HOSPITAL_BASED_OUTPATIENT_CLINIC_OR_DEPARTMENT_OTHER): Payer: Self-pay

## 2022-02-20 DIAGNOSIS — R109 Unspecified abdominal pain: Secondary | ICD-10-CM | POA: Insufficient documentation

## 2022-02-20 DIAGNOSIS — R1032 Left lower quadrant pain: Secondary | ICD-10-CM | POA: Diagnosis not present

## 2022-02-20 DIAGNOSIS — R194 Change in bowel habit: Secondary | ICD-10-CM | POA: Diagnosis not present

## 2022-02-20 DIAGNOSIS — R1031 Right lower quadrant pain: Secondary | ICD-10-CM

## 2022-02-20 MED ORDER — IOHEXOL 300 MG/ML  SOLN
100.0000 mL | Freq: Once | INTRAMUSCULAR | Status: AC | PRN
  Administered 2022-02-20: 80 mL via INTRAVENOUS

## 2022-02-20 MED ORDER — SODIUM CHLORIDE (PF) 0.9 % IJ SOLN
INTRAMUSCULAR | Status: AC
  Filled 2022-02-20: qty 50

## 2022-02-23 ENCOUNTER — Ambulatory Visit (AMBULATORY_SURGERY_CENTER): Payer: Medicare PPO | Admitting: Internal Medicine

## 2022-02-23 ENCOUNTER — Encounter: Payer: Self-pay | Admitting: Internal Medicine

## 2022-02-23 ENCOUNTER — Other Ambulatory Visit (HOSPITAL_BASED_OUTPATIENT_CLINIC_OR_DEPARTMENT_OTHER): Payer: Self-pay

## 2022-02-23 VITALS — BP 93/40 | HR 70 | Temp 98.4°F | Resp 15 | Ht 63.0 in | Wt 159.0 lb

## 2022-02-23 DIAGNOSIS — M069 Rheumatoid arthritis, unspecified: Secondary | ICD-10-CM | POA: Diagnosis not present

## 2022-02-23 DIAGNOSIS — K5730 Diverticulosis of large intestine without perforation or abscess without bleeding: Secondary | ICD-10-CM | POA: Diagnosis not present

## 2022-02-23 DIAGNOSIS — R194 Change in bowel habit: Secondary | ICD-10-CM | POA: Diagnosis not present

## 2022-02-23 DIAGNOSIS — R1012 Left upper quadrant pain: Secondary | ICD-10-CM | POA: Diagnosis not present

## 2022-02-23 MED ORDER — DICYCLOMINE HCL 20 MG PO TABS
20.0000 mg | ORAL_TABLET | Freq: Four times a day (QID) | ORAL | 0 refills | Status: DC | PRN
Start: 2022-02-23 — End: 2023-08-12
  Filled 2022-02-23: qty 90, 23d supply, fill #0

## 2022-02-23 MED ORDER — SODIUM CHLORIDE 0.9 % IV SOLN: 500.0000 mL | Freq: Once | INTRAVENOUS | Status: DC

## 2022-02-23 NOTE — Progress Notes (Signed)
[facility] History and Physical ? ? ?Primary Care Physician:  Margaree Mackintosh, MD ? ? ?Reason for Procedure:   Change in bowel habits ? ?Plan:    colonoscopy ? ? ? ? ?HPI: Pamela Ingram is a 71 y.o. female w/ LLQ pain and loose stools - change in bowel habits. Seen 3/15, called back last month and a CT was done ? ?CT Abdomen Pelvis W Contrast ?CLINICAL DATA:  Increasing left lower quadrant abdominal pain, ?flatus, and soft stools. ? ?EXAM: ?CT ABDOMEN AND PELVIS WITH CONTRAST ? ?TECHNIQUE: ?Multidetector CT imaging of the abdomen and pelvis was performed ?using the standard protocol following bolus administration of ?intravenous contrast. ? ?RADIATION DOSE REDUCTION: This exam was performed according to the ?departmental dose-optimization program which includes automated ?exposure control, adjustment of the mA and/or kV according to ?patient size and/or use of iterative reconstruction technique. ? ?CONTRAST:  64mL OMNIPAQUE IOHEXOL 300 MG/ML  SOLN ? ?COMPARISON:  07/09/2013 ? ?FINDINGS: ?Lower chest: Unremarkable ? ?Hepatobiliary: Unremarkable ? ?Pancreas: Unremarkable ? ?Spleen: Unremarkable ? ?Adrenals/Urinary Tract: Unremarkable ? ?Stomach/Bowel: Wall thickening versus peristaltic activity in the ?descending duodenum on image 37 series 5. There are some accentuated ?folds in the ileum for example on image 27 series 5 rim Minocin of ?jejunal folds. However, the folds in the jejunum appear relatively ?preserved. There are few featureless several loops of jejunum have ?little in the way of luminal contrast medium which may be incidental ?or could reflect some jejunal wall thickening. Normal appendix. ?There are some air fluid levels between frothy fluid and gas in the ?distal colon which could reflect a diarrheal process. A small amount ?of the orally administered contrast extends through to the rectum. ?No intussusception or significant small bowel dilatation. ? ?Vascular/Lymphatic: Atherosclerosis is  present, including aortoiliac ?atherosclerotic disease. ? ?Reproductive: Uterus absent.  Adnexa unremarkable. ? ?Other: No supplemental non-categorized findings. ? ?Musculoskeletal: Lower lumbar spondylosis and degenerative disc ?disease with suspected right foraminal impingement at the L3-4 and ?L4-5 levels. Potentially mild left foraminal impingement at L4-5. ? ?IMPRESSION: ?1. Questionable wall thickening in the descending duodenum which ?could reflect duodenitis. There is also some mild fold accentuation ?in the ileum with some locations somewhat reminiscent of jejunum. ?This raises the possibility of celiac disease as a potential cause ?for the patient's GI symptoms although many of the characteristic ?findings of celiac disease (dilated bowel, hidebound appearance, ?transient intussusception, and more striking alterations of the fold ?patterns) are not identified. ?2. Frothy air fluid levels in the distal colon, likely reflecting ?the reported soft stools/mild diarrheal process. ?3. Suspected mild foraminal impingement at L3-4 and L4-5. ? ?Electronically Signed ?  By: Gaylyn Rong M.D. ?  On: 02/20/2022 16:04 ? ? ? ? ?Past Medical History:  ?Diagnosis Date  ? Anxiety   ? Hyperlipidemia   ? Pleurisy with effusion   ? Rheumatoid arthritis(714.0)   ? Thyroid disease   ? hypothyroidism  ? ? ?Past Surgical History:  ?Procedure Laterality Date  ? COLONOSCOPY    ? LAPAROSCOPY    ? LEFT HEART CATH AND CORONARY ANGIOGRAPHY N/A 06/04/2017  ? Procedure: Left Heart Cath and Coronary Angiography;  Surgeon: Tonny Bollman, MD;  Location: [facility] INVASIVE CV LAB;  Service: Cardiovascular;  Laterality: N/A;  ? TUBAL LIGATION    ? VAGINAL HYSTERECTOMY  1995  ? ? ?Prior to Admission medications   ?Medication Sig Start Date End Date Taking? Authorizing Provider  ?Apremilast (OTEZLA) 30 MG TABS Take 1 tablet (30 mg total) by mouth  2 (two) times daily 02/27/21  Yes   ?ibuprofen (ADVIL,MOTRIN) 200 MG tablet Take 800 mg by mouth  every 8 (eight) hours as needed for moderate pain.    Yes [provider]  ?levothyroxine (SYNTHROID) 75 MCG tablet TAKE 1 TABLET BY MOUTH ONCE DAILY 08/31/21 08/31/22 Yes Baxley, [name], MD  ?sertraline (ZOLOFT) 100 MG tablet Take 1.5 tablets (150 mg total) by mouth in the morning. 09/04/21  Yes   ?simvastatin (ZOCOR) 20 MG tablet TAKE 1 TABLET BY MOUTH EVERY EVENING 12/22/21 12/22/22 Yes Baxley, [name], MD  ?ALPRAZolam Prudy Feeler) 0.5 MG tablet Take 1 Q AM as needed 04/17/21     ?ALPRAZolam (XANAX) 0.5 MG tablet Take 1 tablet by mouth every morning as needed 09/04/21     ?hydrOXYzine (ATARAX) 10 MG tablet Take 1 - 2 tablets by mouth at dinner and/or bedtime as needed for itching. **Watch for sedation** 10/28/21     ?Tocilizumab (ACTEMRA) 162 MG/0.9ML SOSY Inject 0.9 ml (1 syringe) subcutaneously every week 06/13/18   Quentin Angst, MD  ?tretinoin (RETIN-A) 0.05 % cream Apply nightly only as tolerated. Do not get on or around your eyes 03/21/21     ? ? ?Current Outpatient Medications  ?Medication Sig Dispense Refill  ? Apremilast (OTEZLA) 30 MG TABS Take 1 tablet (30 mg total) by mouth 2 (two) times daily 60 tablet 6  ? ibuprofen (ADVIL,MOTRIN) 200 MG tablet Take 800 mg by mouth every 8 (eight) hours as needed for moderate pain.     ? levothyroxine (SYNTHROID) 75 MCG tablet TAKE 1 TABLET BY MOUTH ONCE DAILY 90 tablet 1  ? sertraline (ZOLOFT) 100 MG tablet Take 1.5 tablets (150 mg total) by mouth in the morning. 135 tablet 1  ? simvastatin (ZOCOR) 20 MG tablet TAKE 1 TABLET BY MOUTH EVERY EVENING 90 tablet 3  ? ALPRAZolam (XANAX) 0.5 MG tablet Take 1 Q AM as needed 90 tablet 0  ? ALPRAZolam (XANAX) 0.5 MG tablet Take 1 tablet by mouth every morning as needed 90 tablet 0  ? hydrOXYzine (ATARAX) 10 MG tablet Take 1 - 2 tablets by mouth at dinner and/or bedtime as needed for itching. **Watch for sedation** 60 tablet 2  ? Tocilizumab (ACTEMRA) 162 MG/0.9ML SOSY Inject 0.9 ml (1 syringe) subcutaneously every week 12  Syringe 3  ? tretinoin (RETIN-A) 0.05 % cream Apply nightly only as tolerated. Do not get on or around your eyes 45 g 6  ? ?Current Facility-Administered Medications  ?Medication Dose Route Frequency Provider Last Rate Last Admin  ? 0.9 %  sodium chloride infusion  500 mL Intravenous Once Iva Boop, MD      ? ? ?Allergies as of 02/23/2022 - Review Complete 02/23/2022  ?Allergen Reaction Noted  ? Amoxicillin Other (See Comments) 04/05/2012  ? ? ?Family History  ?Problem Relation Age of Onset  ? Emphysema Mother   ? Rheum arthritis Mother   ? Hypertension Father   ? Stroke Father   ? Diabetes Paternal Uncle   ? Colon cancer Neg Hx   ? Rectal cancer Neg Hx   ? Stomach cancer Neg Hx   ? Esophageal cancer Neg Hx   ? Cancer Neg Hx   ? ? ?Social History  ? ?Socioeconomic History  ? Marital status: Married  ?  Spouse name: Not on file  ? Number of children: 2  ? Years of education: Not on file  ? Highest education level: Not on file  ?Occupational History  ?  Occupation: NURSE  ?  Employer: [employer]  ?Tobacco Use  ? Smoking status: Former  ?  Packs/day: 0.50  ?  Years: 44.00  ?  Pack years: 22.00  ?  Types: Cigarettes  ?  Quit date: 10/26/2009  ?  Years since quitting: 12.3  ? Smokeless tobacco: Never  ? Tobacco comments:  ?  off and on x 44 years 03/14/13  ?Vaping Use  ? Vaping Use: Never used  ?Substance and Sexual Activity  ? Alcohol use: Yes  ?  Alcohol/week: 0.0 standard drinks  ?  Comment: 1-2 drinks a week  ? Drug use: No  ? Sexual activity: Yes  ?  Birth control/protection: Surgical  ?Other Topics Concern  ? Not on file  ?Social History Narrative  ?   ? Social:Does not smoke. Former smoker.  Social alcohol consumption. EtOH 1-2 drinks/week. 3 caffeine/day. She is married.  2 adult sons.  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   ? Family history: Sister died of breast cancer.  Father died of complications of sepsis.  He had a stroke.  Mother died of  respiratory complications and had history of rheumatoid arthritis.  1 sister living.  No brothers.  ? ? ? ? ?Review of Systems: ? ?All other review of systems negative except as mentioned in the HPI. ? ?Physical

## 2022-02-23 NOTE — Op Note (Signed)
[place] ?Patient Name: Pamela Ingram ?Procedure Date: 02/23/2022 1:48 PM ?MRN: [id number] ?Endoscopist: Gatha Mayer , MD ?Age: 71 ?Referring MD:  ?Date of Birth: 1951-08-13 ?Gender: Female ?Account #: [id number] ?Procedure:                Colonoscopy ?Indications:              Abdominal pain in the left lower quadrant, Change  ?                          in bowel habits ?Medicines:                Monitored Anesthesia Care ?Procedure:                Pre-Anesthesia Assessment: ?                          - Prior to the procedure, a History and Physical  ?                          was performed, and patient medications and  ?                          allergies were reviewed. The patient's tolerance of  ?                          previous anesthesia was also reviewed. The risks  ?                          and benefits of the procedure and the sedation  ?                          options and risks were discussed with the patient.  ?                          All questions were answered, and informed consent  ?                          was obtained. Prior Anticoagulants: The patient has  ?                          taken no previous anticoagulant or antiplatelet  ?                          agents. ASA Grade Assessment: II - A patient with  ?                          mild systemic disease. After reviewing the risks  ?                          and benefits, the patient was deemed in  ?                          satisfactory condition to undergo the procedure. ?  After obtaining informed consent, the colonoscope  ?                          was passed under direct vision. Throughout the  ?                          procedure, the patient's blood pressure, pulse, and  ?                          oxygen saturations were monitored continuously. The  ?                          Olympus PCF-[id number]) Colonoscope was  ?                          introduced through the anus and advanced to the the   ?                          terminal ileum, with identification of the  ?                          appendiceal orifice and IC valve. The colonoscopy  ?                          was performed without difficulty. The patient  ?                          tolerated the procedure well. The quality of the  ?                          bowel preparation was good. The terminal ileum,  ?                          ileocecal valve, appendiceal orifice, and rectum  ?                          were photographed. ?Scope In: 2:00:07 PM ?Scope Out: 2:17:45 PM ?Scope Withdrawal Time: 0 hours 11 minutes 41 seconds  ?Total Procedure Duration: 0 hours 17 minutes 38 seconds  ?Findings:                 The perianal and digital rectal examinations were  ?                          normal. ?                          Multiple diverticula were found in the sigmoid  ?                          colon. ?                          The terminal ileum appeared normal. Biopsies were  ?  taken with a cold forceps for histology.  ?                          Verification of patient identification for the  ?                          specimen was done. Estimated blood loss was minimal. ?                          The exam was otherwise without abnormality on  ?                          direct and retroflexion views. ?                          Biopsies for histology were taken with a cold  ?                          forceps from the ascending colon, transverse colon,  ?                          descending colon, sigmoid colon and rectum for  ?                          evaluation of microscopic colitis. ?Complications:            No immediate complications. ?Estimated Blood Loss:     Estimated blood loss was minimal. ?Impression:               - Diverticulosis in the sigmoid colon. ?                          - The examined portion of the ileum was normal.  ?                          Biopsied. ?                          - The examination was  otherwise normal on direct  ?                          and retroflexion views. ?                          - Biopsies were taken with a cold forceps from the  ?                          ascending colon, transverse colon, descending  ?                          colon, sigmoid colon and rectum for evaluation of  ?                          microscopic colitis. ?Recommendation:           - Patient has a contact number available for  ?  emergencies. The signs and symptoms of potential  ?                          delayed complications were discussed with the  ?                          patient. Return to normal activities tomorrow.  ?                          Written discharge instructions were provided to the  ?                          patient. ?                          - Resume previous diet. ?                          - Continue present medications. ?                          - Await pathology results. May need celiac testing,  ?                          upper GI eval as CT scan showed possible upper GI  ?                          problems ?                          - No recommendation at this time regarding repeat  ?                          colonoscopy. ?                          - I rxed dicyclomine 20 mg q 6 hrs prn ?Gatha Mayer, MD ?02/23/2022 2:28:21 PM ?This report has been signed electronically. ?

## 2022-02-23 NOTE — Progress Notes (Signed)
Report given to PACU, vss 

## 2022-02-23 NOTE — Progress Notes (Signed)
Called to room to assist during endoscopic procedure.  Patient ID and intended procedure confirmed with present staff. Received instructions for my participation in the procedure from the performing physician.  

## 2022-02-23 NOTE — Patient Instructions (Addendum)
I saw some diverticulosis as before - all else ok. ?I tool ileal and colonic biopsies and will let you know results and plans. ? ?YOU HAD AN ENDOSCOPIC PROCEDURE TODAY AT THE [facility]:   Refer to the procedure report that was given to you for any specific questions about what was found during the examination.  If the procedure report does not answer your questions, please call your gastroenterologist to clarify.  If you requested that your care partner not be given the details of your procedure findings, then the procedure report has been included in a sealed envelope for you to review at your convenience later. ? ?YOU SHOULD EXPECT: Some feelings of bloating in the abdomen. Passage of more gas than usual.  Walking can help get rid of the air that was put into your GI tract during the procedure and reduce the bloating. If you had a lower endoscopy (such as a colonoscopy or flexible sigmoidoscopy) you may notice spotting of blood in your stool or on the toilet paper. If you underwent a bowel prep for your procedure, you may not have a normal bowel movement for a few days. ? ?Please Note:  You might notice some irritation and congestion in your nose or some drainage.  This is from the oxygen used during your procedure.  There is no need for concern and it should clear up in a day or so. ? ?SYMPTOMS TO REPORT IMMEDIATELY: ? ?Following lower endoscopy (colonoscopy or flexible sigmoidoscopy): ? Excessive amounts of blood in the stool ? Significant tenderness or worsening of abdominal pains ? Swelling of the abdomen that is new, acute ? Fever of 100?F or higher ? ?For urgent or emergent issues, a gastroenterologist can be reached at any hour by calling [phone]. ?Do not use MyChart messaging for urgent concerns.  ? ? ?DIET:  We do recommend a small meal at first, but then you may proceed to your regular diet.  Drink plenty of fluids but you should avoid alcoholic beverages for 24 hours. ? ?ACTIVITY:   You should plan to take it easy for the rest of today and you should NOT DRIVE or use heavy machinery until tomorrow (because of the sedation medicines used during the test).   ? ?FOLLOW UP: ?Our staff will call the number listed on your records 48-72 hours following your procedure to check on you and address any questions or concerns that you may have regarding the information given to you following your procedure. If we do not reach you, we will leave a message.  We will attempt to reach you two times.  During this call, we will ask if you have developed any symptoms of COVID 19. If you develop any symptoms (ie: fever, flu-like symptoms, shortness of breath, cough etc.) before then, please call [phone].  If you test positive for Covid 19 in the 2 weeks post procedure, please call and report this information to [place].   ? ?If any biopsies were taken you will be contacted by phone or by letter within the next 1-3 weeks.  Please call us at [phone] if you have not heard about the biopsies in 3 weeks.  ? ? ?SIGNATURES/CONFIDENTIALITY: ?You and/or your care partner have signed paperwork which will be entered into your electronic medical record.  These signatures attest to the fact that that the information above on your After Visit Summary has been reviewed and is understood.  Full responsibility of the confidentiality of this discharge information lies  with you and/or your care-partner.  ?

## 2022-02-25 ENCOUNTER — Telehealth: Payer: Self-pay | Admitting: *Deleted

## 2022-02-25 NOTE — Telephone Encounter (Signed)
?  Follow up Call- ? ? ?  02/23/2022  ?  1:09 PM  ?Call back number  ?Post procedure Call Back phone  # [phone]  ?Permission to leave phone message Yes  ?  ? ?Patient questions: ? ?Do you have a fever, pain , or abdominal swelling? No. ?Pain Score  0 * ? ?Have you tolerated food without any problems? Yes.   ? ?Have you been able to return to your normal activities? Yes.   ? ?Do you have any questions about your discharge instructions: ?Diet   No. ?Medications  No. ?Follow up visit  No. ? ?Do you have questions or concerns about your Care? No. ? ?Actions: ?* If pain score is 4 or above: ?No action needed, pain <4. ? ? ?

## 2022-02-27 DIAGNOSIS — H04123 Dry eye syndrome of bilateral lacrimal glands: Secondary | ICD-10-CM | POA: Diagnosis not present

## 2022-02-27 DIAGNOSIS — H25813 Combined forms of age-related cataract, bilateral: Secondary | ICD-10-CM | POA: Diagnosis not present

## 2022-02-27 DIAGNOSIS — H524 Presbyopia: Secondary | ICD-10-CM | POA: Diagnosis not present

## 2022-02-27 DIAGNOSIS — H35363 Drusen (degenerative) of macula, bilateral: Secondary | ICD-10-CM | POA: Diagnosis not present

## 2022-03-03 ENCOUNTER — Other Ambulatory Visit (HOSPITAL_COMMUNITY): Payer: Self-pay

## 2022-03-03 ENCOUNTER — Other Ambulatory Visit (HOSPITAL_BASED_OUTPATIENT_CLINIC_OR_DEPARTMENT_OTHER): Payer: Self-pay

## 2022-03-03 ENCOUNTER — Other Ambulatory Visit: Payer: Self-pay | Admitting: Internal Medicine

## 2022-03-03 MED ORDER — LEVOTHYROXINE SODIUM 75 MCG PO TABS
ORAL_TABLET | Freq: Every day | ORAL | 1 refills | Status: DC
  Filled 2022-03-03: qty 90, 90d supply, fill #0
  Filled 2022-05-05 – 2022-06-05 (×3): qty 90, 90d supply, fill #1

## 2022-03-13 ENCOUNTER — Other Ambulatory Visit (HOSPITAL_COMMUNITY): Payer: Self-pay

## 2022-03-16 ENCOUNTER — Other Ambulatory Visit (HOSPITAL_BASED_OUTPATIENT_CLINIC_OR_DEPARTMENT_OTHER): Payer: Self-pay

## 2022-03-30 ENCOUNTER — Other Ambulatory Visit (HOSPITAL_COMMUNITY): Payer: Self-pay

## 2022-03-31 ENCOUNTER — Other Ambulatory Visit (HOSPITAL_BASED_OUTPATIENT_CLINIC_OR_DEPARTMENT_OTHER): Payer: Self-pay

## 2022-04-01 ENCOUNTER — Other Ambulatory Visit (HOSPITAL_COMMUNITY): Payer: Self-pay

## 2022-04-02 ENCOUNTER — Other Ambulatory Visit (HOSPITAL_COMMUNITY): Payer: Self-pay

## 2022-04-09 ENCOUNTER — Other Ambulatory Visit (HOSPITAL_COMMUNITY): Payer: Self-pay

## 2022-04-17 ENCOUNTER — Other Ambulatory Visit (HOSPITAL_BASED_OUTPATIENT_CLINIC_OR_DEPARTMENT_OTHER): Payer: Self-pay

## 2022-04-20 ENCOUNTER — Other Ambulatory Visit (HOSPITAL_BASED_OUTPATIENT_CLINIC_OR_DEPARTMENT_OTHER): Payer: Self-pay

## 2022-04-20 MED ORDER — ALPRAZOLAM 0.5 MG PO TABS
ORAL_TABLET | ORAL | 0 refills | Status: DC
  Filled 2022-04-20: qty 30, 30d supply, fill #0
  Filled 2022-09-01: qty 30, 30d supply, fill #1

## 2022-04-30 ENCOUNTER — Other Ambulatory Visit (HOSPITAL_COMMUNITY): Payer: Self-pay

## 2022-05-01 ENCOUNTER — Other Ambulatory Visit (HOSPITAL_BASED_OUTPATIENT_CLINIC_OR_DEPARTMENT_OTHER): Payer: Self-pay

## 2022-05-01 DIAGNOSIS — F411 Generalized anxiety disorder: Secondary | ICD-10-CM | POA: Diagnosis not present

## 2022-05-01 MED ORDER — SERTRALINE HCL 100 MG PO TABS
ORAL_TABLET | ORAL | 1 refills | Status: DC
  Filled 2022-05-21: qty 135, 90d supply, fill #0
  Filled 2022-09-14: qty 135, 90d supply, fill #1

## 2022-05-01 MED ORDER — ALPRAZOLAM 0.5 MG PO TABS: ORAL_TABLET | ORAL | 0 refills | Status: DC

## 2022-05-05 ENCOUNTER — Telehealth: Payer: Self-pay | Admitting: Internal Medicine

## 2022-05-05 ENCOUNTER — Other Ambulatory Visit (HOSPITAL_BASED_OUTPATIENT_CLINIC_OR_DEPARTMENT_OTHER): Payer: Self-pay

## 2022-05-05 NOTE — Telephone Encounter (Signed)
Faxed manufacturer medication change for levothyroxine to below pharmacy.   [facility] Phone:  [phone]  Fax:  [phone]

## 2022-05-05 NOTE — Telephone Encounter (Signed)
This message was sent via FAXCOM, a product from [employer]. http://www.biscom.com/                    -------Fax Transmission Report-------  To:               Recipient at [phone] Subject:          FW: Hp Scans Result:           The transmission was successful. Explanation:      All Pages Ok Pages Sent:       2 Connect Time:     1 minutes, 34 seconds Transmit Time:    05/05/2022 12:54 Transfer Rate:    14400 Status Code:      0000 Retry Count:      0 Job Id:           [id number] Unique Id:        [id number] Fax Line:         39 Fax Server:       [id number]

## 2022-05-07 ENCOUNTER — Other Ambulatory Visit (HOSPITAL_COMMUNITY): Payer: Self-pay

## 2022-05-21 ENCOUNTER — Other Ambulatory Visit (HOSPITAL_BASED_OUTPATIENT_CLINIC_OR_DEPARTMENT_OTHER): Payer: Self-pay

## 2022-05-27 ENCOUNTER — Other Ambulatory Visit (HOSPITAL_COMMUNITY): Payer: Self-pay

## 2022-05-28 ENCOUNTER — Other Ambulatory Visit (HOSPITAL_COMMUNITY): Payer: Self-pay

## 2022-05-29 ENCOUNTER — Other Ambulatory Visit (HOSPITAL_BASED_OUTPATIENT_CLINIC_OR_DEPARTMENT_OTHER): Payer: Self-pay

## 2022-06-05 ENCOUNTER — Other Ambulatory Visit (HOSPITAL_BASED_OUTPATIENT_CLINIC_OR_DEPARTMENT_OTHER): Payer: Self-pay

## 2022-06-08 ENCOUNTER — Other Ambulatory Visit (HOSPITAL_COMMUNITY): Payer: Self-pay

## 2022-06-08 ENCOUNTER — Other Ambulatory Visit (HOSPITAL_BASED_OUTPATIENT_CLINIC_OR_DEPARTMENT_OTHER): Payer: Self-pay

## 2022-06-11 ENCOUNTER — Other Ambulatory Visit: Payer: Medicare PPO

## 2022-06-11 DIAGNOSIS — E7800 Pure hypercholesterolemia, unspecified: Secondary | ICD-10-CM

## 2022-06-12 ENCOUNTER — Encounter: Payer: Self-pay | Admitting: Internal Medicine

## 2022-06-12 ENCOUNTER — Ambulatory Visit: Payer: Medicare PPO | Admitting: Internal Medicine

## 2022-06-12 ENCOUNTER — Telehealth (HOSPITAL_BASED_OUTPATIENT_CLINIC_OR_DEPARTMENT_OTHER): Payer: Self-pay

## 2022-06-12 VITALS — BP 112/70 | HR 72 | Temp 98.2°F | Wt 156.0 lb

## 2022-06-12 DIAGNOSIS — M05741 Rheumatoid arthritis with rheumatoid factor of right hand without organ or systems involvement: Secondary | ICD-10-CM

## 2022-06-12 DIAGNOSIS — E7800 Pure hypercholesterolemia, unspecified: Secondary | ICD-10-CM | POA: Diagnosis not present

## 2022-06-12 DIAGNOSIS — E039 Hypothyroidism, unspecified: Secondary | ICD-10-CM | POA: Diagnosis not present

## 2022-06-12 DIAGNOSIS — Z8249 Family history of ischemic heart disease and other diseases of the circulatory system: Secondary | ICD-10-CM

## 2022-06-12 DIAGNOSIS — M05742 Rheumatoid arthritis with rheumatoid factor of left hand without organ or systems involvement: Secondary | ICD-10-CM

## 2022-06-12 DIAGNOSIS — F4321 Adjustment disorder with depressed mood: Secondary | ICD-10-CM | POA: Diagnosis not present

## 2022-06-12 DIAGNOSIS — K580 Irritable bowel syndrome with diarrhea: Secondary | ICD-10-CM | POA: Diagnosis not present

## 2022-06-12 LAB — LIPID PANEL
Cholesterol: 239 mg/dL — ABNORMAL HIGH (ref <200)
HDL: 86 mg/dL (ref >=50)
LDL Cholesterol (Calc): 138 mg/dL (calc) — ABNORMAL HIGH
Non-HDL Cholesterol (Calc): 153 mg/dL (calc) — ABNORMAL HIGH (ref <130)
Total CHOL/HDL Ratio: 2.8 (calc) (ref <5.0)
Triglycerides: 55 mg/dL (ref <150)

## 2022-06-12 LAB — HEPATIC FUNCTION PANEL
AG Ratio: 2 (calc) (ref 1.0–2.5)
ALT: 22 U/L (ref 6–29)
AST: 25 U/L (ref 10–35)
Albumin: 4.6 g/dL (ref 3.6–5.1)
Alkaline phosphatase (APISO): 58 U/L (ref 37–153)
Bilirubin, Direct: 0.1 mg/dL (ref 0.0–0.2)
Globulin: 2.3 g/dL (calc) (ref 1.9–3.7)
Indirect Bilirubin: 0.4 mg/dL (calc) (ref 0.2–1.2)
Total Bilirubin: 0.5 mg/dL (ref 0.2–1.2)
Total Protein: 6.9 g/dL (ref 6.1–8.1)

## 2022-06-12 NOTE — Progress Notes (Signed)
6 month follow up  Subjective:    Patient ID: Pamela Ingram, female    DOB: [birth year], 71 y.o.   MRN: [id number]  HPI  71 year old Female seen for 6 month follow up. Admits to some dietary indiscretion recently.  Lipids have not substantially changed since February.  Total cholesterol is now 239 and it was 235 in February.  LDL is 138 and was 136 in February.  Triglycerides have improved slightly from February at which time they were 83 and are now 52.  HDL remains in the 80s.  Liver functions are normal.  History of hypothyroidism treated with levothyroxine.  TSH not checked with this visit.  She is on Zoloft 150 mg every morning.  She is on Zocor 20 mg daily.  She is on [place].  Takes Xanax sparingly for anxiety.  Takes Zoloft for anxiety/depression per Ellis Savage.  Her current rheumatologist is in [place] associated with [facility].  Sees [facility]/Lisa Lance Coon for anxiety and depression.  Longstanding history of rheumatoid arthritis.  She had a normal cardiac catheterization in May 2018.  Feels her depression has improved and is stable.    Review of Systems no new complaints     Objective:   Physical Exam Blood pressure excellent at 112/70 left arm pulse 72 regular temperature 98.2 degrees pulse oximetry 98% weight 156 pounds BMI 27.6 Skin: Warm and dry.  Nodes none.  Neck supple.  No thyromegaly or carotid bruits.  Chest clear.  Cardiac exam: Regular rate and rhythm without ectopy.  No lower extremity pitting edema.      Assessment & Plan:  Anxiety and depression stable on SSRI and Xanax.  Continue follow-up with Ellis Savage.  Hyperlipidemia-continue with same dose of lipid-lowering medication but watch diet  Rheumatoid arthritis seen by rheumatologist in [place] and takes DMARD [facility] [place])  History of irritable bowel syndrome treated with Bentyl as needed  Hypothyroidism treated with levothyroxine 75 mcg daily  Plan: See above recommendations and return for  health maintenance exam and Medicare wellness visit in 6 months.

## 2022-06-17 ENCOUNTER — Other Ambulatory Visit (HOSPITAL_BASED_OUTPATIENT_CLINIC_OR_DEPARTMENT_OTHER): Payer: Medicare PPO

## 2022-06-21 NOTE — Patient Instructions (Signed)
Is a pleasure to see you today.  Continue current medications and follow-up for Medicare wellness, health maintenance exam and fasting lab studies in 6 months.  Vaccines discussed.

## 2022-06-26 ENCOUNTER — Other Ambulatory Visit (HOSPITAL_COMMUNITY): Payer: Self-pay

## 2022-06-30 ENCOUNTER — Other Ambulatory Visit (HOSPITAL_COMMUNITY): Payer: Self-pay

## 2022-06-30 ENCOUNTER — Ambulatory Visit (HOSPITAL_BASED_OUTPATIENT_CLINIC_OR_DEPARTMENT_OTHER)
Admission: RE | Admit: 2022-06-30 | Discharge: 2022-06-30 | Disposition: A | Discharge status: Home / Self Care | Payer: Medicare PPO | Source: Ambulatory Visit | Attending: Internal Medicine | Admitting: Internal Medicine

## 2022-06-30 DIAGNOSIS — Z8249 Family history of ischemic heart disease and other diseases of the circulatory system: Secondary | ICD-10-CM | POA: Insufficient documentation

## 2022-07-02 ENCOUNTER — Other Ambulatory Visit (HOSPITAL_COMMUNITY): Payer: Self-pay

## 2022-07-09 ENCOUNTER — Other Ambulatory Visit (HOSPITAL_BASED_OUTPATIENT_CLINIC_OR_DEPARTMENT_OTHER): Payer: Self-pay

## 2022-07-17 ENCOUNTER — Other Ambulatory Visit (HOSPITAL_COMMUNITY): Payer: Self-pay

## 2022-07-20 ENCOUNTER — Other Ambulatory Visit (HOSPITAL_COMMUNITY): Payer: Self-pay

## 2022-07-20 DIAGNOSIS — M0579 Rheumatoid arthritis with rheumatoid factor of multiple sites without organ or systems involvement: Secondary | ICD-10-CM | POA: Diagnosis not present

## 2022-07-20 DIAGNOSIS — Z79899 Other long term (current) drug therapy: Secondary | ICD-10-CM | POA: Diagnosis not present

## 2022-07-21 ENCOUNTER — Ambulatory Visit: Payer: Medicare PPO | Admitting: Pulmonary Disease

## 2022-07-21 ENCOUNTER — Encounter: Payer: Self-pay | Admitting: Pulmonary Disease

## 2022-07-21 VITALS — BP 116/74 | HR 63 | Temp 98.3°F | Ht 63.0 in | Wt 161.0 lb

## 2022-07-21 DIAGNOSIS — R9389 Abnormal findings on diagnostic imaging of other specified body structures: Secondary | ICD-10-CM

## 2022-07-21 NOTE — Progress Notes (Signed)
Pamela Ingram    SG:[id number]    December 05, 1950  Primary Care Physician:Baxley, Cresenciano Lick, MD  Referring Physician: Elby Showers, MD [address] [place],  [place] [address]  Chief complaint:   Patient with shortness of breath on significant exertion and sometimes at rest  HPI:  Shortness of breath happens intermittently May happen usually when she is sitting not doing anything Able to stay active without significant shortness of breath with activity  Rarely may get short of breath with activity but not a concern  Occasional use of Xanax for anxiety  History of rheumatoid arthritis for which she is on Actemra, previous bout with pleurisy  Reformed smoker with family history of obstructive lung disease  Reformed smoker quit many years ago Never diagnosed with any lung disease Had PFT in 2014 that was within normal limits  No history of asthma growing up  Outpatient Encounter Medications as of 07/21/2022  Medication Sig   ALPRAZolam (XANAX) 0.5 MG tablet Take 1 tablet by mouth every morning as needed   Apremilast (OTEZLA) 30 MG TABS Take 1 tablet (30 mg total) by mouth 2 (two) times daily.   dicyclomine (BENTYL) 20 MG tablet Take 1 tablet (20 mg total) by mouth every 6 (six) hours as needed for spasms (abdominal pain).   hydrOXYzine (ATARAX) 10 MG tablet Take 1 - 2 tablets by mouth at dinner and/or bedtime as needed for itching. **Watch for sedation**   ibuprofen (ADVIL,MOTRIN) 200 MG tablet Take 800 mg by mouth every 8 (eight) hours as needed for moderate pain.    levothyroxine (SYNTHROID) 75 MCG tablet TAKE 1 TABLET BY MOUTH ONCE DAILY   sertraline (ZOLOFT) 100 MG tablet Take 1 & 1/2  tablets by mouth in morning.   simvastatin (ZOCOR) 20 MG tablet TAKE 1 TABLET BY MOUTH EVERY EVENING   Tocilizumab (ACTEMRA) 162 MG/0.9ML SOSY Inject 0.9 ml (1 syringe) subcutaneously every week   [DISCONTINUED] ALPRAZolam (XANAX) 0.5 MG tablet Take 1 Q AM as needed    [DISCONTINUED] ALPRAZolam (XANAX) 0.5 MG tablet Take 1 tablet by mouth every morning as needed   [DISCONTINUED] ALPRAZolam (XANAX) 0.5 MG tablet Take 1 tablet by mouth every morning as needed   [DISCONTINUED] Apremilast (OTEZLA) 30 MG TABS Take 1 tablet (30 mg total) by mouth 2 (two) times daily   [DISCONTINUED] tretinoin (RETIN-A) 0.05 % cream Apply nightly only as tolerated. Do not get on or around your eyes   No facility-administered encounter medications on file as of 07/21/2022.    Allergies as of 07/21/2022 - Review Complete 07/21/2022  Allergen Reaction Noted   Amoxicillin Other (See Comments) 04/05/2012    Past Medical History:  Diagnosis Date   Anxiety    Hyperlipidemia    Pleurisy with effusion    Rheumatoid arthritis(714.0)    Thyroid disease    hypothyroidism    Past Surgical History:  Procedure Laterality Date   COLONOSCOPY     LAPAROSCOPY     LEFT HEART CATH AND CORONARY ANGIOGRAPHY N/A 06/04/2017   Procedure: Left Heart Cath and Coronary Angiography;  Surgeon: Sherren Mocha, MD;  Location: [facility];  Service: Cardiovascular;  Laterality: N/A;   TUBAL LIGATION     VAGINAL HYSTERECTOMY  1995    Family History  Problem Relation Age of Onset   Emphysema Mother    Rheum arthritis Mother    Hypertension Father    Stroke Father    Diabetes Paternal Uncle  Colon cancer Neg Hx    Rectal cancer Neg Hx    Stomach cancer Neg Hx    Esophageal cancer Neg Hx    Cancer Neg Hx     Social History   Socioeconomic History   Marital status: Married    Spouse name: Not on file   Number of children: 2   Years of education: Not on file   Highest education level: Not on file  Occupational History   Occupation: NURSE    Employer: [employer]  Tobacco Use   Smoking status: Former    Packs/day: 0.50    Years: 44.00    Total pack years: 22.00    Types: Cigarettes    Quit date: 10/26/2009    Years since quitting: 12.7   Smokeless tobacco: Never   Tobacco  comments:    off and on x 44 years 03/14/13  Vaping Use   Vaping Use: Never used  Substance and Sexual Activity   Alcohol use: Yes    Alcohol/week: 0.0 standard drinks of alcohol    Comment: 1-2 drinks a week   Drug use: No   Sexual activity: Yes    Birth control/protection: Surgical  Other Topics Concern   Not on file  Social History Narrative      Social:Does not smoke. Former smoker.  Social alcohol consumption. EtOH 1-2 drinks/week. 3 caffeine/day. She is married.  2 adult sons.  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  Father died of complications of sepsis.  He had a stroke.  Mother died of respiratory complications and had history of rheumatoid arthritis.  1 sister living.  No brothers.   Social Determinants of Health   Financial Resource Strain: Not on file  Food Insecurity: Not on file  Transportation Needs: Not on file  Physical Activity: Not on file  Stress: Not on file  Social Connections: Not on file  Intimate Partner Violence: Not on file    Review of Systems  Constitutional: Negative.   HENT: Negative.    Eyes: Negative.   Respiratory:  Positive for shortness of breath. Negative for cough.   Cardiovascular: Negative.   Endocrine: Negative.     Vitals:   07/21/22 1553  BP: 116/74  Pulse: 63  Temp: 98.3 F (36.8 C)  SpO2: 99%     Physical Exam Constitutional:      Appearance: Normal appearance. She is well-developed.  HENT:     Head: Normocephalic and atraumatic.  Eyes:     General:        Right eye: No discharge.     Conjunctiva/sclera: Conjunctivae normal.     Pupils: Pupils are equal, round, and reactive to light.  Neck:     Thyroid: No thyromegaly.     Trachea: No tracheal deviation.  Cardiovascular:     Rate and Rhythm: Normal rate and regular rhythm.  Pulmonary:     Effort: Pulmonary effort is normal. No respiratory distress.      Breath sounds: Normal breath sounds. No wheezing or rales.  Chest:     Chest wall: No tenderness.  Musculoskeletal:     Cervical back: Normal range of motion and neck supple.  Skin:    General: Skin is warm and dry.     Findings: No erythema.  Neurological:     Mental Status: She is alert and oriented to person, place, and time.  Data Reviewed: PFT from 2014 was within normal limits with no obstruction, no restriction Chest x-ray from October 26, 2019-no infiltrative process  CT scan-cardiac CT did show some scarring in the lingula  Assessment: Shortness of breath -Able to tolerate activities -No significant symptoms with activities -May be anxiety related  Anxiety -As needed medications  Plan/Recommendations: Continue to use Xanax as needed  Your CT scans were reviewed, the findings in the right midlung may be related to the pleurisy you had previously -I do not believe any other testing needs done at present   Graded activities as tolerated  I will see you a year from now  Sherrilyn Rist MD [facility] 07/21/2022, 4:02 PM  CC: Elby Showers, MD

## 2022-07-21 NOTE — Patient Instructions (Signed)
I will see you a year from now  Call with significant concerns  The haziness/scarring mentioned on the CAT scan can be traced back to when you had pleurisy previously  No indication for any further testing  Medications for anxiety as needed  Continue graded activities as tolerated

## 2022-07-30 ENCOUNTER — Other Ambulatory Visit (HOSPITAL_COMMUNITY): Payer: Self-pay

## 2022-08-18 ENCOUNTER — Telehealth: Payer: Medicare PPO | Admitting: Nurse Practitioner

## 2022-08-18 ENCOUNTER — Other Ambulatory Visit (HOSPITAL_BASED_OUTPATIENT_CLINIC_OR_DEPARTMENT_OTHER): Payer: Self-pay

## 2022-08-18 DIAGNOSIS — U071 COVID-19: Secondary | ICD-10-CM

## 2022-08-18 MED ORDER — NIRMATRELVIR/RITONAVIR (PAXLOVID)TABLET
3.0000 | ORAL_TABLET | Freq: Two times a day (BID) | ORAL | 0 refills | Status: AC
  Filled 2022-08-18: qty 30, 5d supply, fill #0

## 2022-08-18 NOTE — Progress Notes (Signed)
Virtual Visit Consent   Pamela Ingram, you are scheduled for a virtual visit with a [facility] provider today. Just as with appointments in the office, your consent must be obtained to participate. Your consent will be active for this visit and any virtual visit you may have with one of our providers in the next 365 days. If you have a MyChart account, a copy of this consent can be sent to you electronically.  As this is a virtual visit, video technology does not allow for your provider to perform a traditional examination. This may limit your provider's ability to fully assess your condition. If your provider identifies any concerns that need to be evaluated in person or the need to arrange testing (such as labs, EKG, etc.), we will make arrangements to do so. Although advances in technology are sophisticated, we cannot ensure that it will always work on either your end or our end. If the connection with a video visit is poor, the visit may have to be switched to a telephone visit. With either a video or telephone visit, we are not always able to ensure that we have a secure connection.  By engaging in this virtual visit, you consent to the provision of healthcare and authorize for your insurance to be billed (if applicable) for the services provided during this visit. Depending on your insurance coverage, you may receive a charge related to this service.  I need to obtain your verbal consent now. Are you willing to proceed with your visit today? Pamela Ingram has provided verbal consent on 08/18/2022 for a virtual visit (video or telephone). Apolonio Schneiders, FNP  Date: 08/18/2022 8:23 AM  Virtual Visit via Video Note   I, Apolonio Schneiders, connected with  Pamela Ingram  ([id number], May 10, 1951) on 08/18/22 at  8:45 AM EDT by a video-enabled telemedicine application and verified that I am speaking with the correct person using two identifiers.  Location: Patient: Virtual Visit Location Patient:  Home Provider: Virtual Visit Location Provider: Home Office   I discussed the limitations of evaluation and management by telemedicine and the availability of in person appointments. The patient expressed understanding and agreed to proceed.    History of Present Illness: Pamela Ingram is a 71 y.o. who identifies as a female who was assigned female at birth, and is being seen today after testing positive for COVID this morning.  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   Anxiety 05/29/2016   Esophageal reflux 05/29/2016   Osteopenia 47/82/9562   Lichen sclerosus et atrophicus 07/24/2015   Overweight (BMI 25.0-29.9) 03/13/2013   Rheumatoid arthritis ([place]) 08/03/2011   Rheumatoid arthritis(714.0) 10/26/2008   Hyperlipemia 10/27/2007   Hypothyroid 10/27/2003   Depression with anxiety 10/26/1993    Allergies:  Allergies  Allergen Reactions   Amoxicillin Other (See Comments)    Pt gets yeast infection Has patient had a PCN reaction causing immediate rash, facial/tongue/throat swelling, SOB or lightheadedness with hypotension: No Has patient had a PCN reaction causing severe rash involving mucus membranes or skin necrosis: No  Has patient had a PCN reaction that required hospitalization: No Has patient had a PCN reaction occurring within the last 10 years: No If all of the above answers are "NO", then may proceed with Cephalosporin use.    Medications:   Current Outpatient Medications  Medication Instructions   ALPRAZolam (XANAX) 0.5 MG tablet Take 1 tablet by mouth every morning as  needed   dicyclomine (BENTYL) 20 mg, Oral, Every 6 hours PRN   ibuprofen (ADVIL) 800 mg, Oral, Every 8 hours PRN   levothyroxine (SYNTHROID) 75 MCG tablet TAKE 1 TABLET BY MOUTH ONCE DAILY   Otezla 30 mg, Oral, 2 times daily   sertraline (ZOLOFT) 100 MG tablet Take 1 & 1/2  tablets by mouth in morning.   simvastatin (ZOCOR) 20 MG tablet TAKE 1 TABLET BY MOUTH EVERY EVENING   Tocilizumab (ACTEMRA) 162 MG/0.9ML SOSY Inject 0.9 ml (1 syringe) subcutaneously every week    Observations/Objective: Patient is well-developed, well-nourished in no acute distress.  Resting comfortably  at home.  Head is normocephalic, atraumatic.  No labored breathing.  Speech is clear and coherent with logical content.  Patient is alert and oriented at baseline.    Assessment and Plan: 1. COVID-19 Take with food  Hold Simvastatin for one week   - nirmatrelvir/ritonavir EUA (PAXLOVID) 20 x 150 MG & 10 x 100MG  TABS; Take 3 tablets by mouth 2 (two) times daily for 5 days. (Take nirmatrelvir 150 mg two tablets twice daily for 5 days and ritonavir 100 mg one tablet twice daily for 5 days) Patient GFR is 72  Dispense: 30 tablet; Refill: 0    Continue to manage symptoms with over the counter medications as discussed  Follow Up Instructions: I discussed the assessment and treatment plan with the patient. The patient was provided an opportunity to ask questions and all were answered. The patient agreed with the plan and demonstrated an understanding of the instructions.  A copy of instructions were sent to the patient via MyChart unless otherwise noted below.    The patient was advised to call back or seek an in-person evaluation if the symptoms worsen or if the condition fails to improve as anticipated.  Time:  I spent 15 minutes with the patient via telehealth technology discussing the above problems/concerns.    Apolonio Schneiders, FNP

## 2022-08-18 NOTE — Patient Instructions (Signed)
Stop Simvastatin for one week (ZOCOR) while on anti-viral management

## 2022-08-20 ENCOUNTER — Other Ambulatory Visit (HOSPITAL_COMMUNITY): Payer: Self-pay

## 2022-08-24 ENCOUNTER — Telehealth: Payer: Medicare PPO | Admitting: Physician Assistant

## 2022-08-24 DIAGNOSIS — U071 COVID-19: Secondary | ICD-10-CM | POA: Diagnosis not present

## 2022-08-24 NOTE — Patient Instructions (Signed)
Ginette Pitman, thank you for joining Margaretann Loveless, PA-C for today's virtual visit.  While this provider is not your primary care provider (PCP), if your PCP is located in our provider database this encounter information will be shared with them immediately following your visit.   A [facility] MyChart account gives you access to today's visit and all your visits, tests, and labs performed at [facility] " click here if you don't have a [facility] MyChart account or go to [email or web address]  Consent: (Patient) Pamela Ingram provided verbal consent for this virtual visit at the beginning of the encounter.  Current Medications:  Current Outpatient Medications:    ALPRAZolam (XANAX) 0.5 MG tablet, Take 1 tablet by mouth every morning as needed, Disp: 90 tablet, Rfl: 0   Apremilast (OTEZLA) 30 MG TABS, Take 1 tablet (30 mg total) by mouth 2 (two) times daily., Disp: 60 tablet, Rfl: 9   dicyclomine (BENTYL) 20 MG tablet, Take 1 tablet (20 mg total) by mouth every 6 (six) hours as needed for spasms (abdominal pain)., Disp: 90 tablet, Rfl: 0   ibuprofen (ADVIL,MOTRIN) 200 MG tablet, Take 800 mg by mouth every 8 (eight) hours as needed for moderate pain. , Disp: , Rfl:    levothyroxine (SYNTHROID) 75 MCG tablet, TAKE 1 TABLET BY MOUTH ONCE DAILY, Disp: 90 tablet, Rfl: 1   sertraline (ZOLOFT) 100 MG tablet, Take 1 & 1/2  tablets by mouth in morning., Disp: 135 tablet, Rfl: 1   simvastatin (ZOCOR) 20 MG tablet, TAKE 1 TABLET BY MOUTH EVERY EVENING, Disp: 90 tablet, Rfl: 3   Tocilizumab (ACTEMRA) 162 MG/0.9ML SOSY, Inject 0.9 ml (1 syringe) subcutaneously every week, Disp: 12 Syringe, Rfl: 3   Medications ordered in this encounter:  No orders of the defined types were placed in this encounter.    *If you need refills on other medications prior to your next appointment, please contact your pharmacy*  Follow-Up: Call back or seek an in-person evaluation if the symptoms worsen  or if the condition fails to improve as anticipated.  [facility] [phone]  Other Instructions COVID-19 COVID-19, or coronavirus disease 2019, is an infection that is caused by a new (novel) coronavirus called SARS-CoV-2. COVID-19 can cause many symptoms. In some people, the virus may not cause any symptoms. In others, it may cause mild or severe symptoms. Some people with severe infection develop severe disease. What are the causes? This illness is caused by a virus. The virus may be in the air as tiny specks of fluid (aerosols) or droplets, or it may be on surfaces. You may catch the virus by: Breathing in droplets from an infected person. Droplets can be spread by a person breathing, speaking, singing, coughing, or sneezing. Touching something, like a table or a doorknob, that has virus on it (is contaminated) and then touching your mouth, nose, or eyes. What increases the risk? Risk for infection: You are more likely to get infected with the COVID-19 virus if: You are within 6 ft (1.8 m) of a person with COVID-19 for 15 minutes or longer. You are providing care for a person who is infected with COVID-19. You are in close personal contact with other people. Close personal contact includes hugging, kissing, or sharing eating or drinking utensils. Risk for serious illness caused by COVID-19: You are more likely to get seriously ill from the COVID-19 virus if: You have cancer. You have a long-term (chronic) disease, such as: Chronic lung  disease. This includes pulmonary embolism, chronic obstructive pulmonary disease, and cystic fibrosis. Long-term disease that lowers your body's ability to fight infection (immunocompromise). Serious cardiac conditions, such as heart failure, coronary artery disease, or cardiomyopathy. Diabetes. Chronic kidney disease. Liver diseases. These include cirrhosis, nonalcoholic fatty liver disease, alcoholic liver disease, or autoimmune  hepatitis. You have obesity. You are pregnant or were recently pregnant. You have sickle cell disease. What are the signs or symptoms? Symptoms of this condition can range from mild to severe. Symptoms may appear any time from 2 to 14 days after being exposed to the virus. They include: Fever or chills. Shortness of breath or trouble breathing. Feeling tired or very tired. Headaches, body aches, or muscle aches. Runny or stuffy nose, sneezing, coughing, or sore throat. New loss of taste or smell. This is rare. Some people may also have stomach problems, such as nausea, vomiting, or diarrhea. Other people may not have any symptoms of COVID-19. How is this diagnosed? This condition may be diagnosed by testing samples to check for the COVID-19 virus. The most common tests are the PCR test and the antigen test. Tests may be done in the lab or at home. They include: Using a swab to take a sample of fluid from the back of your nose and throat (nasopharyngeal fluid), from your nose, or from your throat. Testing a sample of saliva from your mouth. Testing a sample of coughed-up mucus from your lungs (sputum). How is this treated? Treatment for COVID-19 infection depends on the severity of the condition. Mild symptoms can be managed at home with rest, fluids, and over-the-counter medicines. Serious symptoms may be treated in a hospital intensive care unit (ICU). Treatment in the ICU may include: Supplemental oxygen. Extra oxygen is given through a tube in the nose, a face mask, or a hood. Medicines. These may include: Antivirals, such as monoclonal antibodies. These help your body fight off certain viruses that can cause disease. Anti-inflammatories, such as corticosteroids. These reduce inflammation and suppress the immune system. Antithrombotics. These prevent or treat blood clots, if they develop. Convalescent plasma. This helps boost your immune system, if you have an underlying  immunosuppressive condition or are getting immunosuppressive treatments. Prone positioning. This means you will lie on your stomach. This helps oxygen to get into your lungs. Infection control measures. If you are at risk for more serious illness caused by COVID-19, your health care provider may prescribe two long-acting monoclonal antibodies, given together every 6 months. How is this prevented? To protect yourself: Use preventive medicine (pre-exposure prophylaxis). You may get pre-exposure prophylaxis if you have moderate or severe immunocompromise. Get vaccinated. Anyone 37 months old or older who meets guidelines can get a COVID-19 vaccine or vaccine series. This includes people who are pregnant or making breast milk (lactating). Get an added dose of COVID-19 vaccine after your first vaccine or vaccine series if you have moderate to severe immunocompromise. This applies if you have had a solid organ transplant or have been diagnosed with an immunocompromising condition. You should get the added dose 4 weeks after you got the first COVID-19 vaccine or vaccine series. If you get an mRNA vaccine, you will need a 3-dose primary series. If you get the J&J/Janssen vaccine, you will need a 2-dose primary series, with the second dose being an mRNA vaccine. Talk to your health care provider about getting experimental monoclonal antibodies. This treatment is approved under emergency use authorization to prevent severe illness before or after being exposed to  the COVID-19 virus. You may be given monoclonal antibodies if: You have moderate or severe immunocompromise. This includes treatments that lower your immune response. People with immunocompromise may not develop protection against COVID-19 when they are vaccinated. You cannot be vaccinated. You may not get a vaccine if you have a severe allergic reaction to the vaccine or its components. You are not fully vaccinated. You are in a facility where  COVID-19 is present and: Are in close contact with a person who is infected with the COVID-19 virus. Are at high risk of being exposed to the COVID-19 virus. You are at risk of illness from new variants of the COVID-19 virus. To protect others: If you have symptoms of COVID-19, take steps to prevent the virus from spreading to others. Stay home. Leave your house only to get medical care. Do not use public transit, if possible. Do not travel while you are sick. Wash your hands often with soap and water for at least 20 seconds. If soap and water are not available, use alcohol-based hand sanitizer. Make sure that all people in your household wash their hands well and often. Cough or sneeze into a tissue or your sleeve or elbow. Do not cough or sneeze into your hand or into the air. Where to find more information Centers for Disease Control and Prevention: https://www.clark-whitaker.org/ World Health Organization: https://thompson-craig.com/ Get help right away if: You have trouble breathing. You have pain or pressure in your chest. You are confused. You have bluish lips and fingernails. You have trouble waking from sleep. You have symptoms that get worse. These symptoms may be an emergency. Get help right away. Call 911. Do not wait to see if the symptoms will go away. Do not drive yourself to the hospital. Summary COVID-19 is an infection that is caused by a new coronavirus. Sometimes, there are no symptoms. Other times, symptoms range from mild to severe. Some people with a severe COVID-19 infection develop severe disease. The virus that causes COVID-19 can spread from person to person through droplets or aerosols from breathing, speaking, singing, coughing, or sneezing. Mild symptoms of COVID-19 can be managed at home with rest, fluids, and over-the-counter medicines. This information is not intended to replace advice given to you by your health care provider. Make sure you discuss any  questions you have with your health care provider. Document Revised: 09/30/2021 Document Reviewed: 10/02/2021 Elsevier Patient Education  2023 Elsevier Inc.    If you have been instructed to have an in-person evaluation today at a local Urgent Care facility, please use the link below. It will take you to a list of all of our available [facility], including address, phone number and hours of operation. Please do not delay care.  [facility]  If you or a family member do not have a primary care provider, use the link below to schedule a visit and establish care. When you choose a [facility] primary care physician or advanced practice provider, you gain a long-term partner in health. Find a Primary Care Provider  Learn more about [facility]'s in-office and virtual care options: [facility] - Get Care Now

## 2022-08-24 NOTE — Progress Notes (Signed)
Virtual Visit Consent   Pamela Ingram, you are scheduled for a virtual visit with a [facility] provider today. Just as with appointments in the office, your consent must be obtained to participate. Your consent will be active for this visit and any virtual visit you may have with one of our providers in the next 365 days. If you have a MyChart account, a copy of this consent can be sent to you electronically.  As this is a virtual visit, video technology does not allow for your provider to perform a traditional examination. This may limit your provider's ability to fully assess your condition. If your provider identifies any concerns that need to be evaluated in person or the need to arrange testing (such as labs, EKG, etc.), we will make arrangements to do so. Although advances in technology are sophisticated, we cannot ensure that it will always work on either your end or our end. If the connection with a video visit is poor, the visit may have to be switched to a telephone visit. With either a video or telephone visit, we are not always able to ensure that we have a secure connection.  By engaging in this virtual visit, you consent to the provision of healthcare and authorize for your insurance to be billed (if applicable) for the services provided during this visit. Depending on your insurance coverage, you may receive a charge related to this service.  I need to obtain your verbal consent now. Are you willing to proceed with your visit today? Pamela Ingram has provided verbal consent on 08/24/2022 for a virtual visit (video or telephone). Margaretann Loveless, PA-C  Date: 08/24/2022 12:48 PM  Virtual Visit via Video Note   I, Margaretann Loveless, connected with  Pamela BORELLO  ([id number], 1950/12/19) on 08/24/22 at 12:30 PM EDT by a video-enabled telemedicine application and verified that I am speaking with the correct person using two identifiers.  Location: Patient: Virtual Visit Location  Patient: Home Provider: Virtual Visit Location Provider: Home Office   I discussed the limitations of evaluation and management by telemedicine and the availability of in person appointments. The patient expressed understanding and agreed to proceed.    History of Present Illness: Pamela Ingram is a 71 y.o. who identifies as a female who was assigned female at birth, and is being seen today for possible rebound covid 19.  Was seen on 08/18/22 for Covid 19. Started Paxlovid and completed therapy on Saturday,08/22/22. She has had overall improvement in symptoms, but does continue to have a mild runny nose and productive cough. Mucus production from cough is clear. Denies any continued fevers, chills. Took an at home Covid test this morning and is still testing positive on at home test despite improvement in symptoms.   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   Angioedema of lips 10/07/2020   Rash 10/07/2020   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   Anxiety 05/29/2016   Esophageal reflux 05/29/2016   Osteopenia 05/29/2016   Lichen sclerosus et atrophicus 07/24/2015   Overweight (BMI 25.0-29.9) 03/13/2013   Rheumatoid arthritis (HCC) 08/03/2011   Rheumatoid arthritis(714.0) 10/26/2008   Hyperlipemia 10/27/2007   Hypothyroid 10/27/2003   Depression with anxiety 10/26/1993    Allergies:  Allergies  Allergen Reactions   Amoxicillin Other (See Comments)    Pt gets yeast infection Has patient had a PCN reaction causing immediate rash, facial/tongue/throat swelling, SOB or lightheadedness  with hypotension: No Has patient had a PCN reaction causing severe rash involving mucus membranes or skin necrosis: No Has patient had a PCN reaction that required hospitalization: No Has patient had a PCN reaction occurring within the last 10 years: No If all of the above answers are "NO", then may proceed with Cephalosporin use.     Medications:  Current Outpatient Medications:    ALPRAZolam (XANAX) 0.5 MG tablet, Take 1 tablet by mouth every morning as needed, Disp: 90 tablet, Rfl: 0   Apremilast (OTEZLA) 30 MG TABS, Take 1 tablet (30 mg total) by mouth 2 (two) times daily., Disp: 60 tablet, Rfl: 9   dicyclomine (BENTYL) 20 MG tablet, Take 1 tablet (20 mg total) by mouth every 6 (six) hours as needed for spasms (abdominal pain)., Disp: 90 tablet, Rfl: 0   ibuprofen (ADVIL,MOTRIN) 200 MG tablet, Take 800 mg by mouth every 8 (eight) hours as needed for moderate pain. , Disp: , Rfl:    levothyroxine (SYNTHROID) 75 MCG tablet, TAKE 1 TABLET BY MOUTH ONCE DAILY, Disp: 90 tablet, Rfl: 1   sertraline (ZOLOFT) 100 MG tablet, Take 1 & 1/2  tablets by mouth in morning., Disp: 135 tablet, Rfl: 1   simvastatin (ZOCOR) 20 MG tablet, TAKE 1 TABLET BY MOUTH EVERY EVENING, Disp: 90 tablet, Rfl: 3   Tocilizumab (ACTEMRA) 162 MG/0.9ML SOSY, Inject 0.9 ml (1 syringe) subcutaneously every week, Disp: 12 Syringe, Rfl: 3  Observations/Objective: Patient is well-developed, well-nourished in no acute distress.  Resting comfortably at home.  Head is normocephalic, atraumatic.  No labored breathing.  Speech is clear and coherent with logical content.  Patient is alert and oriented at baseline.    Assessment and Plan: 1. COVID-19  - Still testing positive on at home test despite symptom improvement - Advised could be false positives due to having a higher viral load while sick and that it can cause some to test positive for up to 14-21 days following illness and to monitor symptoms more than test - Do not suspect rebound covid at this time as symptoms are improving not worsening - Advised to mask around other through day 10 and if remaining fever-free without antipyretics and symptoms are continuing to improve can then end all isolation/quarantine protocols - Advised to not restart her RA medications until Monday 08/31/22 - Advised to  reach back out if symptoms change or worsen  Follow Up Instructions: I discussed the assessment and treatment plan with the patient. The patient was provided an opportunity to ask questions and all were answered. The patient agreed with the plan and demonstrated an understanding of the instructions.  A copy of instructions were sent to the patient via MyChart unless otherwise noted below.    The patient was advised to call back or seek an in-person evaluation if the symptoms worsen or if the condition fails to improve as anticipated.  Time:  I spent 12 minutes with the patient via telehealth technology discussing the above problems/concerns.    Mar Daring, PA-C

## 2022-08-27 ENCOUNTER — Encounter: Payer: Self-pay | Admitting: Internal Medicine

## 2022-08-27 ENCOUNTER — Telehealth (INDEPENDENT_AMBULATORY_CARE_PROVIDER_SITE_OTHER): Payer: Medicare PPO | Admitting: Internal Medicine

## 2022-08-27 ENCOUNTER — Telehealth: Payer: Self-pay | Admitting: Internal Medicine

## 2022-08-27 VITALS — Wt 165.0 lb

## 2022-08-27 DIAGNOSIS — U071 COVID-19: Secondary | ICD-10-CM | POA: Diagnosis not present

## 2022-08-27 DIAGNOSIS — M05742 Rheumatoid arthritis with rheumatoid factor of left hand without organ or systems involvement: Secondary | ICD-10-CM | POA: Diagnosis not present

## 2022-08-27 DIAGNOSIS — R058 Other specified cough: Secondary | ICD-10-CM

## 2022-08-27 DIAGNOSIS — M05741 Rheumatoid arthritis with rheumatoid factor of right hand without organ or systems involvement: Secondary | ICD-10-CM

## 2022-08-27 NOTE — Progress Notes (Signed)
   Subjective:    Patient ID: Pamela Ingram, female    DOB: [birth year], 71 y.o.   MRN: [id number]  HPI 71 year old Female seen today by interactive audio and video telecommunications.  She is identified using 2 identifiers as Pamela Ingram, a patient in this practice.  She is at her home and I am at my office.  She is agreeable to visit in this format today.  Patient tested positive for COVID-19 and was seen by virtual visit with Apolonio Schneiders, nurse practitioner on October 24.  Patient has a history of rheumatoid arthritis and hypothyroidism.  She also has hyperlipidemia.  She is on [place] for rheumatoid arthritis and Zocor for hyperlipidemia.  She takes Zoloft 150 mg daily and takes thyroid replacement medication.  She takes Xanax as needed for anxiety.  She sees Noemi Chapel for anxiety and depression.  Her Rheumatologist is associated with [facility] in [place].  I records indicate she has not had a recent COVID booster.  She is also due for flu vaccine.  She should consider pneumococcal 20 vaccine and RSV vaccine as well after she recovers from [place] in a few weeks.  Her tetanus immunization is up-to-date.  Patient reportedly developed symptoms on October 23.  Tested positive on October 24.  Was advised to hold simvastatin for 1 week and was treated with [facility] nurse practitioner.  At that time she was having headache, myalgias and pressure in her ears.  Patient had another virtual visit on October 30 with physician assistant.  Reported a mild runny nose and productive cough.  Patient is concerned about developing pneumonia.  Patient was also concerned because she was still testing positive on October 24 which is not unusual in [place] reassured her.  Patient called today saying she had a loose productive cough and was having clear nasal discharge with occasional bloody mucus from time to time.  Her back and chest was hurting and she was worried about pneumonia.  She has no fever.    Review  of Systems see above no shaking chills, fever, nausea or vomiting.     Objective:   Physical Exam  We connected via interactive audio and video telecommunications but I could not get a clear look at her face due to technical difficulties.  We continued with audio alone.  She is not tachypneic.  She is in no acute distress.  She is not heard to be coughing on this interview.  Her main concern is that she is afraid she will develop pneumonia.  She has a history of rheumatoid arthritis which is being treated with [place] (DMARD).      Assessment & Plan:   Patient recovering from mild case of COVID-19.  She has no fever or chills.  She is concerned about her cough and developing pneumonia however sputum is clear and not productive which is a good sign.  I think we should write this out a few more days.  Offered her cough preparation but she declines.  If she is not getting better next week, I would like for her to recontact the office.  She does not have a pulse oximetry and I encouraged her to purchase one.  She should walk some to prevent atelectasis, monitor her pulse oximetry and stay well-hydrated.

## 2022-08-27 NOTE — Telephone Encounter (Signed)
Pamela Ingram [phone]  Shayley called to say she has a loose productive cough left over from having COVID, she is also blowing out clear and bloody mucus from time to time, chest and back hurts, she is worried about getting Pneumonia. She had done home test on 08/17/2022 and had COVID, so she done MyChart Video visit and got Paxlovid, looks like she also done another MyChart Video visit on 08/24/2022. She said she did not think you would see her with COVID, I did let her now we would have done a video visit as well and treated her, for future reference.

## 2022-08-27 NOTE — Telephone Encounter (Signed)
scheduled

## 2022-08-27 NOTE — Patient Instructions (Signed)
Contact us next week if cough is still persistent.  Walk some to prevent atelectasis and monitor pulse oximetry.  Stay well-hydrated.

## 2022-08-28 ENCOUNTER — Other Ambulatory Visit (HOSPITAL_COMMUNITY): Payer: Self-pay

## 2022-09-01 ENCOUNTER — Other Ambulatory Visit (HOSPITAL_BASED_OUTPATIENT_CLINIC_OR_DEPARTMENT_OTHER): Payer: Self-pay

## 2022-09-11 ENCOUNTER — Other Ambulatory Visit (HOSPITAL_BASED_OUTPATIENT_CLINIC_OR_DEPARTMENT_OTHER): Payer: Self-pay

## 2022-09-11 MED ORDER — FLUAD QUADRIVALENT 0.5 ML IM PRSY
PREFILLED_SYRINGE | INTRAMUSCULAR | 0 refills | Status: DC
  Filled 2022-09-11: qty 0.5, 1d supply, fill #0

## 2022-09-14 ENCOUNTER — Other Ambulatory Visit: Payer: Self-pay | Admitting: Internal Medicine

## 2022-09-14 ENCOUNTER — Other Ambulatory Visit (HOSPITAL_BASED_OUTPATIENT_CLINIC_OR_DEPARTMENT_OTHER): Payer: Self-pay

## 2022-09-14 MED ORDER — LEVOTHYROXINE SODIUM 75 MCG PO TABS
75.0000 ug | ORAL_TABLET | Freq: Every day | ORAL | 1 refills | Status: DC
  Filled 2022-09-14: qty 90, 90d supply, fill #0
  Filled 2022-11-04 – 2022-11-13 (×2): qty 90, 90d supply, fill #1
  Filled 2022-12-10: qty 30, 30d supply, fill #1
  Filled 2023-01-12: qty 30, 30d supply, fill #2
  Filled 2023-02-16: qty 30, 30d supply, fill #3

## 2022-09-22 ENCOUNTER — Other Ambulatory Visit (HOSPITAL_COMMUNITY): Payer: Self-pay

## 2022-09-29 ENCOUNTER — Other Ambulatory Visit (HOSPITAL_COMMUNITY): Payer: Self-pay

## 2022-10-05 ENCOUNTER — Other Ambulatory Visit (HOSPITAL_BASED_OUTPATIENT_CLINIC_OR_DEPARTMENT_OTHER): Payer: Self-pay

## 2022-10-06 ENCOUNTER — Telehealth: Payer: Self-pay | Admitting: Internal Medicine

## 2022-10-06 ENCOUNTER — Other Ambulatory Visit: Payer: Self-pay

## 2022-10-06 DIAGNOSIS — R195 Other fecal abnormalities: Secondary | ICD-10-CM

## 2022-10-06 NOTE — Addendum Note (Signed)
Addended by: Mary Sella D on: 10/06/2022 03:36 PM   Modules accepted: Orders

## 2022-10-06 NOTE — Addendum Note (Signed)
Addended by: Mary Sella D on: 10/06/2022 01:03 PM   Modules accepted: Orders

## 2022-10-06 NOTE — Telephone Encounter (Signed)
Called patient to let her know to come and pick up stool sample cards. Let her know to go on clear liquids, no milk products for 24-48 hours and if watery stool stops can go back to regular diet. This labs will take several days to process.

## 2022-10-06 NOTE — Telephone Encounter (Signed)
Pamela Ingram [phone]  Pamela Ingram has been having Loose watery stool for a month since she ate off of a buffet bar, she would like to come in and see what she needs to do to get this under control.

## 2022-10-07 ENCOUNTER — Other Ambulatory Visit: Payer: Self-pay

## 2022-10-07 NOTE — Telephone Encounter (Signed)
Patient came to pick up stool cards

## 2022-10-27 ENCOUNTER — Other Ambulatory Visit (HOSPITAL_COMMUNITY): Payer: Self-pay

## 2022-10-27 ENCOUNTER — Other Ambulatory Visit (HOSPITAL_BASED_OUTPATIENT_CLINIC_OR_DEPARTMENT_OTHER): Payer: Self-pay

## 2022-10-27 ENCOUNTER — Telehealth: Payer: Medicare PPO | Admitting: Physician Assistant

## 2022-10-27 DIAGNOSIS — B9689 Other specified bacterial agents as the cause of diseases classified elsewhere: Secondary | ICD-10-CM

## 2022-10-27 DIAGNOSIS — J208 Acute bronchitis due to other specified organisms: Secondary | ICD-10-CM

## 2022-10-27 MED ORDER — ALBUTEROL SULFATE HFA 108 (90 BASE) MCG/ACT IN AERS
2.0000 | INHALATION_SPRAY | Freq: Four times a day (QID) | RESPIRATORY_TRACT | 0 refills | Status: DC | PRN
  Filled 2022-10-27: qty 6.7, 20d supply, fill #0

## 2022-10-27 MED ORDER — BENZONATATE 100 MG PO CAPS
100.0000 mg | ORAL_CAPSULE | Freq: Three times a day (TID) | ORAL | 0 refills | Status: DC | PRN
  Filled 2022-10-27: qty 30, 10d supply, fill #0

## 2022-10-27 MED ORDER — DOXYCYCLINE HYCLATE 100 MG PO TABS
100.0000 mg | ORAL_TABLET | Freq: Two times a day (BID) | ORAL | 0 refills | Status: DC
  Filled 2022-10-27: qty 14, 7d supply, fill #0

## 2022-10-27 NOTE — Patient Instructions (Addendum)
Pamela Ingram, thank you for joining [name], PA-C for today's virtual visit.  While this provider is not your primary care provider (PCP), if your PCP is located in our provider database this encounter information will be shared with them immediately following your visit.   [facility] account gives you access to today's visit and all your visits, tests, and labs performed at [facility] " click here if you don't have a [facility] account or go to [email or web address]  Consent: (Patient) Pamela Ingram provided verbal consent for this virtual visit at the beginning of the encounter.  Current Medications:  Current Outpatient Medications:    ALPRAZolam (XANAX) 0.5 MG tablet, Take 1 tablet by mouth every morning as needed, Disp: 90 tablet, Rfl: 0   Apremilast (OTEZLA) 30 MG TABS, Take 1 tablet (30 mg total) by mouth 2 (two) times daily., Disp: 60 tablet, Rfl: 9   dicyclomine (BENTYL) 20 MG tablet, Take 1 tablet (20 mg total) by mouth every 6 (six) hours as needed for spasms (abdominal pain)., Disp: 90 tablet, Rfl: 0   ibuprofen (ADVIL,MOTRIN) 200 MG tablet, Take 800 mg by mouth every 8 (eight) hours as needed for moderate pain. , Disp: , Rfl:    influenza vaccine adjuvanted (FLUAD QUADRIVALENT) 0.5 ML injection, Inject into the muscle., Disp: 0.5 mL, Rfl: 0   levothyroxine (SYNTHROID) 75 MCG tablet, Take 1 tablet (75 mcg total) by mouth daily., Disp: 90 tablet, Rfl: 1   sertraline (ZOLOFT) 100 MG tablet, Take 1 & 1/2  tablets by mouth in morning., Disp: 135 tablet, Rfl: 1   simvastatin (ZOCOR) 20 MG tablet, TAKE 1 TABLET BY MOUTH EVERY EVENING, Disp: 90 tablet, Rfl: 3   Tocilizumab (ACTEMRA) 162 MG/0.9ML SOSY, Inject 0.9 ml (1 syringe) subcutaneously every week, Disp: 12 Syringe, Rfl: 3   Medications ordered in this encounter:  No orders of the defined types were placed in this encounter.    *If you need refills on other medications prior to your  next appointment, please contact your pharmacy*  Follow-Up: Call back or seek an in-person evaluation if the symptoms worsen or if the condition fails to improve as anticipated.  [place] [phone]  Other Instructions Take antibiotic (Doxycycline) as directed.  Increase fluids.  Get plenty of rest. Use Mucinex for congestion. Use Tessalon and albuterol as directed. Take a daily probiotic (I recommend Align or Culturelle, but even Activia Yogurt may be beneficial).  A humidifier placed in the bedroom may offer some relief for a dry, scratchy throat of nasal irritation.  Read information below on acute bronchitis. Please call or return to clinic if symptoms are not improving.  Acute Bronchitis Bronchitis is when the airways that extend from the windpipe into the lungs get red, puffy, and painful (inflamed). Bronchitis often causes thick spit (mucus) to develop. This leads to a cough. A cough is the most common symptom of bronchitis. In acute bronchitis, the condition usually begins suddenly and goes away over time (usually in 2 weeks). Smoking, allergies, and asthma can make bronchitis worse. Repeated episodes of bronchitis may cause more lung problems.  HOME CARE Rest. Drink enough fluids to keep your pee (urine) clear or pale yellow (unless you need to limit fluids as told by your doctor). Only take over-the-counter or prescription medicines as told by your doctor. Avoid smoking and secondhand smoke. These can make bronchitis worse. If you are a smoker, think about using nicotine gum or  skin patches. Quitting smoking will help your lungs heal faster. Reduce the chance of getting bronchitis again by: Washing your hands often. Avoiding people with cold symptoms. Trying not to touch your hands to your mouth, nose, or eyes. Follow up with your doctor as told.  GET HELP IF: Your symptoms do not improve after 1 week of treatment. Symptoms include: Cough. Fever. Coughing up  thick spit. Body aches. Chest congestion. Chills. Shortness of breath. Sore throat.  GET HELP RIGHT AWAY IF:  You have an increased fever. You have chills. You have severe shortness of breath. You have bloody thick spit (sputum). You throw up (vomit) often. You lose too much body fluid (dehydration). You have a severe headache. You faint.  MAKE SURE YOU:  Understand these instructions. Will watch your condition. Will get help right away if you are not doing well or get worse. Document Released: 03/30/2008 Document Revised: 06/14/2013 Document Reviewed: 04/04/2013 [facility] Patient Information 2015 [place], [place]. This information is not intended to replace advice given to you by your health care provider. Make sure you discuss any questions you have with your health care provider.  If you have been instructed to have an in-person evaluation today at a local Urgent Care facility, please use the link below. It will take you to a list of all of our available [facility], including address, phone number and hours of operation. Please do not delay care.  [facility]  If you or a family member do not have a primary care provider, use the link below to schedule a visit and establish care. When you choose a [facility] primary care physician or advanced practice provider, you gain a long-term partner in health. Find a Primary Care Provider  Learn more about [facility]'s in-office and virtual care options: [facility]

## 2022-10-27 NOTE — Progress Notes (Signed)
Virtual Visit Consent   Pamela Ingram, you are scheduled for a virtual visit with a [facility] provider today. Just as with appointments in the office, your consent must be obtained to participate. Your consent will be active for this visit and any virtual visit you may have with one of our providers in the next 365 days. If you have a MyChart account, a copy of this consent can be sent to you electronically.  As this is a virtual visit, video technology does not allow for your provider to perform a traditional examination. This may limit your provider's ability to fully assess your condition. If your provider identifies any concerns that need to be evaluated in person or the need to arrange testing (such as labs, EKG, etc.), we will make arrangements to do so. Although advances in technology are sophisticated, we cannot ensure that it will always work on either your end or our end. If the connection with a video visit is poor, the visit may have to be switched to a telephone visit. With either a video or telephone visit, we are not always able to ensure that we have a secure connection.  By engaging in this virtual visit, you consent to the provision of healthcare and authorize for your insurance to be billed (if applicable) for the services provided during this visit. Depending on your insurance coverage, you may receive a charge related to this service.  I need to obtain your verbal consent now. Are you willing to proceed with your visit today? Pamela Ingram has provided verbal consent on 10/27/2022 for a virtual visit (video or telephone). [name], [place]  Date: 10/27/2022 5:07 PM  Virtual Visit via Video Note   I, [name], connected with  Pamela Ingram  ([id number], [birth year]) on 10/27/22 at  4:45 PM EST by a video-enabled telemedicine application and verified that I am speaking with the correct person using two identifiers.  Location: Patient: Virtual Visit Location Patient:  Home Provider: Virtual Visit Location Provider: Home Office   I discussed the limitations of evaluation and management by telemedicine and the availability of in person appointments. The patient expressed understanding and agreed to proceed.    History of Present Illness: Pamela Ingram is a 72 y.o. who identifies as a female who was assigned female at birth, and is being seen today for URI symptoms worsening over the past several days. Notes chest and head congestion with cough that is persistent and productive of thick sputum. Home COVID test was negative.   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   Anxiety 05/29/2016   Esophageal reflux 05/29/2016   Osteopenia 15/17/6160   Lichen sclerosus et atrophicus 07/24/2015   Overweight (BMI 25.0-29.9) 03/13/2013   Rheumatoid arthritis ([place]) 08/03/2011   Rheumatoid arthritis(714.0) 10/26/2008   Hyperlipemia 10/27/2007   Hypothyroid 10/27/2003   Depression with anxiety 10/26/1993    Allergies:  Allergies  Allergen Reactions   Amoxicillin Other (See Comments)    Pt gets yeast infection Has patient had a PCN reaction causing immediate rash, facial/tongue/throat swelling, SOB or lightheadedness with hypotension: No Has patient had a PCN reaction causing severe rash involving mucus membranes or skin necrosis: No Has patient had a PCN reaction that required hospitalization: No Has patient had a PCN reaction occurring within the  last 10 years: No If all of the above answers are "NO", then may proceed with Cephalosporin use.    Medications:  Current Outpatient Medications:    albuterol (VENTOLIN HFA) 108 (90 Base) MCG/ACT inhaler, Inhale 2 puffs into the lungs every 6 (six) hours as needed for wheezing or shortness of breath., Disp: 6.7 g, Rfl: 0   ALPRAZolam (XANAX)  0.5 MG tablet, Take 1 tablet by mouth every morning as needed, Disp: 90 tablet, Rfl: 0   Apremilast (OTEZLA) 30 MG TABS, Take 1 tablet (30 mg total) by mouth 2 (two) times daily., Disp: 60 tablet, Rfl: 9   benzonatate (TESSALON) 100 MG capsule, Take 1 capsule (100 mg total) by mouth 3 (three) times daily as needed for cough., Disp: 30 capsule, Rfl: 0   dicyclomine (BENTYL) 20 MG tablet, Take 1 tablet (20 mg total) by mouth every 6 (six) hours as needed for spasms (abdominal pain)., Disp: 90 tablet, Rfl: 0   doxycycline (VIBRA-TABS) 100 MG tablet, Take 1 tablet (100 mg total) by mouth 2 (two) times daily., Disp: 14 tablet, Rfl: 0   ibuprofen (ADVIL,MOTRIN) 200 MG tablet, Take 800 mg by mouth every 8 (eight) hours as needed for moderate pain. , Disp: , Rfl:    levothyroxine (SYNTHROID) 75 MCG tablet, Take 1 tablet (75 mcg total) by mouth daily., Disp: 90 tablet, Rfl: 1   sertraline (ZOLOFT) 100 MG tablet, Take 1 & 1/2  tablets by mouth in morning., Disp: 135 tablet, Rfl: 1   simvastatin (ZOCOR) 20 MG tablet, TAKE 1 TABLET BY MOUTH EVERY EVENING, Disp: 90 tablet, Rfl: 3   Tocilizumab (ACTEMRA) 162 MG/0.9ML SOSY, Inject 0.9 ml (1 syringe) subcutaneously every week, Disp: 12 Syringe, Rfl: 3  Observations/Objective: Patient is well-developed, well-nourished in no acute distress.  Resting comfortably at home.  Head is normocephalic, atraumatic.  No labored breathing. Speech is clear and coherent with logical content.  Patient is alert and oriented at baseline.   Assessment and Plan: 1. Acute bacterial bronchitis - benzonatate (TESSALON) 100 MG capsule; Take 1 capsule (100 mg total) by mouth 3 (three) times daily as needed for cough.  Dispense: 30 capsule; Refill: 0 - albuterol (VENTOLIN HFA) 108 (90 Base) MCG/ACT inhaler; Inhale 2 puffs into the lungs every 6 (six) hours as needed for wheezing or shortness of breath.  Dispense: 6.7 g; Refill: 0 - doxycycline (VIBRA-TABS) 100 MG tablet; Take 1 tablet  (100 mg total) by mouth 2 (two) times daily.  Dispense: 14 tablet; Refill: 0  Rx Doxycycline.  Increase fluids.  Rest.  Saline nasal spray.  Probiotic.  Mucinex as directed.  Humidifier in bedroom. Tessalon per orders and albuterol per orders.  Call or return to clinic if symptoms are not improving.   Follow Up Instructions: I discussed the assessment and treatment plan with the patient. The patient was provided an opportunity to ask questions and all were answered. The patient agreed with the plan and demonstrated an understanding of the instructions.  A copy of instructions were sent to the patient via MyChart unless otherwise noted below.   The patient was advised to call back or seek an in-person evaluation if the symptoms worsen or if the condition fails to improve as anticipated.  Time:  I spent 10 minutes with the patient via telehealth technology discussing the above problems/concerns.    [name], PA-C

## 2022-10-28 ENCOUNTER — Ambulatory Visit (HOSPITAL_BASED_OUTPATIENT_CLINIC_OR_DEPARTMENT_OTHER)
Admission: RE | Admit: 2022-10-28 | Discharge: 2022-10-28 | Disposition: A | Discharge status: Home / Self Care | Payer: Medicare PPO | Source: Ambulatory Visit | Attending: Internal Medicine | Admitting: Internal Medicine

## 2022-10-28 ENCOUNTER — Encounter: Payer: Self-pay | Admitting: Internal Medicine

## 2022-10-28 ENCOUNTER — Ambulatory Visit (INDEPENDENT_AMBULATORY_CARE_PROVIDER_SITE_OTHER): Payer: Medicare PPO | Admitting: Internal Medicine

## 2022-10-28 VITALS — HR 95 | Temp 99.2°F

## 2022-10-28 DIAGNOSIS — B338 Other specified viral diseases: Secondary | ICD-10-CM

## 2022-10-28 DIAGNOSIS — R059 Cough, unspecified: Secondary | ICD-10-CM | POA: Diagnosis not present

## 2022-10-28 DIAGNOSIS — R0989 Other specified symptoms and signs involving the circulatory and respiratory systems: Secondary | ICD-10-CM | POA: Insufficient documentation

## 2022-10-28 DIAGNOSIS — J069 Acute upper respiratory infection, unspecified: Secondary | ICD-10-CM | POA: Diagnosis not present

## 2022-10-28 DIAGNOSIS — M05742 Rheumatoid arthritis with rheumatoid factor of left hand without organ or systems involvement: Secondary | ICD-10-CM

## 2022-10-28 DIAGNOSIS — M05741 Rheumatoid arthritis with rheumatoid factor of right hand without organ or systems involvement: Secondary | ICD-10-CM

## 2022-10-29 ENCOUNTER — Other Ambulatory Visit (HOSPITAL_COMMUNITY): Payer: Self-pay

## 2022-10-29 NOTE — Progress Notes (Signed)
   Subjective:    Patient ID: Pamela Ingram, female    DOB: 02-25-1951, 72 y.o.   MRN: [id number]  HPI 72 year old Female with history of rheumatoid arthritis on DMARD therapy had video visit on January 2 for head and chest congestion worsening over the past several days.  Developed productive cough with thick sputum.  Home COVID test was negative.  She was started on [employer] and doxycycline for 7 days.  She also was advised to use albuterol inhaler.  Was advised to rest and stay well-hydrated.  We called to check on her because we were concerned that she might had pneumonia and felt that someone needed to listen to her chest today.  She is appreciative of our concerns.    Review of Systems no nausea vomiting or diarrhea     Objective:   Physical Exam Temperature 99.2 degrees, pulse oximetry 98%, pulse 95, does not appear to be tachypneic.  TMs are clear.  Pharynx is slightly injected.  She does have some fine rales in her right lower lobe.  She was sent for chest x-ray because of concern for possible pneumonia.  Chest x-ray fortunately was negative.       Assessment & Plan:   Acute lower respiratory infection-respiratory virus panel obtained and she was found to have RSV type B  History of rheumatoid arthritis on DMARD therapy   Productive cough  Rales in right lower chest  Plan: Agree with doxycycline 100 mg twice daily for 10 days.  May take Tessalon Perles 100 mg Up to 3 times daily as needed for cough.  Rest and stay well-hydrated.  Walk some to prevent atelectasis.  Monitor pulse oximetry.  Call if not improving within a couple of days.  Chest x-ray is negative for infiltrate.

## 2022-10-30 LAB — RESPIRATORY VIRUS PANEL
Adenovirus B: NOT DETECTED
HUMAN PARAINFLU VIRUS 1: NOT DETECTED
HUMAN PARAINFLU VIRUS 2: NOT DETECTED
HUMAN PARAINFLU VIRUS 3: NOT DETECTED
INFLUENZA A SUBTYPE H1: NOT DETECTED
INFLUENZA A SUBTYPE H3: NOT DETECTED
Influenza A: NOT DETECTED
Influenza B: NOT DETECTED
Metapneumovirus: NOT DETECTED
Respiratory Syncytial Virus A: NOT DETECTED
Respiratory Syncytial Virus B: DETECTED — AB
Rhinovirus: NOT DETECTED

## 2022-11-02 ENCOUNTER — Other Ambulatory Visit (HOSPITAL_BASED_OUTPATIENT_CLINIC_OR_DEPARTMENT_OTHER): Payer: Self-pay

## 2022-11-02 ENCOUNTER — Other Ambulatory Visit: Payer: Self-pay

## 2022-11-02 MED ORDER — OTEZLA 30 MG PO TABS
30.0000 mg | ORAL_TABLET | Freq: Two times a day (BID) | ORAL | 11 refills | Status: DC
  Filled 2022-11-02 – 2022-11-04 (×4): qty 60, 30d supply, fill #0
  Filled 2022-12-09: qty 60, 30d supply, fill #1
  Filled 2023-01-12: qty 60, 30d supply, fill #2
  Filled 2023-02-16: qty 60, 30d supply, fill #3
  Filled 2023-03-18: qty 60, 30d supply, fill #4
  Filled 2023-04-22: qty 60, 30d supply, fill #5

## 2022-11-03 ENCOUNTER — Other Ambulatory Visit (HOSPITAL_COMMUNITY): Payer: Self-pay

## 2022-11-03 ENCOUNTER — Other Ambulatory Visit: Payer: Self-pay

## 2022-11-04 ENCOUNTER — Other Ambulatory Visit (HOSPITAL_BASED_OUTPATIENT_CLINIC_OR_DEPARTMENT_OTHER): Payer: Self-pay

## 2022-11-04 ENCOUNTER — Other Ambulatory Visit (HOSPITAL_COMMUNITY): Payer: Self-pay

## 2022-11-04 ENCOUNTER — Other Ambulatory Visit: Payer: Self-pay

## 2022-11-07 NOTE — Patient Instructions (Signed)
Patient will continue with doxycycline twice daily for 10 days due to rales in her right lower lobe.  Has been diagnosed with RSV type B.  May take Tessalon Perles up to 3 times daily as needed for cough.  Rest and stay well-hydrated.  Monitor pulse oximetry.  Walk some to prevent atelectasis.  Call if not improving within a couple of days.

## 2022-11-09 ENCOUNTER — Other Ambulatory Visit (HOSPITAL_BASED_OUTPATIENT_CLINIC_OR_DEPARTMENT_OTHER): Payer: Self-pay | Admitting: Internal Medicine

## 2022-11-09 DIAGNOSIS — Z1231 Encounter for screening mammogram for malignant neoplasm of breast: Secondary | ICD-10-CM

## 2022-11-13 ENCOUNTER — Other Ambulatory Visit (HOSPITAL_BASED_OUTPATIENT_CLINIC_OR_DEPARTMENT_OTHER): Payer: Self-pay

## 2022-11-18 ENCOUNTER — Other Ambulatory Visit (HOSPITAL_BASED_OUTPATIENT_CLINIC_OR_DEPARTMENT_OTHER): Payer: Self-pay

## 2022-11-19 ENCOUNTER — Other Ambulatory Visit (HOSPITAL_COMMUNITY): Payer: Self-pay

## 2022-11-24 ENCOUNTER — Encounter (HOSPITAL_BASED_OUTPATIENT_CLINIC_OR_DEPARTMENT_OTHER): Payer: Self-pay

## 2022-11-24 ENCOUNTER — Ambulatory Visit (HOSPITAL_BASED_OUTPATIENT_CLINIC_OR_DEPARTMENT_OTHER)
Admission: RE | Admit: 2022-11-24 | Discharge: 2022-11-24 | Disposition: A | Discharge status: Home / Self Care | Payer: Medicare PPO | Source: Ambulatory Visit | Attending: Internal Medicine | Admitting: Internal Medicine

## 2022-11-24 DIAGNOSIS — Z1231 Encounter for screening mammogram for malignant neoplasm of breast: Secondary | ICD-10-CM | POA: Insufficient documentation

## 2022-12-05 ENCOUNTER — Other Ambulatory Visit (HOSPITAL_COMMUNITY): Payer: Self-pay

## 2022-12-05 ENCOUNTER — Other Ambulatory Visit (HOSPITAL_BASED_OUTPATIENT_CLINIC_OR_DEPARTMENT_OTHER): Payer: Self-pay

## 2022-12-09 ENCOUNTER — Other Ambulatory Visit (HOSPITAL_BASED_OUTPATIENT_CLINIC_OR_DEPARTMENT_OTHER): Payer: Self-pay

## 2022-12-09 ENCOUNTER — Other Ambulatory Visit: Payer: Self-pay

## 2022-12-10 ENCOUNTER — Other Ambulatory Visit (HOSPITAL_BASED_OUTPATIENT_CLINIC_OR_DEPARTMENT_OTHER): Payer: Self-pay

## 2022-12-10 ENCOUNTER — Other Ambulatory Visit: Payer: Self-pay

## 2022-12-10 DIAGNOSIS — F4311 Post-traumatic stress disorder, acute: Secondary | ICD-10-CM | POA: Diagnosis not present

## 2022-12-10 DIAGNOSIS — F411 Generalized anxiety disorder: Secondary | ICD-10-CM | POA: Diagnosis not present

## 2022-12-10 MED ORDER — ALPRAZOLAM 0.5 MG PO TABS
0.5000 mg | ORAL_TABLET | Freq: Every morning | ORAL | 0 refills | Status: DC
  Filled 2022-12-10: qty 30, 30d supply, fill #0

## 2022-12-10 MED ORDER — SERTRALINE HCL 100 MG PO TABS
200.0000 mg | ORAL_TABLET | Freq: Every morning | ORAL | 1 refills | Status: DC
  Filled 2022-12-10: qty 180, 90d supply, fill #0
  Filled 2023-06-14: qty 180, 90d supply, fill #1

## 2023-01-08 ENCOUNTER — Other Ambulatory Visit (HOSPITAL_BASED_OUTPATIENT_CLINIC_OR_DEPARTMENT_OTHER): Payer: Self-pay

## 2023-01-12 ENCOUNTER — Other Ambulatory Visit (HOSPITAL_BASED_OUTPATIENT_CLINIC_OR_DEPARTMENT_OTHER): Payer: Self-pay

## 2023-01-14 DIAGNOSIS — F4311 Post-traumatic stress disorder, acute: Secondary | ICD-10-CM | POA: Diagnosis not present

## 2023-01-14 DIAGNOSIS — F411 Generalized anxiety disorder: Secondary | ICD-10-CM | POA: Diagnosis not present

## 2023-01-19 ENCOUNTER — Other Ambulatory Visit: Payer: Medicare PPO

## 2023-01-19 DIAGNOSIS — F439 Reaction to severe stress, unspecified: Secondary | ICD-10-CM

## 2023-01-19 DIAGNOSIS — E7800 Pure hypercholesterolemia, unspecified: Secondary | ICD-10-CM

## 2023-01-19 DIAGNOSIS — F419 Anxiety disorder, unspecified: Secondary | ICD-10-CM

## 2023-01-19 DIAGNOSIS — E039 Hypothyroidism, unspecified: Secondary | ICD-10-CM

## 2023-01-19 NOTE — Progress Notes (Addendum)
Annual Wellness Visit    Patient Care Team: Marlynn Hinckley, Cresenciano Lick, MD as PCP - General (Internal Medicine) Kennith Center, RD as Dietitian (Family Medicine)  Visit Date: 01/26/23   Chief Complaint  Patient presents with   Annual Exam   Medicare Wellness    Subjective:   Patient: Pamela Ingram, Female    DOB: Mar 20, 1951, 72 y.o.   MRN: [id number]  Pamela Ingram is a 72 y.o. Female who presents today for her Annual Wellness Visit. She has a history of anxiety, hyperlipidemia, pleurisy with effusion, rheumatoid arthritis, thyroid disease.  History of anxiety and depression treated with Xanax 0.5 mg daily in the morning as needed, Zoloft 200 mg daily in the morning.  History of hypothyroidism treated with Synthroid 75 mg daily.  History of hyperlipidemia treated with Zocor 20 mg daily in the evening. Coronary calcium score 0 on 02/20/22. CHOL elevated at 244, LDL at 144 on 01/19/23.  History of IBS. Has not needed Bentyl recently.   Glucose, electrolytes, CBC normal. Thyroid, liver, kidney function normal.  Mammogram last completed 11/25/22. No mammographic evidence of malignancy. Recommended repeat in 2025.  Colonoscopy last completed 02/23/22. Diverticulosis in sigmoid colon. Examination otherwise normal. No recommendation at this time regarding repeat.  Past Medical History:  Diagnosis Date   Anxiety    Hyperlipidemia    Pleurisy with effusion    Rheumatoid arthritis(714.0)    Thyroid disease    hypothyroidism     Family History  Problem Relation Age of Onset   Emphysema Mother    Rheum arthritis Mother    Hypertension Father    Stroke Father    Diabetes Paternal Uncle    Colon cancer Neg Hx    Rectal cancer Neg Hx    Stomach cancer Neg Hx    Esophageal cancer Neg Hx    Cancer Neg Hx      Social History   Social History Narrative      Social:Does not smoke. Former smoker.  Social alcohol consumption. EtOH 1-2 drinks/week. 3 caffeine/day. She is married.  2 adult  sons.  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  Father died of complications of sepsis.  He had a stroke.  Mother died of respiratory complications and had history of rheumatoid arthritis.  1 sister living.  No brothers.     Review of Systems  Constitutional:  Negative for chills, fever, malaise/fatigue and weight loss.  HENT:  Negative for hearing loss, sinus pain and sore throat.   Respiratory:  Negative for cough, hemoptysis and shortness of breath.   Cardiovascular:  Negative for chest pain, palpitations, leg swelling and PND.  Gastrointestinal:  Positive for abdominal pain (Lower, mild). Negative for constipation, diarrhea, heartburn, nausea and vomiting.  Genitourinary:  Negative for dysuria, frequency and urgency.  Musculoskeletal:  Negative for back pain, myalgias and neck pain.  Skin:  Negative for itching and rash.  Neurological:  Negative for dizziness, tingling, seizures and headaches.  Endo/Heme/Allergies:  Negative for polydipsia.  Psychiatric/Behavioral:  Negative for depression. The patient is not nervous/anxious.       Objective:   Vitals: BP 110/70   Pulse 73   Temp 97.8 F (36.6 C) (Tympanic)   Ht 5' 3.5" (1.613 m)   Wt 159 lb 12.8 oz (72.5 kg)   SpO2 97%   BMI 27.86 kg/m  Physical Exam Vitals and nursing note reviewed.  Constitutional:      General: She is not in acute distress.    Appearance: Normal appearance. She is not ill-appearing or toxic-appearing.  HENT:     Head: Normocephalic and atraumatic.     Right Ear: Hearing, tympanic membrane, ear canal and external ear normal.     Left Ear: Hearing, tympanic membrane, ear canal and external ear normal.     Mouth/Throat:     Pharynx: Oropharynx is clear.  Eyes:     Extraocular Movements: Extraocular movements intact.     Pupils: Pupils are equal, round, and reactive to light.  Neck:      Thyroid: No thyroid mass, thyromegaly or thyroid tenderness.     Vascular: No carotid bruit.  Cardiovascular:     Rate and Rhythm: Normal rate and regular rhythm. No extrasystoles are present.    Pulses:          Dorsalis pedis pulses are 1+ on the right side and 1+ on the left side.     Heart sounds: Normal heart sounds. No murmur heard.    No friction rub. No gallop.  Pulmonary:     Effort: Pulmonary effort is normal.     Breath sounds: Normal breath sounds. No decreased breath sounds, wheezing, rhonchi or rales.  Chest:     Chest wall: No mass.  Abdominal:     Palpations: Abdomen is soft. There is no hepatomegaly, splenomegaly or mass.     Tenderness: There is no abdominal tenderness.     Hernia: No hernia is present.  Genitourinary:    Comments: Bimanual is normal. Musculoskeletal:     Cervical back: Normal range of motion.     Right lower leg: No edema.     Left lower leg: No edema.  Lymphadenopathy:     Cervical: No cervical adenopathy.     Upper Body:     Right upper body: No supraclavicular adenopathy.     Left upper body: No supraclavicular adenopathy.  Skin:    General: Skin is warm and dry.  Neurological:     General: No focal deficit present.     Mental Status: She is alert and oriented to person, place, and time. Mental status is at baseline.     Sensory: Sensation is intact.     Motor: Motor function is intact. No weakness.     Deep Tendon Reflexes: Reflexes are normal and symmetric.  Psychiatric:        Attention and Perception: Attention normal.        Mood and Affect: Mood normal.        Speech: Speech normal.        Behavior: Behavior normal.        Thought Content: Thought content normal.        Cognition and Memory: Cognition normal.        Judgment: Judgment normal.      Most recent functional status assessment:    01/26/2023   10:45 AM  In your present state of health, do you have any difficulty performing the following activities:  Hearing? 0   Vision? 1  Difficulty concentrating or making decisions? 1  Walking or climbing stairs? 0  Dressing or bathing? 0  Doing errands, shopping? 0  Preparing Food and eating ? N  Using the Toilet? N  In the past six months, have you accidently leaked urine? Y  Do you have problems with loss of bowel control? N  Managing your Medications? N  Managing your Finances? N  Housekeeping or managing your Housekeeping? N   Most recent fall risk assessment:    01/26/2023   10:45 AM  Fall Risk   Falls in the past year? 0  Number falls in past yr: 0  Injury with Fall? 0  Risk for fall due to : No Fall Risks  Follow up Falls prevention discussed    Most recent depression screenings:    01/26/2023   10:45 AM 10/28/2022   11:26 AM  PHQ 2/9 Scores  PHQ - 2 Score 0 0   Most recent cognitive screening:    01/26/2023   10:47 AM  6CIT Screen  What Year? 0 points  What month? 0 points  What time? 0 points  Count back from 20 0 points  Months in reverse 0 points  Repeat phrase 0 points  Total Score 0 points     Results:   Studies obtained and personally reviewed by me:  Mammogram last completed 11/25/22. No mammographic evidence of malignancy. Recommended repeat in 2025.  Colonoscopy last completed 02/23/22. Diverticulosis in sigmoid colon. Examination otherwise normal. No recommendation at this time regarding repeat.   Labs:       Component Value Date/Time   NA 140 01/19/2023 0944   NA 142 05/31/2017 0907   K 4.3 01/19/2023 0944   CL 104 01/19/2023 0944   CO2 28 01/19/2023 0944   GLUCOSE 86 01/19/2023 0944   BUN 21 01/19/2023 0944   BUN 21 05/31/2017 0907   CREATININE 0.94 01/19/2023 0944   CALCIUM 9.6 01/19/2023 0944   PROT 7.2 01/19/2023 0944   ALBUMIN 4.3 11/01/2021 1253   AST 23 01/19/2023 0944   ALT 17 01/19/2023 0944   ALKPHOS 55 11/01/2021 1253   BILITOT 0.5 01/19/2023 0944   GFRNONAA >60 11/01/2021 1253   GFRNONAA 79 12/05/2020 1122   GFRAA 92 12/05/2020 1122      Lab Results  Component Value Date   WBC 5.9 01/19/2023   HGB 14.0 01/19/2023   HCT 41.8 01/19/2023   MCV 87.1 01/19/2023   PLT 230 01/19/2023    Lab Results  Component Value Date   CHOL 244 (H) 01/19/2023   HDL 82 01/19/2023   LDLCALC 144 (H) 01/19/2023   TRIG 79 01/19/2023   CHOLHDL 3.0 01/19/2023    No results found for: "HGBA1C"   Lab Results  Component Value Date   TSH 1.17 01/19/2023    Assessment & Plan:   Anxiety and depression: treated with Xanax 0.5 mg daily in the morning as needed, Zoloft 200 mg daily in the morning.  Hypothyroidism: treated with Synthroid 75 mg daily. TSH normal at 1.17 on 01/19/23.  Hyperlipidemia: treated with Zocor 20 mg daily in the evening. Coronary calcium score 0 on 02/20/22. CHOL elevated at 244, LDL at 144 on 01/19/23.  IBS: stable with some mild lower abdominal pain. Has not needed Bentyl recently.   Rheumatoid arthritis seen by Rheumatology. Moderate to severe stage 2. Now on DMARD [place])  UTI- culture grew E.coli  and was treated with Cipro  Mammogram: last completed 11/25/22. No mammographic evidence of malignancy. Recommended repeat in 2025.  Colonoscopy: last completed 02/23/22. Diverticulosis in sigmoid colon. Examination otherwise normal. No recommendation at this time regarding repeat.  Vaccine counseling: UTD on flu, tetanus, Covid-19, shingles, pneumonia vaccines.       Annual wellness visit done today including the all of the following: Reviewed patient's Family Medical History Reviewed and  updated list of patient's medical providers Assessment of cognitive impairment was done Assessed patient's functional ability Established a written schedule for health screening services Health Risk Assessent Completed and Reviewed  Discussed health benefits of physical activity, and encouraged her to engage in regular exercise appropriate for her age and condition.        I,Alexander Ruley,acting as a Neurosurgeonscribe for Margaree MackintoshMary  J Arsalan Brisbin, MD.,have documented all relevant documentation on the behalf of Margaree MackintoshMary J Floris Neuhaus, MD,as directed by  Margaree MackintoshMary J Kalee Broxton, MD while in the presence of Margaree MackintoshMary J Aneira Cavitt, MD.  I, Margaree MackintoshMary J Willodene Stallings, MD, have reviewed all documentation for this visit. The documentation on 02/02/23 for the exam, diagnosis, procedures, and orders are all accurate and complete.

## 2023-01-20 LAB — CBC WITH DIFFERENTIAL/PLATELET
Absolute Monocytes: 490 cells/uL (ref 200–950)
Basophils Absolute: 59 cells/uL (ref 0–200)
Basophils Relative: 1 %
Eosinophils Absolute: 248 cells/uL (ref 15–500)
Eosinophils Relative: 4.2 %
HCT: 41.8 % (ref 35.0–45.0)
Hemoglobin: 14 g/dL (ref 11.7–15.5)
Lymphs Abs: 1522 cells/uL (ref 850–3900)
MCH: 29.2 pg (ref 27.0–33.0)
MCHC: 33.5 g/dL (ref 32.0–36.0)
MCV: 87.1 fL (ref 80.0–100.0)
MPV: 11.3 fL (ref 7.5–12.5)
Monocytes Relative: 8.3 %
Neutro Abs: 3581 cells/uL (ref 1500–7800)
Neutrophils Relative %: 60.7 %
Platelets: 230 10*3/uL (ref 140–400)
RBC: 4.8 10*6/uL (ref 3.80–5.10)
RDW: 12.5 % (ref 11.0–15.0)
Total Lymphocyte: 25.8 %
WBC: 5.9 10*3/uL (ref 3.8–10.8)

## 2023-01-20 LAB — COMPLETE METABOLIC PANEL WITH GFR
AG Ratio: 1.8 (calc) (ref 1.0–2.5)
ALT: 17 U/L (ref 6–29)
AST: 23 U/L (ref 10–35)
Albumin: 4.6 g/dL (ref 3.6–5.1)
Alkaline phosphatase (APISO): 62 U/L (ref 37–153)
BUN: 21 mg/dL (ref 7–25)
CO2: 28 mmol/L (ref 20–32)
Calcium: 9.6 mg/dL (ref 8.6–10.4)
Chloride: 104 mmol/L (ref 98–110)
Creat: 0.94 mg/dL (ref 0.60–1.00)
Globulin: 2.6 g/dL (calc) (ref 1.9–3.7)
Glucose, Bld: 86 mg/dL (ref 65–99)
Potassium: 4.3 mmol/L (ref 3.5–5.3)
Sodium: 140 mmol/L (ref 135–146)
Total Bilirubin: 0.5 mg/dL (ref 0.2–1.2)
Total Protein: 7.2 g/dL (ref 6.1–8.1)
eGFR: 65 mL/min/{1.73_m2} (ref >=60)

## 2023-01-20 LAB — TSH: TSH: 1.17 mIU/L (ref 0.40–4.50)

## 2023-01-20 LAB — LIPID PANEL
Cholesterol: 244 mg/dL — ABNORMAL HIGH (ref <200)
HDL: 82 mg/dL (ref >=50)
LDL Cholesterol (Calc): 144 mg/dL (calc) — ABNORMAL HIGH
Non-HDL Cholesterol (Calc): 162 mg/dL (calc) — ABNORMAL HIGH (ref <130)
Total CHOL/HDL Ratio: 3 (calc) (ref <5.0)
Triglycerides: 79 mg/dL (ref <150)

## 2023-01-26 ENCOUNTER — Encounter: Payer: Self-pay | Admitting: Internal Medicine

## 2023-01-26 ENCOUNTER — Ambulatory Visit (INDEPENDENT_AMBULATORY_CARE_PROVIDER_SITE_OTHER): Payer: Medicare PPO | Admitting: Internal Medicine

## 2023-01-26 VITALS — BP 110/70 | HR 73 | Temp 97.8°F | Ht 63.5 in | Wt 159.8 lb

## 2023-01-26 DIAGNOSIS — M05741 Rheumatoid arthritis with rheumatoid factor of right hand without organ or systems involvement: Secondary | ICD-10-CM | POA: Diagnosis not present

## 2023-01-26 DIAGNOSIS — K580 Irritable bowel syndrome with diarrhea: Secondary | ICD-10-CM

## 2023-01-26 DIAGNOSIS — E039 Hypothyroidism, unspecified: Secondary | ICD-10-CM

## 2023-01-26 DIAGNOSIS — F419 Anxiety disorder, unspecified: Secondary | ICD-10-CM

## 2023-01-26 DIAGNOSIS — Z0000 Encounter for general adult medical examination without abnormal findings: Secondary | ICD-10-CM | POA: Diagnosis not present

## 2023-01-26 DIAGNOSIS — R82998 Other abnormal findings in urine: Secondary | ICD-10-CM | POA: Diagnosis not present

## 2023-01-26 DIAGNOSIS — E7800 Pure hypercholesterolemia, unspecified: Secondary | ICD-10-CM

## 2023-01-26 DIAGNOSIS — N390 Urinary tract infection, site not specified: Secondary | ICD-10-CM | POA: Diagnosis not present

## 2023-01-26 DIAGNOSIS — M05742 Rheumatoid arthritis with rheumatoid factor of left hand without organ or systems involvement: Secondary | ICD-10-CM

## 2023-01-26 DIAGNOSIS — F4321 Adjustment disorder with depressed mood: Secondary | ICD-10-CM

## 2023-01-26 LAB — POCT URINALYSIS DIPSTICK
Bilirubin, UA: NEGATIVE
Blood, UA: NEGATIVE
Glucose, UA: NEGATIVE
Ketones, UA: NEGATIVE
Nitrite, UA: NEGATIVE
Protein, UA: NEGATIVE
Spec Grav, UA: 1.015 (ref 1.010–1.025)
Urobilinogen, UA: 0.2 E.U./dL
pH, UA: 5 (ref 5.0–8.0)

## 2023-01-28 ENCOUNTER — Other Ambulatory Visit (HOSPITAL_BASED_OUTPATIENT_CLINIC_OR_DEPARTMENT_OTHER): Payer: Self-pay

## 2023-01-28 MED ORDER — AREXVY 120 MCG/0.5ML IM SUSR
0.5000 mL | INTRAMUSCULAR | 0 refills | Status: DC
  Filled 2023-01-28: qty 0.5, 1d supply, fill #0

## 2023-01-29 ENCOUNTER — Other Ambulatory Visit (HOSPITAL_BASED_OUTPATIENT_CLINIC_OR_DEPARTMENT_OTHER): Payer: Self-pay

## 2023-01-29 LAB — URINE CULTURE
MICRO NUMBER:: 14771645
SPECIMEN QUALITY:: ADEQUATE

## 2023-01-29 MED ORDER — CIPROFLOXACIN HCL 250 MG PO TABS
250.0000 mg | ORAL_TABLET | Freq: Two times a day (BID) | ORAL | 0 refills | Status: AC
  Filled 2023-01-29 – 2023-01-30 (×2): qty 10, 5d supply, fill #0

## 2023-01-30 ENCOUNTER — Other Ambulatory Visit (HOSPITAL_COMMUNITY): Payer: Self-pay

## 2023-02-01 ENCOUNTER — Other Ambulatory Visit (HOSPITAL_BASED_OUTPATIENT_CLINIC_OR_DEPARTMENT_OTHER): Payer: Self-pay

## 2023-02-02 NOTE — Patient Instructions (Addendum)
Labs are stable. Continue Rheumatology follow up. Continue meds for anxiety and depression. Take Cipro for urinary infection.

## 2023-02-04 ENCOUNTER — Ambulatory Visit (INDEPENDENT_AMBULATORY_CARE_PROVIDER_SITE_OTHER): Payer: Medicare PPO

## 2023-02-04 VITALS — BP 118/74 | HR 71 | Temp 98.2°F | Ht 63.5 in | Wt 159.0 lb

## 2023-02-04 DIAGNOSIS — Z0189 Encounter for other specified special examinations: Secondary | ICD-10-CM

## 2023-02-04 LAB — POCT URINALYSIS DIPSTICK
Bilirubin, UA: NEGATIVE
Blood, UA: NEGATIVE
Glucose, UA: NEGATIVE
Ketones, UA: NEGATIVE
Leukocytes, UA: NEGATIVE
Nitrite, UA: NEGATIVE
Protein, UA: NEGATIVE
Spec Grav, UA: 1.015 (ref 1.010–1.025)
Urobilinogen, UA: 0.2 E.U./dL
pH, UA: 5 (ref 5.0–8.0)

## 2023-02-04 NOTE — Progress Notes (Signed)
Patient was seen in office for a follow up on a UTI,  urine dipstick was performed with negative results and vital signs were within normal limits.  

## 2023-02-08 DIAGNOSIS — F411 Generalized anxiety disorder: Secondary | ICD-10-CM | POA: Diagnosis not present

## 2023-02-08 DIAGNOSIS — F4311 Post-traumatic stress disorder, acute: Secondary | ICD-10-CM | POA: Diagnosis not present

## 2023-02-16 ENCOUNTER — Other Ambulatory Visit: Payer: Self-pay | Admitting: Internal Medicine

## 2023-02-16 ENCOUNTER — Other Ambulatory Visit (HOSPITAL_BASED_OUTPATIENT_CLINIC_OR_DEPARTMENT_OTHER): Payer: Self-pay

## 2023-02-16 ENCOUNTER — Other Ambulatory Visit: Payer: Self-pay

## 2023-02-16 MED ORDER — SIMVASTATIN 20 MG PO TABS
20.0000 mg | ORAL_TABLET | Freq: Every evening | ORAL | 3 refills | Status: DC
  Filled 2023-02-16: qty 90, 90d supply, fill #0
  Filled 2023-05-13: qty 90, 90d supply, fill #1
  Filled 2023-08-09: qty 90, 90d supply, fill #2

## 2023-02-18 DIAGNOSIS — M0579 Rheumatoid arthritis with rheumatoid factor of multiple sites without organ or systems involvement: Secondary | ICD-10-CM | POA: Diagnosis not present

## 2023-02-18 DIAGNOSIS — D84821 Immunodeficiency due to drugs: Secondary | ICD-10-CM | POA: Diagnosis not present

## 2023-02-18 DIAGNOSIS — Z79899 Other long term (current) drug therapy: Secondary | ICD-10-CM | POA: Diagnosis not present

## 2023-02-18 DIAGNOSIS — Z1330 Encounter for screening examination for mental health and behavioral disorders, unspecified: Secondary | ICD-10-CM | POA: Diagnosis not present

## 2023-02-21 IMAGING — MG MM DIGITAL SCREENING BILAT W/ TOMO AND CAD
8 series · 8 of 24 positions shown · non-contrast
Comparison: Previous exam(s).

CLINICAL DATA: Screening.

EXAM:
DIGITAL SCREENING BILATERAL MAMMOGRAM WITH TOMOSYNTHESIS AND CAD
TECHNIQUE: Bilateral screening digital craniocaudal and mediolateral oblique
mammograms were obtained. Bilateral screening digital breast
tomosynthesis was performed. The images were evaluated with
computer-aided detection.

[L CC synth-2D]
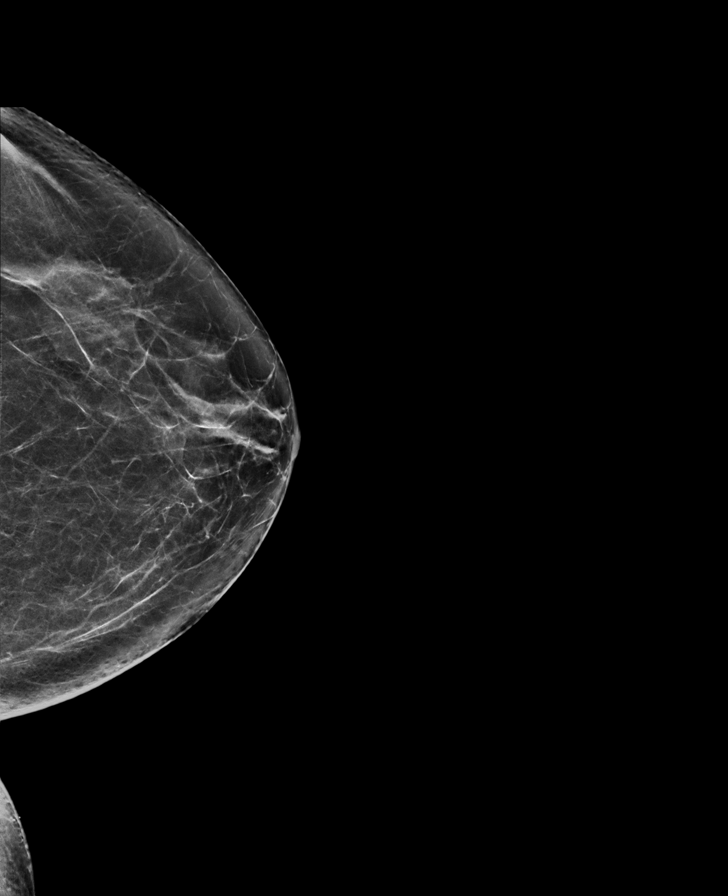

[R CC synth-2D]
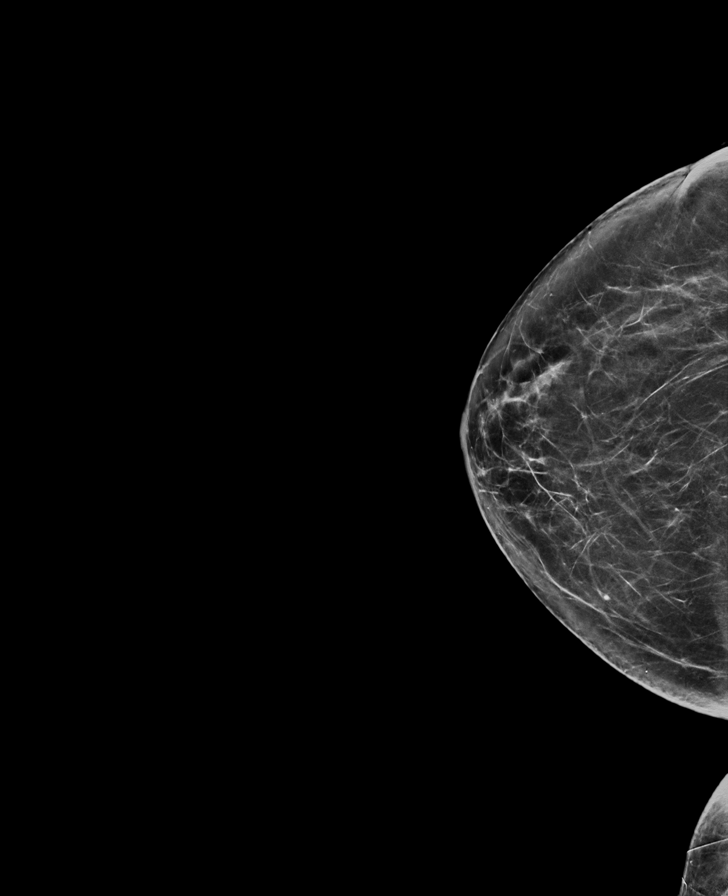

[L MLO synth-2D]
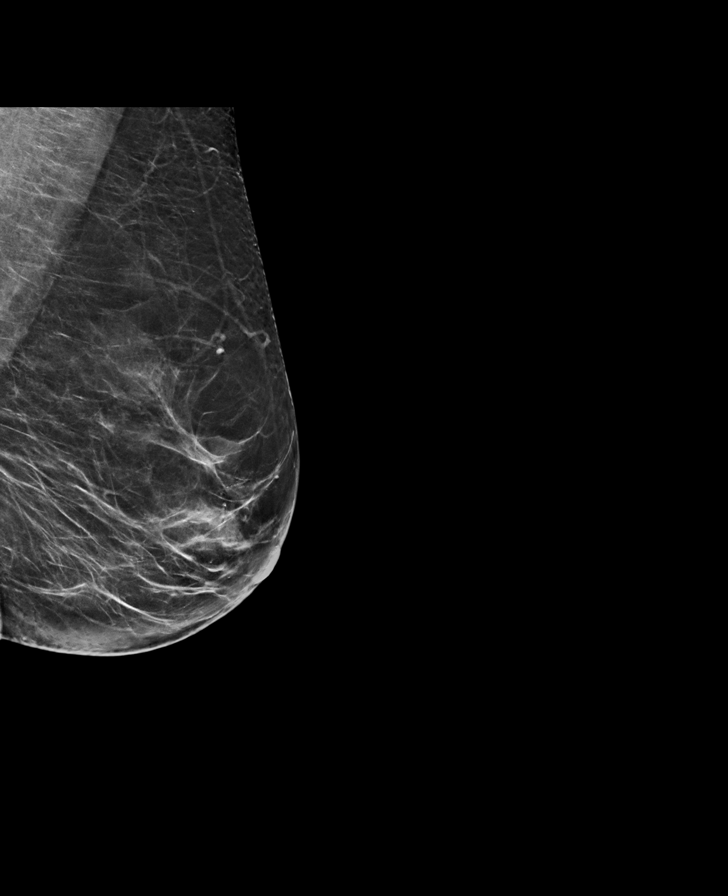

[R MLO synth-2D]
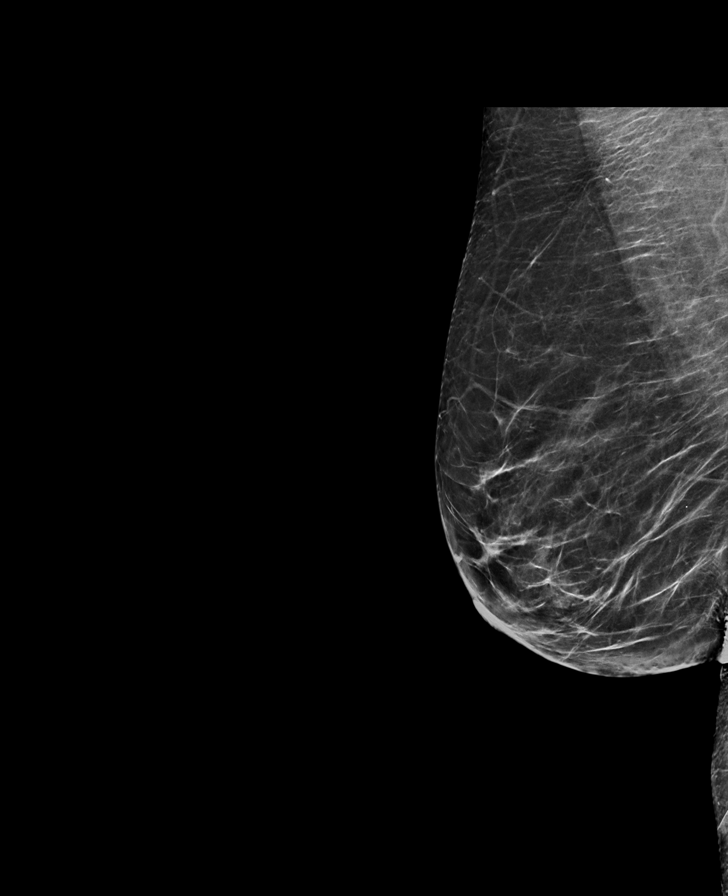

[R MLO tomo · tomo slice 35/69.0]
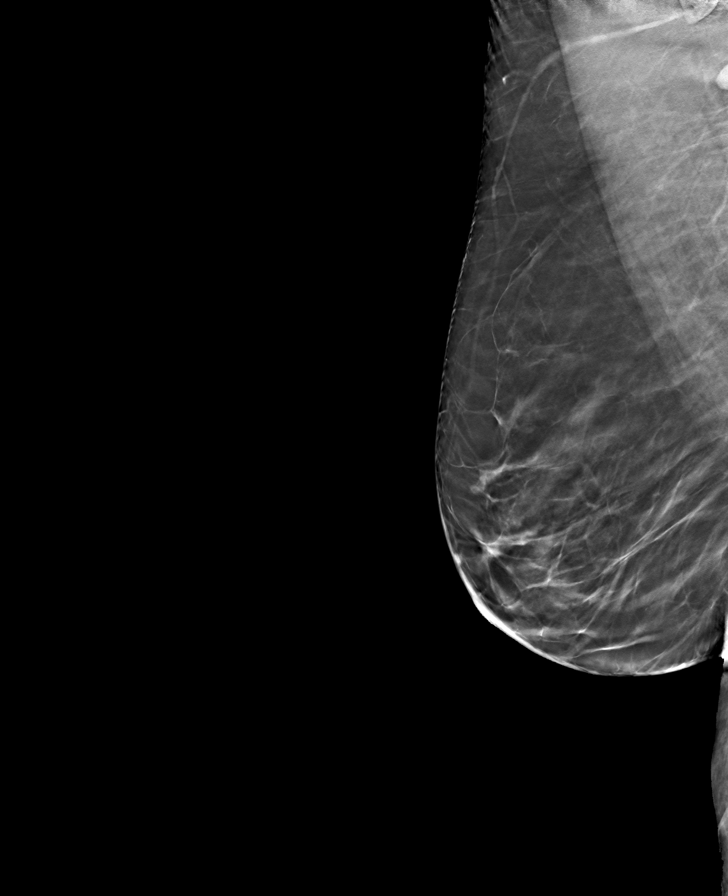

[L CC tomo · tomo slice 35/68.0]
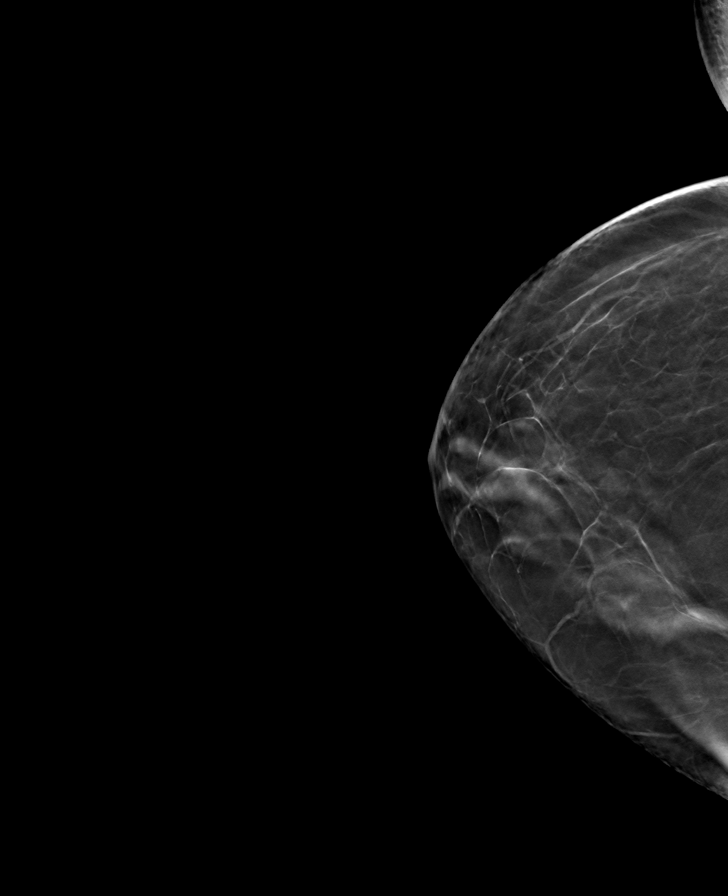

[L MLO tomo · tomo slice 36/71.0]
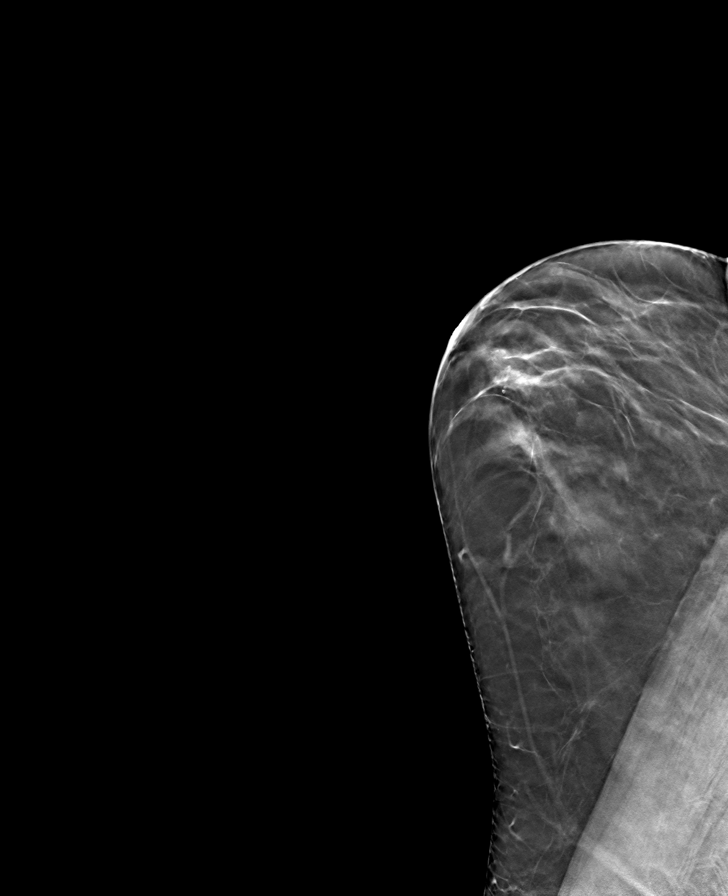

[R CC tomo · tomo slice 34/67.0]
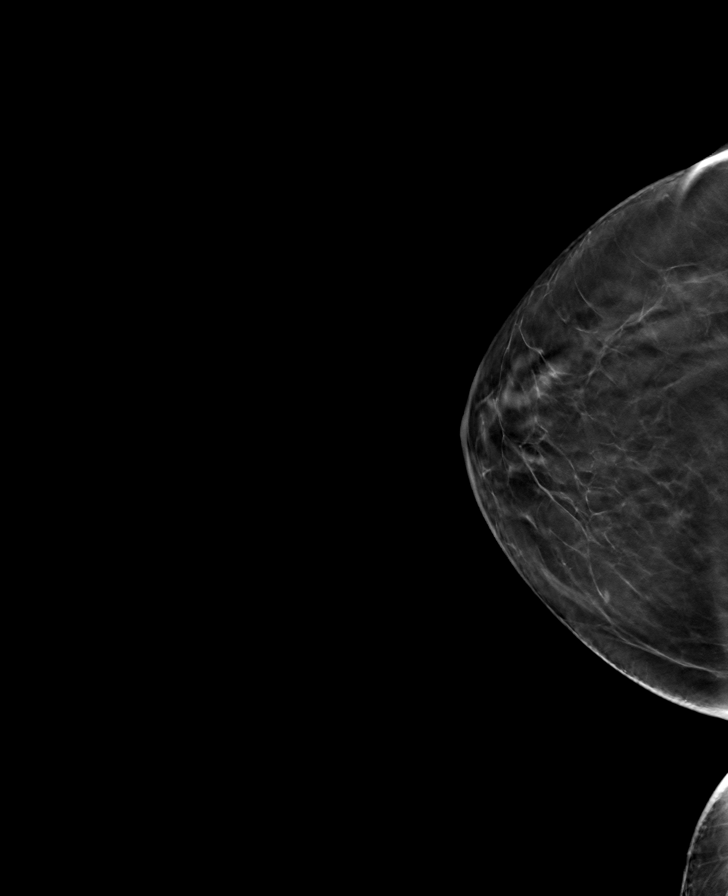

[8 of 24 positions shown; findings below may reference images not displayed]

ACR Breast Density Category b: There are scattered areas of
fibroglandular density.
FINDINGS: There are no findings suspicious for malignancy.
IMPRESSION: No mammographic evidence of malignancy. A result letter of this
screening mammogram will be mailed directly to the patient.

RECOMMENDATION:
Screening mammogram in one year. (Code:51-O-LD2)

BI-RADS CATEGORY  1: Negative.

## 2023-03-02 ENCOUNTER — Encounter: Payer: Self-pay | Admitting: Internal Medicine

## 2023-03-02 DIAGNOSIS — H04123 Dry eye syndrome of bilateral lacrimal glands: Secondary | ICD-10-CM | POA: Diagnosis not present

## 2023-03-02 DIAGNOSIS — H35033 Hypertensive retinopathy, bilateral: Secondary | ICD-10-CM | POA: Diagnosis not present

## 2023-03-02 DIAGNOSIS — H25813 Combined forms of age-related cataract, bilateral: Secondary | ICD-10-CM | POA: Diagnosis not present

## 2023-03-02 DIAGNOSIS — H35363 Drusen (degenerative) of macula, bilateral: Secondary | ICD-10-CM | POA: Diagnosis not present

## 2023-03-04 ENCOUNTER — Encounter: Payer: Self-pay | Admitting: Internal Medicine

## 2023-03-04 DIAGNOSIS — F4311 Post-traumatic stress disorder, acute: Secondary | ICD-10-CM | POA: Diagnosis not present

## 2023-03-04 DIAGNOSIS — F411 Generalized anxiety disorder: Secondary | ICD-10-CM | POA: Diagnosis not present

## 2023-03-18 ENCOUNTER — Other Ambulatory Visit: Payer: Self-pay | Admitting: Internal Medicine

## 2023-03-18 ENCOUNTER — Other Ambulatory Visit: Payer: Self-pay

## 2023-03-18 ENCOUNTER — Other Ambulatory Visit (HOSPITAL_BASED_OUTPATIENT_CLINIC_OR_DEPARTMENT_OTHER): Payer: Self-pay

## 2023-03-18 MED ORDER — LEVOTHYROXINE SODIUM 75 MCG PO TABS
75.0000 ug | ORAL_TABLET | Freq: Every day | ORAL | 1 refills | Status: DC
  Filled 2023-03-18: qty 90, 90d supply, fill #0
  Filled 2023-06-10: qty 90, 90d supply, fill #1

## 2023-03-19 ENCOUNTER — Other Ambulatory Visit (HOSPITAL_BASED_OUTPATIENT_CLINIC_OR_DEPARTMENT_OTHER): Payer: Self-pay

## 2023-03-19 DIAGNOSIS — F4312 Post-traumatic stress disorder, chronic: Secondary | ICD-10-CM | POA: Diagnosis not present

## 2023-03-19 DIAGNOSIS — F411 Generalized anxiety disorder: Secondary | ICD-10-CM | POA: Diagnosis not present

## 2023-03-19 MED ORDER — SERTRALINE HCL 100 MG PO TABS
200.0000 mg | ORAL_TABLET | Freq: Every morning | ORAL | 1 refills | Status: DC
  Filled 2023-03-19: qty 60, 30d supply, fill #0
  Filled 2023-05-20: qty 60, 30d supply, fill #1
  Filled 2023-09-10: qty 60, 30d supply, fill #2

## 2023-03-19 MED ORDER — ALPRAZOLAM 0.5 MG PO TABS
0.5000 mg | ORAL_TABLET | Freq: Every morning | ORAL | 0 refills | Status: DC
  Filled 2023-03-19: qty 30, 30d supply, fill #0
  Filled 2023-05-30: qty 30, 30d supply, fill #1

## 2023-03-22 ENCOUNTER — Telehealth (INDEPENDENT_AMBULATORY_CARE_PROVIDER_SITE_OTHER): Payer: Medicare PPO | Admitting: Internal Medicine

## 2023-03-22 DIAGNOSIS — L299 Pruritus, unspecified: Secondary | ICD-10-CM | POA: Diagnosis not present

## 2023-03-22 MED ORDER — TRIAMCINOLONE ACETONIDE 0.1 % EX CREA
1.0000 | TOPICAL_CREAM | Freq: Three times a day (TID) | CUTANEOUS | 0 refills | Status: DC
Start: 2023-03-22 — End: 2023-07-14

## 2023-03-22 NOTE — Telephone Encounter (Signed)
See details of phone conversationwhich is a telephone visit

## 2023-04-09 DIAGNOSIS — F4312 Post-traumatic stress disorder, chronic: Secondary | ICD-10-CM | POA: Diagnosis not present

## 2023-04-09 DIAGNOSIS — F411 Generalized anxiety disorder: Secondary | ICD-10-CM | POA: Diagnosis not present

## 2023-04-22 ENCOUNTER — Other Ambulatory Visit: Payer: Self-pay

## 2023-04-22 ENCOUNTER — Other Ambulatory Visit (HOSPITAL_BASED_OUTPATIENT_CLINIC_OR_DEPARTMENT_OTHER): Payer: Self-pay

## 2023-04-26 DIAGNOSIS — D0362 Melanoma in situ of left upper limb, including shoulder: Secondary | ICD-10-CM

## 2023-04-26 HISTORY — DX: Melanoma in situ of left upper limb, including shoulder: D03.62

## 2023-04-26 HISTORY — PX: MELANOMA EXCISION: SHX5266

## 2023-04-30 ENCOUNTER — Telehealth: Payer: Self-pay | Admitting: Internal Medicine

## 2023-04-30 NOTE — Telephone Encounter (Signed)
Patient called in with ongoing concerns for LLQ abdominal pain & loose stools. Denies fever, n/v. She was last seen for colon with Dr. Leone Payor in 02/2022 for the same symptoms, and now symptoms have returned over the last month. Described LLQ pain as nagging, worse post dinner (w/cramping) & rates pain scale 3/10. Stools are mostly loose (3-4 times/day) w/urgency and incontinence at times. She started a probiotic, and may have noticed a slight improvement. No longer taking bentyl as it does not provide any relief. Takes ibuprofen PRN. Patient would like Dr. Marvell Fuller recommendations. Scheduled her for OV with [place], PA 05/06/23 at 10:00 am to discuss further. Will route to MD.

## 2023-04-30 NOTE — Telephone Encounter (Signed)
Inbound call from patient states she has been experiencing diverticulitis. Patient would like a f/u call with a nurse. Please advise.  Thank you

## 2023-04-30 NOTE — Telephone Encounter (Signed)
I called her.  This is an aggravation of a chronic recurrent issue that might be due to [place] though she has been on it for years and it is episodic so not sure.  That medication helps her psoriasis so she will continue that.  I have told her to do the following:  #1 stop all artificial sweeteners she drinks a soda with artificial sweeteners she is to look for other potential dietary sources and stop  #2 use Imodium as needed  #3 we can cancel her July 11 appointment with Fredric Mare, and I will will have her call me in 1 month (she knows) and we will regroup after that.

## 2023-05-03 NOTE — Telephone Encounter (Signed)
July 11th Appointment canceled with Boone Master PA.

## 2023-05-04 ENCOUNTER — Other Ambulatory Visit (HOSPITAL_BASED_OUTPATIENT_CLINIC_OR_DEPARTMENT_OTHER): Payer: Self-pay

## 2023-05-04 DIAGNOSIS — L409 Psoriasis, unspecified: Secondary | ICD-10-CM | POA: Diagnosis not present

## 2023-05-04 DIAGNOSIS — L738 Other specified follicular disorders: Secondary | ICD-10-CM | POA: Diagnosis not present

## 2023-05-04 DIAGNOSIS — L720 Epidermal cyst: Secondary | ICD-10-CM | POA: Diagnosis not present

## 2023-05-04 DIAGNOSIS — L821 Other seborrheic keratosis: Secondary | ICD-10-CM | POA: Diagnosis not present

## 2023-05-04 DIAGNOSIS — D485 Neoplasm of uncertain behavior of skin: Secondary | ICD-10-CM | POA: Diagnosis not present

## 2023-05-04 DIAGNOSIS — M05719 Rheumatoid arthritis with rheumatoid factor of unspecified shoulder without organ or systems involvement: Secondary | ICD-10-CM | POA: Diagnosis not present

## 2023-05-04 DIAGNOSIS — L700 Acne vulgaris: Secondary | ICD-10-CM | POA: Diagnosis not present

## 2023-05-04 DIAGNOSIS — D239 Other benign neoplasm of skin, unspecified: Secondary | ICD-10-CM | POA: Diagnosis not present

## 2023-05-04 DIAGNOSIS — Z1283 Encounter for screening for malignant neoplasm of skin: Secondary | ICD-10-CM | POA: Diagnosis not present

## 2023-05-04 DIAGNOSIS — L814 Other melanin hyperpigmentation: Secondary | ICD-10-CM | POA: Diagnosis not present

## 2023-05-04 DIAGNOSIS — C4362 Malignant melanoma of left upper limb, including shoulder: Secondary | ICD-10-CM | POA: Diagnosis not present

## 2023-05-04 DIAGNOSIS — L400 Psoriasis vulgaris: Secondary | ICD-10-CM | POA: Diagnosis not present

## 2023-05-04 DIAGNOSIS — D2260 Melanocytic nevi of unspecified upper limb, including shoulder: Secondary | ICD-10-CM | POA: Diagnosis not present

## 2023-05-04 DIAGNOSIS — L578 Other skin changes due to chronic exposure to nonionizing radiation: Secondary | ICD-10-CM | POA: Diagnosis not present

## 2023-05-04 MED ORDER — HYDROQUINONE 4 % EX CREA
TOPICAL_CREAM | Freq: Two times a day (BID) | CUTANEOUS | 6 refills | Status: DC
  Filled 2023-05-04: qty 28.35, 30d supply, fill #0

## 2023-05-04 MED ORDER — TRETINOIN 0.05 % EX CREA
1.0000 "application " | TOPICAL_CREAM | Freq: Every day | CUTANEOUS | 6 refills | Status: DC
  Filled 2023-05-04: qty 45, 30d supply, fill #0

## 2023-05-04 MED ORDER — OTEZLA 30 MG PO TABS
30.0000 mg | ORAL_TABLET | Freq: Two times a day (BID) | ORAL | 11 refills | Status: DC
  Filled 2023-05-04 – 2023-05-30 (×3): qty 60, 30d supply, fill #0
  Filled 2023-07-02: qty 60, 30d supply, fill #1
  Filled 2023-08-03: qty 60, 30d supply, fill #2
  Filled 2023-09-06: qty 60, 30d supply, fill #3
  Filled 2023-10-12: qty 60, 30d supply, fill #4
  Filled 2023-11-15: qty 60, 30d supply, fill #5
  Filled 2023-12-09: qty 60, 30d supply, fill #6
  Filled 2024-01-20: qty 60, 30d supply, fill #7
  Filled 2024-02-22 (×2): qty 60, 30d supply, fill #8
  Filled 2024-03-27 (×2): qty 60, 30d supply, fill #9
  Filled 2024-04-25: qty 60, 30d supply, fill #10

## 2023-05-05 ENCOUNTER — Other Ambulatory Visit (HOSPITAL_BASED_OUTPATIENT_CLINIC_OR_DEPARTMENT_OTHER): Payer: Self-pay

## 2023-05-06 ENCOUNTER — Ambulatory Visit: Payer: Medicare PPO | Admitting: Gastroenterology

## 2023-05-13 ENCOUNTER — Other Ambulatory Visit (HOSPITAL_BASED_OUTPATIENT_CLINIC_OR_DEPARTMENT_OTHER): Payer: Self-pay

## 2023-05-18 DIAGNOSIS — C4362 Malignant melanoma of left upper limb, including shoulder: Secondary | ICD-10-CM | POA: Diagnosis not present

## 2023-05-20 ENCOUNTER — Other Ambulatory Visit (HOSPITAL_BASED_OUTPATIENT_CLINIC_OR_DEPARTMENT_OTHER): Payer: Self-pay

## 2023-05-21 ENCOUNTER — Other Ambulatory Visit (HOSPITAL_BASED_OUTPATIENT_CLINIC_OR_DEPARTMENT_OTHER): Payer: Self-pay

## 2023-05-26 DIAGNOSIS — F4312 Post-traumatic stress disorder, chronic: Secondary | ICD-10-CM | POA: Diagnosis not present

## 2023-05-26 DIAGNOSIS — F411 Generalized anxiety disorder: Secondary | ICD-10-CM | POA: Diagnosis not present

## 2023-05-31 ENCOUNTER — Other Ambulatory Visit (HOSPITAL_BASED_OUTPATIENT_CLINIC_OR_DEPARTMENT_OTHER): Payer: Self-pay

## 2023-05-31 ENCOUNTER — Other Ambulatory Visit: Payer: Self-pay

## 2023-06-10 ENCOUNTER — Other Ambulatory Visit: Payer: Self-pay

## 2023-06-14 ENCOUNTER — Other Ambulatory Visit: Payer: Self-pay

## 2023-06-22 ENCOUNTER — Telehealth: Payer: Self-pay | Admitting: Internal Medicine

## 2023-06-22 NOTE — Telephone Encounter (Signed)
Inbound call from patient stating that she wanted to follow up from her last call back in July regarding her diverticulitis. Patient is requesting a call to discuss if she needs an appointment. Patient is requesting a call from [facility]. Please advise.

## 2023-07-05 ENCOUNTER — Other Ambulatory Visit: Payer: Self-pay

## 2023-07-05 ENCOUNTER — Other Ambulatory Visit (HOSPITAL_BASED_OUTPATIENT_CLINIC_OR_DEPARTMENT_OTHER): Payer: Self-pay

## 2023-07-14 ENCOUNTER — Encounter: Payer: Self-pay | Admitting: Internal Medicine

## 2023-07-14 ENCOUNTER — Ambulatory Visit: Payer: Medicare PPO | Admitting: Internal Medicine

## 2023-07-14 VITALS — BP 132/80 | HR 66 | Ht 63.5 in | Wt 156.0 lb

## 2023-07-14 DIAGNOSIS — R159 Full incontinence of feces: Secondary | ICD-10-CM

## 2023-07-14 DIAGNOSIS — K589 Irritable bowel syndrome without diarrhea: Secondary | ICD-10-CM

## 2023-07-14 NOTE — Progress Notes (Signed)
Pamela Ingram 72 y.o. [birth year] [id number]  Assessment & Plan:   Encounter Diagnoses  Name Primary?   Full incontinence of feces Yes   Irritable bowel syndrome, unspecified type    Referral for pelvic floor physical therapy.  There is some decreased voluntary anal tone that I think is related to her incontinence.  I do think she has irritable bowel syndrome though the episodes are not frequent in which she gets the cramps and urgent defecation so hopefully the PT will solve this for her.  Return as needed at this point.  She is reassured that I do not think this represents a serious health problem.  There is a little bit of cancer phobia.  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  She had a negative colonoscopy last year with terminal ileum inspection and random colon biopsies that were normal.  I had thought maybe [facility] [place] was causing some issues but her rheumatologist says they do not really see that.  She had we contacted [place] earlier this year was having some steady loose stools and still gets some intermittent left lower quadrant discomfort or pain that is not a chronic problem for her.  However even though the consistent loose stools resolved she has episodes of cramps and urgent defecation and fecal incontinence.  This happens maybe once a month and it is entirely unpredictable.  It is not dietary or medication related as best we can tell.  The last episode was a few weeks ago.  She was out walking and got bad cramps and could not hold it and had to change her close.  She is under some stress with an issue with her oldest son and is going to counseling.  She does not use artificial sweeteners.  There is a rare stress urinary incontinence but no significant or consistent genitourinary issues.  She is status  post anterior posterior repair years ago. Allergies  Allergen Reactions   Amoxicillin Other (See Comments)    Pt gets yeast infection Has patient had a PCN reaction causing immediate rash, facial/tongue/throat swelling, SOB or lightheadedness with hypotension: No Has patient had a PCN reaction causing severe rash involving mucus membranes or skin necrosis: No Has patient had a PCN reaction that required hospitalization: No Has patient had a PCN reaction occurring within the last 10 years: No If all of the above answers are "NO", then may proceed with Cephalosporin use.    Current Meds  Medication Sig   ALPRAZolam (XANAX) 0.5 MG tablet Take 1 tablet (0.5 mg total) by mouth every morning as needed.   Apremilast (OTEZLA) 30 MG TABS Take 1 tablet (30 mg total) by mouth 2 (two) times daily.   hydroquinone 4 % cream Apply to affected dark areas twice daily. Stop if you notice darkening. Use sunscreen.   ibuprofen (ADVIL,MOTRIN) 200 MG tablet Take 800 mg by mouth every 8 (eight) hours as needed for moderate pain.   levothyroxine (SYNTHROID) 75 MCG tablet Take 1 tablet (75 mcg total) by mouth daily.   sertraline (ZOLOFT) 100 MG tablet Take 2 tablets (200 mg total) by mouth in the morning.   simvastatin (ZOCOR) 20 MG tablet Take 1 tablet (20 mg total) by mouth every evening.   Tocilizumab (ACTEMRA) 162 MG/0.9ML SOSY Inject 0.9 ml (1 syringe) subcutaneously every week   tretinoin (  RETIN-A) 0.05 % cream Apply a pea sized amount to entire face and neck daily at bedtime. Avoid use during the day. Can mix with 3 pea sized amounts of moisturizer to reduce irritation. Can start using a few times a week and increase to nightly use as tolerated. Use sunscreen daily and reapply if out in the sun for over 2 hours.   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 Former smoker.  Social alcohol consumption. EtOH 1-2 drinks/week. 3 caffeine/day. She is married.  2 adult sons.  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  Father died of complications of sepsis.  He had a stroke.  Mother died of respiratory complications and had history of rheumatoid arthritis.  1 sister living.  No brothers.   family history includes Diabetes in her paternal uncle; Emphysema in her mother; Hypertension in her father; Rheum arthritis in her mother; Stroke in her father.   Review of Systems See above She had a left upper arm melanoma excised at [facility] in July. Objective:   Physical Exam BP 132/80   Pulse 66   Ht 5' 3.5" (1.613 m)   Wt 156 lb (70.8 kg)   BMI 27.20 kg/m   Abdomen is soft nontender no organomegaly or mass  Rectal exam with Alberteen Sam, CMA present  Very small old external hemorrhoid/tag, otherwise normal anoderm. Resting anal tone seems normal but there is decreased voluntary anal tone/squeeze.  No rectocele.  No stool in the vault.  Simulated defecation shows appropriate abdominal contraction, appropriate descent and relaxation of anal sphincter.

## 2023-07-14 NOTE — Patient Instructions (Addendum)
_______________________________________________________  If your blood pressure at your visit was 140/90 or greater, please contact your primary care physician to follow up on this.  _______________________________________________________  If you are age 71 or older, your body mass index should be between 23-30. Your Body mass index is 27.2 kg/m. If this is out of the aforementioned range listed, please consider follow up with your Primary Care Provider.  If you are age 65 or younger, your body mass index should be between 19-25. Your Body mass index is 27.2 kg/m. If this is out of the aformentioned range listed, please consider follow up with your Primary Care Provider.   ________________________________________________________  The [facility] providers would like to encourage you to use [facility] to communicate with providers for non-urgent requests or questions.  Due to long hold times on the telephone, sending your provider a message by [facility] may be a faster and more efficient way to get a response.  Please allow 48 business hours for a response.  Please remember that this is for non-urgent requests.  _______________________________________________________  Bonita Quin have been referred to physical therapy. If you haven't heard from them in 2 weeks please reach out to them on the number printed below.  It was a pleasure to see you today!  Thank you for trusting me with your gastrointestinal care!

## 2023-07-28 ENCOUNTER — Other Ambulatory Visit (HOSPITAL_BASED_OUTPATIENT_CLINIC_OR_DEPARTMENT_OTHER): Payer: Self-pay

## 2023-07-28 MED ORDER — FLUAD 0.5 ML IM SUSY
PREFILLED_SYRINGE | INTRAMUSCULAR | 0 refills | Status: DC
  Filled 2023-07-28: qty 0.5, 1d supply, fill #0

## 2023-07-28 NOTE — Progress Notes (Shared)
Patient Care Team: Margaree Mackintosh, MD as PCP - General (Internal Medicine) Linna Darner, RD as Dietitian (Family Medicine)  Visit Date: 07/28/23  Subjective:    Patient ID: Pamela Ingram , Female   DOB: Mar 18, 1951, 72 y.o.    MRN: [id number]   72 y.o. Female presents today for a 65-month follow-up. History of anxiety, hyperlipidemia, melanoma in situ left upper extremity, hypothyroidism  History of anxiety and depression treated with alprazolam 0.5 mg daily as needed, sertraline 200 mg daily.  History of hyperlipidemia treated with simvastatin 20 mg daily.  History of hypothyroidism treated with levothyroxine 75 mcg daily.  History of IBS. Has not needed Bentyl recently.   Mammogram last completed 11/25/22. No mammographic evidence of malignancy. Recommended repeat in 2025.   Colonoscopy last completed 02/23/22. Diverticulosis in sigmoid colon. Examination otherwise normal. No recommendation at this time regarding repeat.  Past Medical History:  Diagnosis Date   Anxiety    Hyperlipidemia    Melanoma in situ of left upper extremity (HCC) 04/2023   Pleurisy with effusion    Rheumatoid arthritis(714.0)    Thyroid disease    hypothyroidism     Family History  Problem Relation Age of Onset   Emphysema Mother    Rheum arthritis Mother    Hypertension Father    Stroke Father    Diabetes Paternal Uncle    Colon cancer Neg Hx    Rectal cancer Neg Hx    Stomach cancer Neg Hx    Esophageal cancer Neg Hx    Cancer Neg Hx     Social History   Social History Narrative      Social:Does not smoke. Former smoker.  Social alcohol consumption. EtOH 1-2 drinks/week. 3 caffeine/day. She is married.  2 adult sons.  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  Father died of complications of sepsis.  He had a stroke.  Mother died of respiratory complications and had history of  rheumatoid arthritis.  1 sister living.  No brothers.      ROS      Objective:   Vitals: There were no vitals taken for this visit.   Physical Exam    Results:   Studies obtained and personally reviewed by me:  Imaging, colonoscopy, mammogram, bone density scan, echocardiogram, heart cath, stress test, CT calcium score, etc. ***   Labs:       Component Value Date/Time   NA 140 01/19/2023 0944   NA 142 05/31/2017 0907   K 4.3 01/19/2023 0944   CL 104 01/19/2023 0944   CO2 28 01/19/2023 0944   GLUCOSE 86 01/19/2023 0944   BUN 21 01/19/2023 0944   BUN 21 05/31/2017 0907   CREATININE 0.94 01/19/2023 0944   CALCIUM 9.6 01/19/2023 0944   PROT 7.2 01/19/2023 0944   ALBUMIN 4.3 11/01/2021 1253   AST 23 01/19/2023 0944   ALT 17 01/19/2023 0944   ALKPHOS 55 11/01/2021 1253   BILITOT 0.5 01/19/2023 0944   GFRNONAA >60 11/01/2021 1253   GFRNONAA 79 12/05/2020 1122   GFRAA 92 12/05/2020 1122     Lab Results  Component Value Date   WBC 5.9 01/19/2023   HGB 14.0 01/19/2023   HCT 41.8 01/19/2023   MCV 87.1 01/19/2023   PLT 230 01/19/2023    Lab Results  Component Value Date   CHOL 244 (H)  01/19/2023   HDL 82 01/19/2023   LDLCALC 144 (H) 01/19/2023   TRIG 79 01/19/2023   CHOLHDL 3.0 01/19/2023    No results found for: "HGBA1C"   Lab Results  Component Value Date   TSH 1.17 01/19/2023     No results found for: "PSA1", "PSA" *** delete for female pts  ***    Assessment & Plan:   ***    I,Alexander Ruley,acting as a scribe for Margaree Mackintosh, MD.,have documented all relevant documentation on the behalf of Margaree Mackintosh, MD,as directed by  Margaree Mackintosh, MD while in the presence of Margaree Mackintosh, MD.   ***

## 2023-07-29 DIAGNOSIS — F411 Generalized anxiety disorder: Secondary | ICD-10-CM | POA: Diagnosis not present

## 2023-07-29 DIAGNOSIS — F4312 Post-traumatic stress disorder, chronic: Secondary | ICD-10-CM | POA: Diagnosis not present

## 2023-08-03 ENCOUNTER — Other Ambulatory Visit: Payer: Medicare PPO

## 2023-08-03 ENCOUNTER — Other Ambulatory Visit: Payer: Self-pay

## 2023-08-03 ENCOUNTER — Other Ambulatory Visit (HOSPITAL_BASED_OUTPATIENT_CLINIC_OR_DEPARTMENT_OTHER): Payer: Self-pay | Admitting: Internal Medicine

## 2023-08-03 DIAGNOSIS — E039 Hypothyroidism, unspecified: Secondary | ICD-10-CM

## 2023-08-03 DIAGNOSIS — E7800 Pure hypercholesterolemia, unspecified: Secondary | ICD-10-CM

## 2023-08-03 DIAGNOSIS — Z1231 Encounter for screening mammogram for malignant neoplasm of breast: Secondary | ICD-10-CM

## 2023-08-05 ENCOUNTER — Ambulatory Visit: Payer: Medicare PPO | Admitting: Internal Medicine

## 2023-08-05 NOTE — Progress Notes (Signed)
Patient Care Team: Margaree Mackintosh, MD as PCP - General (Internal Medicine) Linna Darner, RD as Dietitian (Family Medicine)  Visit Date: 08/12/23  Subjective:    Patient ID: Pamela Ingram , Female   DOB: May 01, 1951, 72 y.o.    MRN: [id number]   72 y.o. Female presents today for a 8-month follow-up. History of anxiety, hyperlipidemia, Rheumatoid arthritis, thyroid disease.Also has hypothyroidism and hyperlipidemia.   History of anxiety and depression treated with Xanax 0.5 mg daily in the morning as needed, Zoloft 200 mg daily in the morning. She goes to talk therapy monthly. Reports she has been stressed recently but says this is normal for her. Seems to be managing well.   History of hypothyroidism treated with Synthroid 75 mcg daily. TSH at 1.68 which is normal.   History of hyperlipidemia treated with Zocor 20 mg daily in the evening. CHOL elevated at 238 and LDL elevated at 145 on 08/10/23, compared to 244, 144 on 01/19/23. She exercises regularly. 06/30/22 coronary calcium score at 0. Liver functions normal. Advised to watch diet a bit. Continue same dose and follow up in 6 months.   History of IBS. She has not needed Bentyl.   Mammogram last completed 11/25/22. No mammographic evidence of malignancy. Recommended repeat in 2025.   Colonoscopy last completed 02/23/22. Diverticulosis in sigmoid colon. Examination otherwise normal. No recommendation at this time regarding repeat.  Past Medical History:  Diagnosis Date   Anxiety    Hyperlipidemia    Melanoma in situ of left upper extremity (HCC) 04/2023   Pleurisy with effusion    Rheumatoid arthritis(714.0)    Thyroid disease    hypothyroidism     Family History  Problem Relation Age of Onset   Emphysema Mother    Rheum arthritis Mother    Hypertension Father    Stroke Father    Diabetes Paternal Uncle    Colon cancer Neg Hx    Rectal cancer Neg Hx    Stomach cancer Neg Hx    Esophageal cancer Neg Hx    Cancer Neg Hx      Social History   Social History Narrative      Social:Does not smoke. Former smoker.  Social alcohol consumption. EtOH 1-2 drinks/week. 3 caffeine/day. She is married.  2 adult sons.  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  Father died of complications of sepsis.  He had a stroke.  Mother died of respiratory complications and had history of rheumatoid arthritis.  1 sister living.  No brothers.      Review of Systems  Constitutional:  Negative for fever and malaise/fatigue.  HENT:  Negative for congestion.   Eyes:  Negative for blurred vision.  Respiratory:  Negative for cough and shortness of breath.   Cardiovascular:  Negative for chest pain, palpitations and leg swelling.  Gastrointestinal:  Negative for vomiting.  Musculoskeletal:  Negative for back pain.  Skin:  Negative for rash.  Neurological:  Negative for loss of consciousness and headaches.  Psychiatric/Behavioral:  The patient is nervous/anxious.         Objective:   Vitals: BP 130/84   Pulse 74   Ht 5' 3.5" (1.613 m)   SpO2 96%   BMI 27.20 kg/m    Physical Exam Vitals and nursing note reviewed.  Constitutional:      General: She is not in  acute distress.    Appearance: Normal appearance. She is not toxic-appearing.  HENT:     Head: Normocephalic and atraumatic.  Cardiovascular:     Rate and Rhythm: Normal rate and regular rhythm. No extrasystoles are present.    Pulses: Normal pulses.     Heart sounds: Normal heart sounds. No murmur heard.    No friction rub. No gallop.  Pulmonary:     Effort: Pulmonary effort is normal. No respiratory distress.     Breath sounds: Normal breath sounds. No wheezing or rales.  Skin:    General: Skin is warm and dry.  Neurological:     Mental Status: She is alert and oriented to person, place, and time. Mental status is at baseline.  Psychiatric:         Mood and Affect: Mood normal.        Behavior: Behavior normal.        Thought Content: Thought content normal.        Judgment: Judgment normal.       Results:   Studies obtained and personally reviewed by me:  Mammogram last completed 11/25/22. No mammographic evidence of malignancy. Recommended repeat in 2025.   Colonoscopy last completed 02/23/22. Diverticulosis in sigmoid colon. Examination otherwise normal. No recommendation at this time regarding repeat.  06/30/22 coronary calcium score at 0.  Labs:       Component Value Date/Time   NA 140 01/19/2023 0944   NA 142 05/31/2017 0907   K 4.3 01/19/2023 0944   CL 104 01/19/2023 0944   CO2 28 01/19/2023 0944   GLUCOSE 86 01/19/2023 0944   BUN 21 01/19/2023 0944   BUN 21 05/31/2017 0907   CREATININE 0.94 01/19/2023 0944   CALCIUM 9.6 01/19/2023 0944   PROT 7.2 08/10/2023 1126   ALBUMIN 4.3 11/01/2021 1253   AST 17 08/10/2023 1126   ALT 9 08/10/2023 1126   ALKPHOS 55 11/01/2021 1253   BILITOT 0.6 08/10/2023 1126   GFRNONAA >60 11/01/2021 1253   GFRNONAA 79 12/05/2020 1122   GFRAA 92 12/05/2020 1122     Lab Results  Component Value Date   WBC 5.9 01/19/2023   HGB 14.0 01/19/2023   HCT 41.8 01/19/2023   MCV 87.1 01/19/2023   PLT 230 01/19/2023    Lab Results  Component Value Date   CHOL 238 (H) 08/10/2023   HDL 70 08/10/2023   LDLCALC 145 (H) 08/10/2023   TRIG 113 08/10/2023   CHOLHDL 3.4 08/10/2023    No results found for: "HGBA1C"   Lab Results  Component Value Date   TSH 1.68 08/10/2023      Assessment & Plan:   Anxiety and depression: stable with Xanax 0.5 mg daily in the morning as needed, Zoloft 200 mg daily in the morning. She goes to talk therapy monthly. Situational stress discussed.   Hypothyroidism: treated with Synthroid 75 mcg daily. TSH at 1.68.   Hyperlipidemia: treated with Zocor 20 mg daily in the evening. CHOL elevated at 238 and LDL elevated at 145 on 08/10/23, compared to 244, 144  on 01/19/23. She exercises regularly. 06/30/22 coronary calcium score at 0. Referral to Dr. Royann Shivers for further direction regarding lipid management. With coronary calcium score of 0 this is reassuring.   IBS: this is currently stable.    Mammogram last completed 11/25/22. No mammographic evidence of malignancy. Recommended repeat in 2025.   Colonoscopy last completed 02/23/22. Diverticulosis in sigmoid colon. Examination otherwise normal. No recommendation at this time regarding  repeat.  Return in 6 months for health maintenance exam or as needed.    I,Alexander Ruley,acting as a Neurosurgeon for Margaree Mackintosh, MD.,have documented all relevant documentation on the behalf of Margaree Mackintosh, MD,as directed by  Margaree Mackintosh, MD while in the presence of Margaree Mackintosh, MD.   I, Margaree Mackintosh, MD, have reviewed all documentation for this visit. The documentation on 08/23/23 for the exam, diagnosis, procedures, and orders are all accurate and complete.

## 2023-08-10 ENCOUNTER — Other Ambulatory Visit: Payer: Medicare PPO

## 2023-08-10 DIAGNOSIS — E7800 Pure hypercholesterolemia, unspecified: Secondary | ICD-10-CM | POA: Diagnosis not present

## 2023-08-10 DIAGNOSIS — E039 Hypothyroidism, unspecified: Secondary | ICD-10-CM | POA: Diagnosis not present

## 2023-08-11 LAB — LIPID PANEL
Cholesterol: 238 mg/dL — ABNORMAL HIGH (ref <200)
HDL: 70 mg/dL (ref >=50)
LDL Cholesterol (Calc): 145 mg/dL — ABNORMAL HIGH
Non-HDL Cholesterol (Calc): 168 mg/dL — ABNORMAL HIGH (ref <130)
Total CHOL/HDL Ratio: 3.4 (calc) (ref <5.0)
Triglycerides: 113 mg/dL (ref <150)

## 2023-08-11 LAB — HEPATIC FUNCTION PANEL
AG Ratio: 1.7 (calc) (ref 1.0–2.5)
ALT: 9 U/L (ref 6–29)
AST: 17 U/L (ref 10–35)
Albumin: 4.5 g/dL (ref 3.6–5.1)
Alkaline phosphatase (APISO): 69 U/L (ref 37–153)
Bilirubin, Direct: 0.1 mg/dL (ref 0.0–0.2)
Globulin: 2.7 g/dL (ref 1.9–3.7)
Indirect Bilirubin: 0.5 mg/dL (ref 0.2–1.2)
Total Bilirubin: 0.6 mg/dL (ref 0.2–1.2)
Total Protein: 7.2 g/dL (ref 6.1–8.1)

## 2023-08-11 LAB — TSH: TSH: 1.68 m[IU]/L (ref 0.40–4.50)

## 2023-08-12 ENCOUNTER — Ambulatory Visit: Payer: Medicare PPO | Admitting: Internal Medicine

## 2023-08-12 ENCOUNTER — Encounter: Payer: Self-pay | Admitting: Internal Medicine

## 2023-08-12 VITALS — BP 130/84 | HR 74 | Ht 63.5 in

## 2023-08-12 DIAGNOSIS — E7800 Pure hypercholesterolemia, unspecified: Secondary | ICD-10-CM

## 2023-08-12 DIAGNOSIS — F32A Depression, unspecified: Secondary | ICD-10-CM

## 2023-08-12 DIAGNOSIS — M05741 Rheumatoid arthritis with rheumatoid factor of right hand without organ or systems involvement: Secondary | ICD-10-CM

## 2023-08-12 DIAGNOSIS — F419 Anxiety disorder, unspecified: Secondary | ICD-10-CM

## 2023-08-12 DIAGNOSIS — M05742 Rheumatoid arthritis with rheumatoid factor of left hand without organ or systems involvement: Secondary | ICD-10-CM | POA: Diagnosis not present

## 2023-08-12 DIAGNOSIS — F439 Reaction to severe stress, unspecified: Secondary | ICD-10-CM | POA: Diagnosis not present

## 2023-08-12 DIAGNOSIS — E039 Hypothyroidism, unspecified: Secondary | ICD-10-CM | POA: Diagnosis not present

## 2023-08-15 NOTE — Therapy (Incomplete)
OUTPATIENT PHYSICAL THERAPY FEMALE PELVIC EVALUATION   Patient Name: Pamela Ingram MRN: [id number] DOB:[birth year], 72 y.o., female Today's Date: 08/17/2023  END OF SESSION:  PT End of Session - 08/16/23 1509     Visit Number 1    Authorization Type [employer]    Authorization Time Period [employer] Approved 8 visits-08/16/2023-10/11/2023- [id number]#[id number]  [employer] MCR-[id number]    PT Start Time 1100    PT Stop Time 1145    PT Time Calculation (min) 45 min    Activity Tolerance Patient tolerated treatment well    Behavior During Therapy WFL for tasks assessed/performed             Past Medical History:  Diagnosis Date   Anxiety    Hyperlipidemia    Melanoma in situ of left upper extremity (HCC) 04/2023   Pleurisy with effusion    Rheumatoid arthritis(714.0)    Thyroid disease    hypothyroidism   Past Surgical History:  Procedure Laterality Date   COLONOSCOPY     LAPAROSCOPY     LEFT HEART CATH AND CORONARY ANGIOGRAPHY N/A 06/04/2017   Procedure: Left Heart Cath and Coronary Angiography;  Surgeon: Tonny Bollman, MD;  Location: 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   Anxiety 05/29/2016   Esophageal reflux 05/29/2016   Osteopenia 05/29/2016   Lichen sclerosus et atrophicus 07/24/2015   Overweight (BMI 25.0-29.9) 03/13/2013   Rheumatoid arthritis (HCC) 08/03/2011   Rheumatoid arthritis (HCC) 10/26/2008   Hyperlipemia 10/27/2007   Hypothyroid 10/27/2003   Depression with anxiety 10/26/1993    PCP:  Margaree Mackintosh, MD PCP - General  REFERRING PROVIDER:  Iva Boop, MD Ref Provider  REFERRING DIAG: R15.9 (ICD-10-CM) - Full incontinence of feces  THERAPY DIAG:   Other lack of coordination - Plan: PT plan of care cert/re-cert  Muscle weakness (generalized) - Plan: PT plan of care cert/re-cert  Rationale for Evaluation and Treatment: Rehabilitation  ONSET DATE: 07/2022  SUBJECTIVE:                                                                                                                                                                                           SUBJECTIVE STATEMENT: Pt reports that over the last year she does not even know she leaks a little bit of stool, cannot hold it  in at times, it's really explosive. Sometimes her stomach does not feel right, makes her nervous that she will have an accident. Rectal exam was horrible, so embarrassing. She is not surprised that her rectal muscles were weak. Years ago she had a vaginal hysterectomy, they told her her rectum was prolapsing them. She thinks maybe it was rectocele repair.   Upset stomach- sometimes when she goes to the bathroom it's very lose, sometimes normal, used to be normal. Has a lot of Bristol stool scale type 7 and gas.  Reports that she has a bad eating disorder, sometimes eats a lot. She is ashamed to talk about it.    Fluid intake: No   PAIN:  Are you having pain? Yes- LLQ has been going on for 2 years. , that is where the pain is NPRS scale: 6/10 Pain location: LLQ  Pain type: sharp Pain description: intermittent   Aggravating factors: does not know Relieving factors: not eating  PRECAUTIONS: None  RED FLAGS: None   WEIGHT BEARING RESTRICTIONS: No  FALLS:  Has patient fallen in last 6 months? No  LIVING ENVIRONMENT: Lives with: lives with their spouse Lives in: House/apartment Stairs: no Has following equipment at home:  no  OCCUPATION: retired  PLOF: Independent  PATIENT GOALS: get these stools controlled and not have incontinence  PERTINENT HISTORY:  hysterectomy Sexual abuse: No  BOWEL MOVEMENT: Pain with bowel movement: No Type of  bowel movement:Type (Bristol Stool Scale)   Fully empty rectum: Yes: but sometimes  feels when she gets up that she has to sit back down Leakage: Yes: loose stools Pads: No but thought about it Fiber supplement: No  URINATION: Pain with urination: No Fully empty bladder: No feels like she has a little retention Stream: Strong Urgency: Yes: sometimes, bad aboy going , she does not want to stop what she is doing Frequency: yes Leakage:  when she gets up from the toilet, some with laughing Pads: No  INTERCOURSE: Pain with intercourse:  to be asked Ability to have vaginal penetration:   Climax:  Marinoff Scale: /3  PREGNANCY: Vaginal deliveries 2 Tearing Yes:   C-section deliveries 0 Currently pregnant No  PROLAPSE: pt reported a hx of rectocele repair, but was not sure None   OBJECTIVE:  Note: Objective measures were completed at Evaluation unless otherwise noted.    COGNITION: Overall cognitive status: Within functional limits for tasks assessed     SENSATION: Light touch: Appears intact Proprioception: Appears intact  MUSCLE LENGTH: Hamstrings: Right 80 deg; Left 90 deg     POSTURE: No Significant postural limitations  Upper chest breathing   PELVIC ALIGNMENT: within functional limitations   LUMBARAROM/PROM: seems within functional limitations   A/PROM A/PROM  eval  Flexion   Extension   Right lateral flexion   Left lateral flexion   Right rotation   Left rotation    (Blank rows = not tested)  LOWER EXTREMITY ROM:   Passive ROM Right eval Left eval  Hip flexion    Hip extension    Hip abduction    Hip adduction    Hip internal rotation Limited limited  Hip external rotation limited limited  Knee flexion    Knee extension    Ankle dorsiflexion    Ankle plantarflexion    Ankle inversion    Ankle eversion     (Blank rows = not tested)  LOWER EXTREMITY MMT:  MMT Right eval Left eval  Hip flexion    Hip extension  Hip  abduction    Hip adduction    Hip internal rotation    Hip external rotation    Knee flexion    Knee extension    Ankle dorsiflexion    Ankle plantarflexion    Ankle inversion    Ankle eversion     PALPATION:   General  tender spot rectus abdominis on L Restrictions throughout abdomen                External Perineal Exam to be assessed- palpated today through clothing                             Internal Pelvic Floor to be assessed  Patient confirms identification and approves PT to assess internal pelvic floor and treatment Yes  PELVIC MMT:   MMT eval  Vaginal   Internal Anal Sphincter   External Anal Sphincter   Puborectalis   Diastasis Recti   (Blank rows = not tested)        TONE: To be assessed  PROLAPSE: To be assessed  TODAY'S TREATMENT:                                                                                                                              DATE: 08/17/23   EVAL see below   PATIENT EDUCATION:  Education details: abdominal massage education, food/ bowel diary, diaphragmatic breathing, pelvic floor relevant anatomy, HEP education Person educated: Patient Education method: Explanation, Demonstration, and Handouts Education comprehension: verbalized understanding, returned demonstration, verbal cues required, tactile cues required, and needs further education  HOME EXERCISE PROGRAM:  [phone]  ASSESSMENT:  CLINICAL IMPRESSION: Patient is a 72 y.o. F who was seen today for physical therapy evaluation and treatment for fecal incontinence. She reported that she has diarrhea a lot and it depends on what she eats. She has a eating disorder that she is ashamed to talk about but it will give her diarrhea. Discussed keeping food and bowel diary to start with and initiated pelvic floor and core exercises. Pt likely presents with some pelvic floor weakness ( was not sure is she had a rectocele repair in the past) and will benefit from pelvic PT to  reduce FI and improve QOL  OBJECTIVE IMPAIRMENTS: decreased coordination, decreased ROM, decreased strength, increased fascial restrictions, impaired tone, and pain.   ACTIVITY LIMITATIONS: continence and toileting  PARTICIPATION LIMITATIONS: community activity  PERSONAL FACTORS: Age and Time since onset of injury/illness/exacerbation are also affecting patient's functional outcome.   REHAB POTENTIAL: Good  CLINICAL DECISION MAKING: Stable/uncomplicated  EVALUATION COMPLEXITY: Low   GOALS: Goals reviewed with patient? Yes  SHORT TERM GOALS: Target date: 09/13/2023    Pt will be independent with HEP.   Baseline: Goal status: INITIAL  2.  Pt will report improved emptying with BMs in order to reduce FI accidents Baseline:  Goal status: INITIAL  3.  Pt will be  I with abdominal massage Baseline:  Goal status: INITIAL   LONG TERM GOALS: Target date: 10/11/2023    Pt will be independent with advanced HEP.   Baseline:  Goal status: INITIAL  2.  Pt will report improved stool consistency- bristol stool scale 3-4 d/t improved more regular eating habits to reduce fecal incontinence accidents Baseline:  Goal status: INITIAL  3.  Pt will demonstrate good pelvic floor strength at least 4/5 to reduce urgency and fecal accidents Baseline:  Goal status: INITIAL    PLAN:  PT FREQUENCY: 1-2x/week  PT DURATION: 8 weeks  PLANNED INTERVENTIONS: 97110-Therapeutic exercises, 97530- Therapeutic activity, 97112- Neuromuscular re-education, 97535- Self Care, 93235- Manual therapy, Dry Needling, Joint mobilization, Joint manipulation, Spinal mobilization, Scar mobilization, and Biofeedback  PLAN FOR NEXT SESSION: internal manual assessment   Rustyn Conery, PT 08/17/2023, 8:42 AM

## 2023-08-16 ENCOUNTER — Other Ambulatory Visit: Payer: Self-pay

## 2023-08-16 ENCOUNTER — Ambulatory Visit: Payer: Medicare PPO | Attending: Internal Medicine | Admitting: Physical Therapy

## 2023-08-16 ENCOUNTER — Encounter: Payer: Self-pay | Admitting: Physical Therapy

## 2023-08-16 DIAGNOSIS — M6281 Muscle weakness (generalized): Secondary | ICD-10-CM | POA: Diagnosis not present

## 2023-08-16 DIAGNOSIS — R278 Other lack of coordination: Secondary | ICD-10-CM | POA: Diagnosis not present

## 2023-08-17 DIAGNOSIS — F411 Generalized anxiety disorder: Secondary | ICD-10-CM | POA: Diagnosis not present

## 2023-08-18 ENCOUNTER — Other Ambulatory Visit: Payer: Self-pay

## 2023-08-23 DIAGNOSIS — Z1283 Encounter for screening for malignant neoplasm of skin: Secondary | ICD-10-CM | POA: Diagnosis not present

## 2023-08-23 DIAGNOSIS — Z85820 Personal history of malignant melanoma of skin: Secondary | ICD-10-CM | POA: Diagnosis not present

## 2023-08-23 DIAGNOSIS — L400 Psoriasis vulgaris: Secondary | ICD-10-CM | POA: Diagnosis not present

## 2023-08-23 DIAGNOSIS — L578 Other skin changes due to chronic exposure to nonionizing radiation: Secondary | ICD-10-CM | POA: Diagnosis not present

## 2023-08-23 DIAGNOSIS — D239 Other benign neoplasm of skin, unspecified: Secondary | ICD-10-CM | POA: Diagnosis not present

## 2023-08-23 DIAGNOSIS — L409 Psoriasis, unspecified: Secondary | ICD-10-CM | POA: Diagnosis not present

## 2023-08-23 DIAGNOSIS — L814 Other melanin hyperpigmentation: Secondary | ICD-10-CM | POA: Diagnosis not present

## 2023-08-23 DIAGNOSIS — D2260 Melanocytic nevi of unspecified upper limb, including shoulder: Secondary | ICD-10-CM | POA: Diagnosis not present

## 2023-08-23 DIAGNOSIS — M05719 Rheumatoid arthritis with rheumatoid factor of unspecified shoulder without organ or systems involvement: Secondary | ICD-10-CM | POA: Diagnosis not present

## 2023-08-23 NOTE — Patient Instructions (Signed)
Patient has elevated LDL cholesterol on Zocor 20 mg daily.  She has a coronary calcium score of 0 which is reassuring.  She would like some advice on further lipid management.  We could increase Zocor to 40 mg daily but that may cause some myalgias and she already suffers from rheumatoid arthritis.  We have discussed this today at length and she would like to see cardiologist for further direction with lipid management.  In particular she would like to see Dr. Royann Shivers.  Anxiety and depression are stable.  Irritable bowel syndrome is stable.  Return in 6 months for health maintenance exam or as needed.

## 2023-08-24 ENCOUNTER — Ambulatory Visit: Payer: Medicare PPO | Admitting: Physical Therapy

## 2023-09-03 ENCOUNTER — Ambulatory Visit: Payer: Medicare PPO | Attending: Internal Medicine | Admitting: Physical Therapy

## 2023-09-03 DIAGNOSIS — M6281 Muscle weakness (generalized): Secondary | ICD-10-CM | POA: Diagnosis not present

## 2023-09-03 DIAGNOSIS — R278 Other lack of coordination: Secondary | ICD-10-CM | POA: Diagnosis not present

## 2023-09-03 NOTE — Therapy (Signed)
OUTPATIENT PHYSICAL THERAPY FEMALE PELVIC EVALUATION   Patient Name: Pamela Ingram MRN: [id number] DOB:1951/08/21, 72 y.o., female Today's Date: 09/03/2023  END OF SESSION:  PT End of Session - 09/03/23 1159     Visit Number 2    Authorization Type [employer]    Authorization Time Period [employer] Approved 8 visits-08/16/2023-10/11/2023- [id number]  [employer] [id number]    PT Start Time 1015    PT Stop Time 1100    PT Time Calculation (min) 45 min    Activity Tolerance Patient tolerated treatment well    Behavior During Therapy WFL for tasks assessed/performed              Past Medical History:  Diagnosis Date   Anxiety    Hyperlipidemia    Melanoma in situ of left upper extremity (HCC) 04/2023   Pleurisy with effusion    Rheumatoid arthritis(714.0)    Thyroid disease    hypothyroidism   Past Surgical History:  Procedure Laterality Date   COLONOSCOPY     LAPAROSCOPY     LEFT HEART CATH AND CORONARY ANGIOGRAPHY N/A 06/04/2017   Procedure: Left Heart Cath and Coronary Angiography;  Surgeon: Tonny Bollman, MD;  Location: [facility] INVASIVE CV LAB;  Service: Cardiovascular;  Laterality: N/A;   MELANOMA EXCISION  04/2023   TUBAL LIGATION     VAGINAL HYSTERECTOMY  10/26/1993   Patient Active Problem List   Diagnosis Date Noted   Allergic angioedema 10/07/2020   Angioedema of lips 10/07/2020   Rash 10/07/2020   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   Anxiety 05/29/2016   Esophageal reflux 05/29/2016   Osteopenia 05/29/2016   Lichen sclerosus et atrophicus 07/24/2015   Overweight (BMI 25.0-29.9) 03/13/2013   Rheumatoid arthritis (HCC) 08/03/2011   Rheumatoid arthritis (HCC) 10/26/2008   Hyperlipemia 10/27/2007   Hypothyroid 10/27/2003   Depression with anxiety 10/26/1993    PCP:  Margaree Mackintosh, MD PCP - General  REFERRING PROVIDER:  Iva Boop, MD Ref Provider  REFERRING DIAG: R15.9 (ICD-10-CM) - Full incontinence of feces  THERAPY DIAG:   Other lack of coordination  Muscle weakness (generalized)  Rationale for Evaluation and Treatment: Rehabilitation  ONSET DATE: 07/2022  SUBJECTIVE:    Pt reports that she has had a couple of episodes of explosive diarrhea, she has felt her rectum filling up with liquid, feels movement in her abdomen after  she eats Pt feels like she would like to do her exercises at home. She is busy and pulled in too many directions. She has not been able to do her exercises very much. Her cat was sick and her elderly friend needs rides from her. Tried to do her bowel diary for a few days. Not taking fiber supplement, thinks she might try it. Thinks they recommended it in the past, She is a retired Engineer, civil (consulting), the worst patient.  SUBJECTIVE STATEMENT: Pt reports that over the last year she does not even know she leaks a little bit of stool, cannot hold it in at times, it's really explosive. Sometimes her stomach does not feel right, makes her nervous that she will have an accident. Rectal exam was horrible, so embarrassing. She is not surprised that her rectal muscles were weak. Years ago she had a vaginal hysterectomy, they told her her rectum was prolapsing them. She thinks maybe it was rectocele repair.   Upset stomach- sometimes when she goes to the bathroom it's very lose, sometimes normal, used to be normal. Has a lot of Bristol stool scale type 7 and gas.  Reports that she has a bad eating disorder, sometimes eats a lot. She is ashamed to talk about it.    Fluid intake: No   PAIN:  Are you having pain? Yes- LLQ has been going on for 2 years, that is where the pain is NPRS scale: 6/10 Pain location: LLQ  Pain type: sharp Pain description: intermittent   Aggravating factors: does not know Relieving factors: not  eating  PRECAUTIONS: None  RED FLAGS: None   WEIGHT BEARING RESTRICTIONS: No  FALLS:  Has patient fallen in last 6 months? No  LIVING ENVIRONMENT: Lives with: lives with their spouse Lives in: House/apartment Stairs: no Has following equipment at home:  no  OCCUPATION: retired  PLOF: Independent  PATIENT GOALS: get these stools controlled and not have incontinence  PERTINENT HISTORY:  hysterectomy Sexual abuse: No  BOWEL MOVEMENT: Pain with bowel movement: No Type of bowel movement:Type (Bristol Stool Scale)   Fully empty rectum: Yes: but sometimes  feels when she gets up that she has to sit back down Leakage: Yes: loose stools Pads: No but thought about it Fiber supplement: No  URINATION: Pain with urination: No Fully empty bladder: No feels like she has a little retention Stream: Strong Urgency: Yes: sometimes, bad aboy going , she does not want to stop what she is doing Frequency: yes Leakage:  when she gets up from the toilet, some with laughing Pads: No  INTERCOURSE: Pain with intercourse:  to be asked Ability to have vaginal penetration:   Climax:  Marinoff Scale: /3  PREGNANCY: Vaginal deliveries 2 Tearing Yes:   C-section deliveries 0 Currently pregnant No  PROLAPSE: pt reported a hx of rectocele repair, but was not sure None   OBJECTIVE:  Note: Objective measures were completed at Evaluation unless otherwise noted.    COGNITION: Overall cognitive status: Within functional limits for tasks assessed     SENSATION: Light touch: Appears intact Proprioception: Appears intact  MUSCLE LENGTH: Hamstrings: Right 80 deg; Left 90 deg     POSTURE: No Significant postural limitations  Upper chest breathing   PELVIC ALIGNMENT: within functional limitations   LUMBARAROM/PROM: seems within functional limitations    LOWER EXTREMITY ROM:   Passive ROM Right eval Left eval  Hip flexion Within functional limitations  Within  functional limitations   Hip extension    Hip abduction    Hip adduction    Hip internal rotation Limited limited  Hip external rotation limited limited  Knee flexion    Knee extension    Ankle dorsiflexion    Ankle plantarflexion    Ankle inversion    Ankle eversion     (Blank rows = not tested)  LOWER EXTREMITY MMT: grossly within functional limitations  PALPATION:   General  tender spot rectus abdominis on L Restrictions throughout abdomen  External Perineal Exam -within functional limitations                Internal Pelvic Floor - see below  Patient confirms identification and approves PT to assess internal pelvic floor and treatment Yes  PELVIC MMT:   MMT eval  Vaginal 2/5  Internal Anal Sphincter 4/5  External Anal Sphincter 4/5  Puborectalis   Diastasis Recti no  (Blank rows = not tested)        TONE: Good rectally PROLAPSE:anterior vaginal wall laxity  TODAY'S TREATMENT:                                                                                                                              DATE: 09/03/23   EVAL see below Manual- pelvic floor muscle assessment   Therapeutic activities- education of fiber supplement, food and bowel diary, fiber rich healthy foods, potential irirtants, HEP   PATIENT EDUCATION:  Education details: abdominal massage education, food/ bowel diary, diaphragmatic breathing, pelvic floor relevant anatomy, HEP education Person educated: Patient Education method: Explanation, Demonstration, and Handouts Education comprehension: verbalized understanding, returned demonstration, verbal cues required, tactile cues required, and needs further education  HOME EXERCISE PROGRAM:  [phone]  ASSESSMENT:  CLINICAL IMPRESSION: Tx focus- internal pelvic floor muscle assessment and education - see above. He anal sphincter seem strong, some weakness and anterior vaginal wall laxity noticed. Pt would benefit from PT, however  at this time pt does not feel like she can commit to coming to PT. Might try later.     Patient is a 72 y.o. F who was seen today for physical therapy evaluation and treatment for fecal incontinence. She reported that she has diarrhea a lot and it depends on what she eats. She has a eating disorder that she is ashamed to talk about but it will give her diarrhea. Discussed keeping food and bowel diary to start with and initiated pelvic floor and core exercises. Pt likely presents with some pelvic floor weakness ( was not sure is she had a rectocele repair in the past) and will benefit from pelvic PT to reduce FI and improve QOL  OBJECTIVE IMPAIRMENTS: decreased coordination, decreased ROM, decreased strength, increased fascial restrictions, impaired tone, and pain.   ACTIVITY LIMITATIONS: continence and toileting  PARTICIPATION LIMITATIONS: community activity  PERSONAL FACTORS: Age and Time since onset of injury/illness/exacerbation are also affecting patient's functional outcome.   REHAB POTENTIAL: Good  CLINICAL DECISION MAKING: Stable/uncomplicated  EVALUATION COMPLEXITY: Low   GOALS: Goals reviewed with patient? Yes  SHORT TERM GOALS: Target date: 09/13/2023    Pt will be independent with HEP.   Baseline: Goal status: not met  2.  Pt will report improved emptying with BMs in order to reduce FI accidents Baseline:  Goal status: not met  3.  Pt will be I with abdominal massage Baseline:  Goal status: progressing  LONG TERM GOALS: Target date: 10/11/2023    Pt will be independent  with advanced HEP.   Baseline:  Goal status: not met  2.  Pt will report improved stool consistency- bristol stool scale 3-4 d/t improved more regular eating habits to reduce fecal incontinence accidents Baseline:  Goal status: not met  3.  Pt will demonstrate good pelvic floor strength at least 4/5 to reduce urgency and fecal accidents Baseline:  Goal status: not met    PLAN:  PT  FREQUENCY: 1-2x/week  PT DURATION: 8 weeks  PLANNED INTERVENTIONS: 97110-Therapeutic exercises, 97530- Therapeutic activity, 97112- Neuromuscular re-education, 97535- Self Care, 16109- Manual therapy, Dry Needling, Joint mobilization, Joint manipulation, Spinal mobilization, Scar mobilization, and Biofeedback  PLAN FOR NEXT SESSION: pt wants to be D/c'd from PT for now, might come later in the winter  Chaniece Barbato, PT 09/03/23 12:14 PM

## 2023-09-06 ENCOUNTER — Other Ambulatory Visit (HOSPITAL_BASED_OUTPATIENT_CLINIC_OR_DEPARTMENT_OTHER): Payer: Self-pay

## 2023-09-06 ENCOUNTER — Other Ambulatory Visit: Payer: Self-pay

## 2023-09-07 DIAGNOSIS — M0579 Rheumatoid arthritis with rheumatoid factor of multiple sites without organ or systems involvement: Secondary | ICD-10-CM | POA: Diagnosis not present

## 2023-09-07 DIAGNOSIS — Z79899 Other long term (current) drug therapy: Secondary | ICD-10-CM | POA: Diagnosis not present

## 2023-09-08 ENCOUNTER — Other Ambulatory Visit (HOSPITAL_BASED_OUTPATIENT_CLINIC_OR_DEPARTMENT_OTHER): Payer: Self-pay

## 2023-09-08 ENCOUNTER — Other Ambulatory Visit: Payer: Self-pay | Admitting: Internal Medicine

## 2023-09-08 DIAGNOSIS — F411 Generalized anxiety disorder: Secondary | ICD-10-CM | POA: Diagnosis not present

## 2023-09-08 MED ORDER — LEVOTHYROXINE SODIUM 75 MCG PO TABS
75.0000 ug | ORAL_TABLET | Freq: Every day | ORAL | 1 refills | Status: DC
  Filled 2023-09-08: qty 90, 90d supply, fill #0
  Filled 2023-12-06: qty 90, 90d supply, fill #1

## 2023-09-10 ENCOUNTER — Other Ambulatory Visit (HOSPITAL_COMMUNITY): Payer: Self-pay

## 2023-09-10 ENCOUNTER — Encounter: Payer: Medicare PPO | Admitting: Physical Therapy

## 2023-09-15 ENCOUNTER — Other Ambulatory Visit: Payer: Self-pay

## 2023-09-15 ENCOUNTER — Other Ambulatory Visit (HOSPITAL_BASED_OUTPATIENT_CLINIC_OR_DEPARTMENT_OTHER): Payer: Self-pay

## 2023-09-15 DIAGNOSIS — F411 Generalized anxiety disorder: Secondary | ICD-10-CM | POA: Diagnosis not present

## 2023-09-15 MED ORDER — ALPRAZOLAM 0.5 MG PO TABS
0.5000 mg | ORAL_TABLET | Freq: Every morning | ORAL | 0 refills | Status: DC
Start: 2023-09-15 — End: 2024-08-14
  Filled 2023-09-15: qty 30, 30d supply, fill #0

## 2023-09-15 MED ORDER — SERTRALINE HCL 100 MG PO TABS
200.0000 mg | ORAL_TABLET | Freq: Every morning | ORAL | 1 refills | Status: DC
  Filled 2023-10-11: qty 180, 90d supply, fill #0
  Filled 2024-01-04: qty 180, 90d supply, fill #1

## 2023-09-17 ENCOUNTER — Ambulatory Visit: Payer: Medicare PPO | Admitting: Gastroenterology

## 2023-09-20 ENCOUNTER — Ambulatory Visit: Payer: Self-pay | Admitting: Internal Medicine

## 2023-09-20 ENCOUNTER — Ambulatory Visit: Payer: Medicare PPO | Admitting: Internal Medicine

## 2023-09-20 NOTE — Telephone Encounter (Signed)
  Chief Complaint: chest congestion, back pain Symptoms: chest congestion, chronic cough Frequency: constant 3-4 days Pertinent Negatives: Patient denies SOB, nausea, vomiting, dizzines Disposition: [] ED /[] Urgent Care (no appt availability in office) / [x] Appointment(In office/virtual)/ []  [facility]/ [] Home Care/ [] Refused Recommended Disposition /[] [facility]/ []  Follow-up with PCP Additional Notes: Pt called stating she has had chest congestion for 3-4 days, with chronic cough. States she is also having some back pain all over. States she has a hx of pleurisy and RSV and is requesting a chest xray be placed. Pt is scheduled with PCP today at 1630, she is requesting visit be completed via telephone. RN unable to reach clinic due to lunch hours, but will contact clinic to verify this is appropriate for the appt and notify pt of result. Pt verbalized understanding of plan, alerting PCP for review.   Copied from CRM [id number]. Topic: Clinical - Red Word Triage >> Sep 20, 2023  1:04 PM Eunice Blase wrote: Red Word that prompted transfer to Nurse Triage: Pt has tightness chest area. Reason for Disposition  Chest pain(s) lasting a few seconds from coughing  Answer Assessment - Initial Assessment Questions 1. LOCATION: "Where does it hurt?"       Upper chest, between collar bone and top of breasts, some pain in back- all over 2. RADIATION: "Does the pain go anywhere else?" (e.g., into neck, jaw, arms, back)     back 3. ONSET: "When did the chest pain begin?" (Minutes, hours or days)      Approx 3-4 days 4. PATTERN: "Does the pain come and go, or has it been constant since it started?"  "Does it get worse with exertion?"      States constant, feels like congestion 5. DURATION: "How long does it last" (e.g., seconds, minutes, hours)     constant 6. SEVERITY: "How bad is the pain?"  (e.g., Scale 1-10; mild, moderate, or severe)    - MILD (1-3): doesn't interfere with normal  activities     - MODERATE (4-7): interferes with normal activities or awakens from sleep    - SEVERE (8-10): excruciating pain, unable to do any normal activities       4/10 7. CARDIAC RISK FACTORS: "Do you have any history of heart problems or risk factors for heart disease?" (e.g., angina, prior heart attack; diabetes, high blood pressure, high cholesterol, smoker, or strong family history of heart disease)     Cardiac cath 6-7 years ago 8. PULMONARY RISK FACTORS: "Do you have any history of lung disease?"  (e.g., blood clots in lung, asthma, emphysema, birth control pills)     RA 9. CAUSE: "What do you think is causing the chest pain?"     pneumonia 10. OTHER SYMPTOMS: "Do you have any other symptoms?" (e.g., dizziness, nausea, vomiting, sweating, fever, difficulty breathing, cough)       Cough, states it is chronic  Protocols used: Chest Pain-A-AH

## 2023-09-20 NOTE — Telephone Encounter (Signed)
Talk with patient she is unable to come in this afternoon, she will call back if she can come in tomorrow or if she gets worse she will go to urgent care or emergency room.

## 2023-09-20 NOTE — Telephone Encounter (Signed)
Schedule appointment?

## 2023-09-20 NOTE — Telephone Encounter (Signed)
LVM to CB, However Dr Lenord Fellers did say this needs to be an office visit, she needs to listen to patients chest for the chronic cough causing chest pain and back pain.

## 2023-09-21 ENCOUNTER — Ambulatory Visit (HOSPITAL_BASED_OUTPATIENT_CLINIC_OR_DEPARTMENT_OTHER)
Admission: RE | Admit: 2023-09-21 | Discharge: 2023-09-21 | Disposition: A | Discharge status: Home / Self Care | Payer: Medicare PPO | Source: Ambulatory Visit | Attending: Internal Medicine | Admitting: Internal Medicine

## 2023-09-21 ENCOUNTER — Encounter: Payer: Self-pay | Admitting: Internal Medicine

## 2023-09-21 ENCOUNTER — Ambulatory Visit: Payer: Medicare PPO | Admitting: Internal Medicine

## 2023-09-21 ENCOUNTER — Other Ambulatory Visit (HOSPITAL_BASED_OUTPATIENT_CLINIC_OR_DEPARTMENT_OTHER): Payer: Self-pay

## 2023-09-21 VITALS — BP 110/80 | HR 65 | Temp 98.6°F | Ht 63.5 in | Wt 156.0 lb

## 2023-09-21 DIAGNOSIS — J22 Unspecified acute lower respiratory infection: Secondary | ICD-10-CM | POA: Diagnosis not present

## 2023-09-21 DIAGNOSIS — I700 Atherosclerosis of aorta: Secondary | ICD-10-CM | POA: Diagnosis not present

## 2023-09-21 DIAGNOSIS — M05742 Rheumatoid arthritis with rheumatoid factor of left hand without organ or systems involvement: Secondary | ICD-10-CM

## 2023-09-21 DIAGNOSIS — M05741 Rheumatoid arthritis with rheumatoid factor of right hand without organ or systems involvement: Secondary | ICD-10-CM | POA: Diagnosis not present

## 2023-09-21 MED ORDER — DOXYCYCLINE HYCLATE 100 MG PO TABS
100.0000 mg | ORAL_TABLET | Freq: Two times a day (BID) | ORAL | 0 refills | Status: DC
  Filled 2023-09-21: qty 20, 10d supply, fill #0

## 2023-09-21 NOTE — Progress Notes (Signed)
Patient Care Team: Margaree Mackintosh, MD as PCP - General (Internal Medicine) Linna Darner, RD as Dietitian (Family Medicine)  Visit Date: 09/21/23  Subjective:    Patient ID: Pamela Ingram , Female   DOB: 06/09/1951, 72 y.o.    MRN: [id number]   72 y.o. Female presents today for cough with clear sputum, wheezing, chest tightness recently. Denies fever, chills, sore throat, shortness of breath.  Past Medical History:  Diagnosis Date   Anxiety    Hyperlipidemia    Melanoma in situ of left upper extremity (HCC) 04/2023   Pleurisy with effusion    Rheumatoid arthritis(714.0)    Thyroid disease    hypothyroidism     Family History  Problem Relation Age of Onset   Emphysema Mother    Rheum arthritis Mother    Hypertension Father    Stroke Father    Diabetes Paternal Uncle    Colon cancer Neg Hx    Rectal cancer Neg Hx    Stomach cancer Neg Hx    Esophageal cancer Neg Hx    Cancer Neg Hx     Social History   Social History Narrative      Social:Does not smoke. Former smoker.  Social alcohol consumption. EtOH 1-2 drinks/week. 3 caffeine/day. She is married.  2 adult sons.  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  Father died of complications of sepsis.  He had a stroke.  Mother died of respiratory complications and had history of rheumatoid arthritis.  1 sister living.  No brothers.      Review of Systems  Constitutional:  Negative for chills, fever and malaise/fatigue.  HENT:  Negative for congestion and sore throat.   Eyes:  Negative for blurred vision.  Respiratory:  Positive for cough and sputum production. Negative for shortness of breath.   Cardiovascular:  Positive for chest pain. Negative for palpitations and leg swelling.  Gastrointestinal:  Negative for vomiting.  Musculoskeletal:  Negative for back pain.  Skin:  Negative for rash.  Neurological:   Negative for loss of consciousness and headaches.        Objective:   Vitals: BP 110/80   Pulse 65   Temp 98.6 F (37 C)   Ht 5' 3.5" (1.613 m)   Wt 156 lb (70.8 kg)   SpO2 97%   BMI 27.20 kg/m    Physical Exam Vitals and nursing note reviewed.  Constitutional:      General: She is not in acute distress.    Appearance: Normal appearance. She is not toxic-appearing.  HENT:     Head: Normocephalic and atraumatic.  Pulmonary:     Effort: Pulmonary effort is normal. No respiratory distress.     Comments: Inspiratory wheezing. Coarse breath sounds bibasilar. Skin:    General: Skin is warm and dry.  Neurological:     Mental Status: She is alert and oriented to person, place, and time. Mental status is at baseline.  Psychiatric:        Mood and Affect: Mood normal.        Behavior: Behavior normal.        Thought Content: Thought content normal.        Judgment: Judgment normal.       Results:   Studies obtained and personally reviewed by me:   Labs:       Component  Value Date/Time   NA 140 01/19/2023 0944   NA 142 05/31/2017 0907   K 4.3 01/19/2023 0944   CL 104 01/19/2023 0944   CO2 28 01/19/2023 0944   GLUCOSE 86 01/19/2023 0944   BUN 21 01/19/2023 0944   BUN 21 05/31/2017 0907   CREATININE 0.94 01/19/2023 0944   CALCIUM 9.6 01/19/2023 0944   PROT 7.2 08/10/2023 1126   ALBUMIN 4.3 11/01/2021 1253   AST 17 08/10/2023 1126   ALT 9 08/10/2023 1126   ALKPHOS 55 11/01/2021 1253   BILITOT 0.6 08/10/2023 1126   GFRNONAA >60 11/01/2021 1253   GFRNONAA 79 12/05/2020 1122   GFRAA 92 12/05/2020 1122     Lab Results  Component Value Date   WBC 5.9 01/19/2023   HGB 14.0 01/19/2023   HCT 41.8 01/19/2023   MCV 87.1 01/19/2023   PLT 230 01/19/2023    Lab Results  Component Value Date   CHOL 238 (H) 08/10/2023   HDL 70 08/10/2023   LDLCALC 145 (H) 08/10/2023   TRIG 113 08/10/2023   CHOLHDL 3.4 08/10/2023    No results found for: "HGBA1C"   Lab  Results  Component Value Date   TSH 1.68 08/10/2023      Assessment & Plan:   Acute lower respiratory infection: prescribed doxycycline 100 mg twice daily. Ordered CXR and will contact with results. Get plenty of rest and stay well-hydrated.  Addendum: CXR negative and pt advised.  I,Alexander Ruley,acting as a Neurosurgeon for Margaree Mackintosh, MD.,have documented all relevant documentation on the behalf of Margaree Mackintosh, MD,as directed by  Margaree Mackintosh, MD while in the presence of Margaree Mackintosh, MD.   I, Margaree Mackintosh, MD, have reviewed all documentation for this visit. The documentation on 09/25/23 for the exam, diagnosis, procedures, and orders are all accurate and complete.

## 2023-09-25 NOTE — Patient Instructions (Signed)
Please have chest x-ray today.  Take doxycycline 100 mg twice daily for 10 days.  Rest and stay well-hydrated.  Addendum: Chest x-ray is negative.  No evidence of pneumonia.

## 2023-10-01 ENCOUNTER — Encounter: Payer: Medicare PPO | Admitting: Physical Therapy

## 2023-10-06 DIAGNOSIS — F411 Generalized anxiety disorder: Secondary | ICD-10-CM | POA: Diagnosis not present

## 2023-10-07 ENCOUNTER — Encounter: Payer: Medicare PPO | Admitting: Physical Therapy

## 2023-10-08 ENCOUNTER — Ambulatory Visit: Payer: Medicare PPO | Attending: Cardiovascular Disease | Admitting: Cardiovascular Disease

## 2023-10-08 ENCOUNTER — Other Ambulatory Visit (HOSPITAL_BASED_OUTPATIENT_CLINIC_OR_DEPARTMENT_OTHER): Payer: Self-pay

## 2023-10-08 ENCOUNTER — Other Ambulatory Visit: Payer: Self-pay

## 2023-10-08 ENCOUNTER — Encounter: Payer: Self-pay | Admitting: Cardiovascular Disease

## 2023-10-08 VITALS — BP 150/71 | HR 81 | Ht 63.0 in | Wt 154.0 lb

## 2023-10-08 DIAGNOSIS — R030 Elevated blood-pressure reading, without diagnosis of hypertension: Secondary | ICD-10-CM | POA: Diagnosis not present

## 2023-10-08 DIAGNOSIS — E7800 Pure hypercholesterolemia, unspecified: Secondary | ICD-10-CM

## 2023-10-08 DIAGNOSIS — Z789 Other specified health status: Secondary | ICD-10-CM

## 2023-10-08 DIAGNOSIS — I700 Atherosclerosis of aorta: Secondary | ICD-10-CM | POA: Insufficient documentation

## 2023-10-08 DIAGNOSIS — M0580 Other rheumatoid arthritis with rheumatoid factor of unspecified site: Secondary | ICD-10-CM | POA: Diagnosis not present

## 2023-10-08 MED ORDER — ROSUVASTATIN CALCIUM 20 MG PO TABS
20.0000 mg | ORAL_TABLET | Freq: Every day | ORAL | 3 refills | Status: DC
  Filled 2023-10-08: qty 90, 90d supply, fill #0
  Filled 2024-01-03: qty 90, 90d supply, fill #1
  Filled 2024-04-03: qty 90, 90d supply, fill #2
  Filled 2024-06-30: qty 90, 90d supply, fill #3

## 2023-10-08 NOTE — Progress Notes (Signed)
Cardiology Office Note:    Date:  10/08/2023   ID:  AZARIE MARGOLIS, DOB 1951-01-22, MRN [id number]  PCP:  Margaree Mackintosh, MD   [facility] Providers Cardiologist:  None     Referring MD: Margaree Mackintosh, MD   No chief complaint on file. Pamela Ingram is a 72 y.o. female who is being seen today for the evaluation of hypercholesterolemia at the request of Baxley, [name], MD.   History of Present Illness:    Pamela Ingram is a 72 y.o. 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   She is currently on [place] and Actemra for her rheumatoid arthritis and psoriasis, respectively. The patient reports that her joint symptoms are well-controlled, with only minor ulnar deviation noted. She denies any pain or swelling in the joints.  In 2018 she underwent cardiac catheterization for what ended up being a false positive exercise treadmill stress test.  She did not have any visible coronary plaque.  In November 2023 she had a coronary calcium score of 0 and no evidence of aortic atherosclerosis in the visualized portions of the aorta..  A chest x-ray performed on 2 weeks ago did show some evidence of aortic atherosclerosis.  The patient has a family history of cardiovascular disease, with her mother having bradycardia and a pacemaker, and her father having a stroke in his 78. She also has two paternal aunts who underwent endarterectomy in their sixties. The patient denies any personal history of diabetes. Quit smoking >15 years ago.  The patient has been on simvastatin for her elevated cholesterol levels. She reports taking the medication in the evening, as instructed. Despite being on simvastatin, her cholesterol levels remain above the desired range. The patient expresses reluctance to increase the dose of simvastatin due to potential side effects and drug  interactions.  In addition to her rheumatoid arthritis and psoriasis, the patient also reports experiencing stress, which she believes may be contributing to her elevated blood pressure readings. She denies taking any additional NSAIDs such as ibuprofen regularly. The patient also reports that she consumes alcohol, limited to one drink per day.  Past Medical History:  Diagnosis Date   Anxiety    Hyperlipidemia    Melanoma in situ of left upper extremity (HCC) 04/2023   Pleurisy with effusion    Rheumatoid arthritis(714.0)    Thyroid disease    hypothyroidism    Past Surgical History:  Procedure Laterality Date   COLONOSCOPY     LAPAROSCOPY     LEFT HEART CATH AND CORONARY ANGIOGRAPHY N/A 06/04/2017   Procedure: Left Heart Cath and Coronary Angiography;  Surgeon: Tonny Bollman, MD;  Location: [facility] INVASIVE CV LAB;  Service: Cardiovascular;  Laterality: N/A;   MELANOMA EXCISION  04/2023   TUBAL LIGATION     VAGINAL HYSTERECTOMY  10/26/1993    Current Medications: Current Meds  Medication Sig   ALPRAZolam (XANAX) 0.5 MG tablet Take 1 tablet (0.5 mg total) by mouth every morning as needed   Apremilast (OTEZLA) 30 MG TABS Take 1 tablet (30 mg total) by mouth 2 (two) times daily.   hydroquinone 4 % cream Apply to affected dark areas twice daily. Stop if you notice darkening. Use sunscreen.   ibuprofen (ADVIL,MOTRIN) 200 MG tablet Take 800 mg by mouth every 8 (eight) hours as needed for moderate pain.   levothyroxine (SYNTHROID) 75  MCG tablet Take 1 tablet (75 mcg total) by mouth daily.   rosuvastatin (CRESTOR) 20 MG tablet Take 1 tablet (20 mg total) by mouth daily.   sertraline (ZOLOFT) 100 MG tablet Take 2 tablets (200 mg total) by mouth in the morning.   Tocilizumab (ACTEMRA) 162 MG/0.9ML SOSY Inject 0.9 ml (1 syringe) subcutaneously every week   tretinoin (RETIN-A) 0.05 % cream Apply a pea sized amount to entire face and neck daily at bedtime. Avoid use during the day. Can mix with  3 pea sized amounts of moisturizer to reduce irritation. Can start using a few times a week and increase to nightly use as tolerated. Use sunscreen daily and reapply if out in the sun for over 2 hours.   [DISCONTINUED] simvastatin (ZOCOR) 20 MG tablet Take 1 tablet (20 mg total) by mouth every evening.     Allergies:   Amoxicillin   Social History   Socioeconomic History   Marital status: Married    Spouse name: Not on file   Number of children: 2   Years of education: Not on file   Highest education level: Associate degree: occupational, Scientist, product/process development, or vocational program  Occupational History   Occupation: NURSE    Employer: [employer]  Tobacco Use   Smoking status: Former    Current packs/day: 0.00    Average packs/day: 0.5 packs/day for 44.0 years (22.0 ttl pk-yrs)    Types: Cigarettes    Start date: 10/26/1965    Quit date: 10/26/2009    Years since quitting: 13.9   Smokeless tobacco: Never   Tobacco comments:    off and on x 44 years 03/14/13  Vaping Use   Vaping status: Never Used  Substance and Sexual Activity   Alcohol use: Yes    Alcohol/week: 0.0 standard drinks of alcohol    Comment: 1-2 drinks a week   Drug use: No   Sexual activity: Yes    Birth control/protection: Surgical  Other Topics Concern   Not on file  Social History Narrative      Social:Does not smoke. Former smoker.  Social alcohol consumption. EtOH 1-2 drinks/week. 3 caffeine/day. She is married.  2 adult sons.  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  Father died of complications of sepsis.  He had a stroke.  Mother died of respiratory complications and had history of rheumatoid arthritis.  1 sister living.  No brothers.   Social Drivers of Corporate investment banker Strain: Low Risk  (08/09/2023)   Overall Financial Resource Strain (CARDIA)    Difficulty of Paying Living Expenses: Not hard at  all  Food Insecurity: No Food Insecurity (08/09/2023)   Hunger Vital Sign    Worried About Running Out of Food in the Last Year: Never true    Ran Out of Food in the Last Year: Never true  Transportation Needs: No Transportation Needs (08/09/2023)   PRAPARE - Administrator, Civil Service (Medical): No    Lack of Transportation (Non-Medical): No  Physical Activity: Sufficiently Active (08/09/2023)   Exercise Vital Sign    Days of Exercise per Week: 4 days    Minutes of Exercise per Session: 40 min  Stress: Stress Concern Present (08/09/2023)   [employer] of Occupational Health - Occupational Stress Questionnaire    Feeling of Stress : To some extent  Social Connections: Socially Integrated (08/09/2023)  Social Advertising account executive [NHANES]    Frequency of Communication with Friends and Family: More than three times a week    Frequency of Social Gatherings with Friends and Family: More than three times a week    Attends Religious Services: More than 4 times per year    Active Member of [employer] or Organizations: Yes    Attends Engineer, structural: More than 4 times per year    Marital Status: Married     Family History: The patient's family history includes Diabetes in her paternal uncle; Emphysema in her mother; Hypertension in her father; Rheum arthritis in her mother; Stroke in her father. There is no history of Colon cancer, Rectal cancer, Stomach cancer, Esophageal cancer, or Cancer.  ROS:   Please see the history of present illness.     All other systems reviewed and are negative.  EKGs/Labs/Other Studies Reviewed:    The following studies were reviewed today: Chest Xray 09/21/2023 The heart size and mediastinal contours are within normal limits. Aortic atherosclerosis. No focal airspace consolidation, pleural effusion, or pneumothorax. The visualized skeletal structures are unremarkable.  Coronary calcium score 06/30/2022 Coronary  calcium score of 0. This was 0 percentile for age-, race-, and sex-matched controls.    EKG Interpretation Date/Time:  Friday October 08 2023 09:34:45 EST Ventricular Rate:  81 PR Interval:  152 QRS Duration:  64 QT Interval:  368 QTC Calculation: 427 R Axis:   59  Text Interpretation: Normal sinus rhythm Normal ECG When compared with ECG of 01-Nov-2021 12:55, PREVIOUS ECG IS PRESENT Confirmed by Aasir Daigler [phone]) on 10/08/2023 9:38:31 AM    Recent Labs: 01/19/2023: BUN 21; Creat 0.94; Hemoglobin 14.0; Platelets 230; Potassium 4.3; Sodium 140 08/10/2023: ALT 9; TSH 1.68  Recent Lipid Panel    Component Value Date/Time   CHOL 238 (H) 08/10/2023 1126   TRIG 113 08/10/2023 1126   HDL 70 08/10/2023 1126   CHOLHDL 3.4 08/10/2023 1126   LDLCALC 145 (H) 08/10/2023 1126     Risk Assessment/Calculations:      HYPERTENSION CONTROL Vitals:   10/08/23 0928 10/08/23 1004  BP: (!) 152/76 (!) 150/71    The patient's blood pressure is elevated above target today.  In order to address the patient's elevated BP: Blood pressure will be monitored at home to determine if medication changes need to be made.            Physical Exam:    VS:  BP (!) 150/71   Pulse 81   Ht 5\' 3"  (1.6 m)   Wt 154 lb (69.9 kg)   SpO2 99%   BMI 27.28 kg/m     Wt Readings from Last 3 Encounters:  10/08/23 154 lb (69.9 kg)  09/21/23 156 lb (70.8 kg)  07/14/23 156 lb (70.8 kg)     GEN: Lean and fit, Well nourished, well developed in no acute distress HEENT: Normal NECK: No JVD; No carotid bruits LYMPHATICS: No lymphadenopathy CARDIAC: RRR, no murmurs, rubs, gallops RESPIRATORY:  Clear to auscultation without rales, wheezing or rhonchi  ABDOMEN: Soft, non-tender, non-distended MUSCULOSKELETAL:  No edema; No deformity  SKIN: Warm and dry NEUROLOGIC:  Alert and oriented x 3 PSYCHIATRIC:  Normal affect   ASSESSMENT:    1. Atherosclerosis of aorta (HCC)   2. Hypercholesterolemia   3.  Elevated blood pressure reading   4. False positive cardiac stress test   5. Seropositive erosive rheumatoid arthritis (HCC)    PLAN:    In  order of problems listed above:  Aortic atherosclerosis: Does mark some development of atherosclerotic plaque and it is important to keep her LDL cholesterol lower, at least at a standard of LDL under 130.  However, based on her excellent coronary calcium score at the age of 29 she has a 10-year risk of cardiovascular events of only 2.2%, based on the [facility] database. HLP: Has elevated LDL cholesterol, but also an excellent HDL and does not have any other coronary risk factors (except for quiescent inflammatory arthritis).  Suspect she may have heterozygous familial hypercholesterolemia inherited from her paternal side.  Would like to switch from simvastatin to rosuvastatin 20 mg once daily and recheck lipid profile in 3 years.  Again, based on her excellent coronary calcium score I think a target LDL under 130 is a reasonable goal.  She has labs scheduled with Dr. Lenord Fellers on March 31. Elevated BP: Seems to be an isolated event.  Just 2 weeks ago her blood pressure was 110/80 in the doctor's office.  Asked her to just monitor it. History of false positive exercise ECG test: If future coronary evaluation is indicated, a simple treadmill stress test would not be a good choice.           Medication Adjustments/Labs and Tests Ordered: Current medicines are reviewed at length with the patient today.  Concerns regarding medicines are outlined above.  Orders Placed This Encounter  Procedures   Lipid panel   EKG 12-Lead   Meds ordered this encounter  Medications   rosuvastatin (CRESTOR) 20 MG tablet    Sig: Take 1 tablet (20 mg total) by mouth daily.    Dispense:  90 tablet    Refill:  3    Patient Instructions  Medication Instructions:  STOP Simvastatin START Rosuvastatin 20 mg  *If you need a refill on your cardiac medications before your next  appointment, please call your pharmacy*   Lab Work: Lipid panel- 01/24/24- fasting  If you have labs (blood work) drawn today and your tests are completely normal, you will receive your results only by: MyChart Message (if you have MyChart) OR A paper copy in the mail If you have any lab test that is abnormal or we need to change your treatment, we will call you to review the results.  Follow-Up: At [facility], you and your health needs are our priority.  As part of our continuing mission to provide you with exceptional heart care, we have created designated Provider Care Teams.  These Care Teams include your primary Cardiologist (physician) and Advanced Practice Providers (APPs -  Physician Assistants and Nurse Practitioners) who all work together to provide you with the care you need, when you need it.  We recommend signing up for the patient portal called "MyChart".  Sign up information is provided on this After Visit Summary.  MyChart is used to connect with patients for Virtual Visits (Telemedicine).  Patients are able to view lab/test results, encounter notes, upcoming appointments, etc.  Non-urgent messages can be sent to your provider as well.   To learn more about what you can do with MyChart, go to [email or web address].    Your next appointment:    Follow up as needed  Provider:   Dr Royann Shivers    Signed, Thurmon Fair, MD  10/08/2023 10:04 AM    [facility]

## 2023-10-08 NOTE — Patient Instructions (Signed)
Medication Instructions:  STOP Simvastatin START Rosuvastatin 20 mg  *If you need a refill on your cardiac medications before your next appointment, please call your pharmacy*   Lab Work: Lipid panel- 01/24/24- fasting  If you have labs (blood work) drawn today and your tests are completely normal, you will receive your results only by: MyChart Message (if you have MyChart) OR A paper copy in the mail If you have any lab test that is abnormal or we need to change your treatment, we will call you to review the results.  Follow-Up: At [facility], you and your health needs are our priority.  As part of our continuing mission to provide you with exceptional heart care, we have created designated Provider Care Teams.  These Care Teams include your primary Cardiologist (physician) and Advanced Practice Providers (APPs -  Physician Assistants and Nurse Practitioners) who all work together to provide you with the care you need, when you need it.  We recommend signing up for the patient portal called "MyChart".  Sign up information is provided on this After Visit Summary.  MyChart is used to connect with patients for Virtual Visits (Telemedicine).  Patients are able to view lab/test results, encounter notes, upcoming appointments, etc.  Non-urgent messages can be sent to your provider as well.   To learn more about what you can do with MyChart, go to [email or web address].    Your next appointment:    Follow up as needed  Provider:   Dr Royann Shivers

## 2023-10-11 ENCOUNTER — Encounter: Payer: Medicare PPO | Admitting: Physical Therapy

## 2023-10-11 ENCOUNTER — Other Ambulatory Visit (HOSPITAL_BASED_OUTPATIENT_CLINIC_OR_DEPARTMENT_OTHER): Payer: Self-pay

## 2023-10-12 ENCOUNTER — Other Ambulatory Visit: Payer: Self-pay

## 2023-10-28 ENCOUNTER — Other Ambulatory Visit: Payer: Self-pay

## 2023-10-28 ENCOUNTER — Encounter (HOSPITAL_BASED_OUTPATIENT_CLINIC_OR_DEPARTMENT_OTHER): Payer: Self-pay | Admitting: Urology

## 2023-10-28 ENCOUNTER — Telehealth: Payer: Medicare PPO | Admitting: Physician Assistant

## 2023-10-28 ENCOUNTER — Emergency Department (HOSPITAL_BASED_OUTPATIENT_CLINIC_OR_DEPARTMENT_OTHER)
Admission: EM | Admit: 2023-10-28 | Discharge: 2023-10-29 | Disposition: A | Discharge status: Home / Self Care | Payer: Medicare PPO | Attending: Emergency Medicine | Admitting: Emergency Medicine

## 2023-10-28 ENCOUNTER — Emergency Department (HOSPITAL_BASED_OUTPATIENT_CLINIC_OR_DEPARTMENT_OTHER): Payer: Medicare PPO

## 2023-10-28 DIAGNOSIS — R1032 Left lower quadrant pain: Secondary | ICD-10-CM | POA: Diagnosis not present

## 2023-10-28 DIAGNOSIS — Z90710 Acquired absence of both cervix and uterus: Secondary | ICD-10-CM | POA: Diagnosis not present

## 2023-10-28 DIAGNOSIS — R1030 Lower abdominal pain, unspecified: Secondary | ICD-10-CM | POA: Diagnosis not present

## 2023-10-28 DIAGNOSIS — R140 Abdominal distension (gaseous): Secondary | ICD-10-CM

## 2023-10-28 DIAGNOSIS — N3289 Other specified disorders of bladder: Secondary | ICD-10-CM | POA: Diagnosis not present

## 2023-10-28 DIAGNOSIS — R198 Other specified symptoms and signs involving the digestive system and abdomen: Secondary | ICD-10-CM

## 2023-10-28 DIAGNOSIS — K5792 Diverticulitis of intestine, part unspecified, without perforation or abscess without bleeding: Secondary | ICD-10-CM

## 2023-10-28 LAB — COMPREHENSIVE METABOLIC PANEL
ALT: 17 U/L (ref 0–44)
AST: 23 U/L (ref 15–41)
Albumin: 4.2 g/dL (ref 3.5–5.0)
Alkaline Phosphatase: 56 U/L (ref 38–126)
Anion gap: 8 (ref 5–15)
BUN: 14 mg/dL (ref 8–23)
CO2: 26 mmol/L (ref 22–32)
Calcium: 9.1 mg/dL (ref 8.9–10.3)
Chloride: 103 mmol/L (ref 98–111)
Creatinine, Ser: 0.86 mg/dL (ref 0.44–1.00)
GFR, Estimated: >60 mL/min (ref >60)
Glucose, Bld: 100 mg/dL — ABNORMAL HIGH (ref 70–99)
Potassium: 3.4 mmol/L — ABNORMAL LOW (ref 3.5–5.1)
Sodium: 137 mmol/L (ref 135–145)
Total Bilirubin: 0.7 mg/dL (ref 0.0–1.2)
Total Protein: 7.3 g/dL (ref 6.5–8.1)

## 2023-10-28 LAB — LIPASE, BLOOD: Lipase: 25 U/L (ref 11–51)

## 2023-10-28 LAB — CBC WITH DIFFERENTIAL/PLATELET
Abs Immature Granulocytes: 0.04 10*3/uL (ref 0.00–0.07)
Basophils Absolute: 0 10*3/uL (ref 0.0–0.1)
Basophils Relative: 0 %
Eosinophils Absolute: 0.2 10*3/uL (ref 0.0–0.5)
Eosinophils Relative: 1 %
HCT: 38.4 % (ref 36.0–46.0)
Hemoglobin: 13 g/dL (ref 12.0–15.0)
Immature Granulocytes: 0 %
Lymphocytes Relative: 13 %
Lymphs Abs: 1.8 10*3/uL (ref 0.7–4.0)
MCH: 30.1 pg (ref 26.0–34.0)
MCHC: 33.9 g/dL (ref 30.0–36.0)
MCV: 88.9 fL (ref 80.0–100.0)
Monocytes Absolute: 1.1 10*3/uL — ABNORMAL HIGH (ref 0.1–1.0)
Monocytes Relative: 8 %
Neutro Abs: 10.9 10*3/uL — ABNORMAL HIGH (ref 1.7–7.7)
Neutrophils Relative %: 78 %
Platelets: 177 10*3/uL (ref 150–400)
RBC: 4.32 MIL/uL (ref 3.87–5.11)
RDW: 12.5 % (ref 11.5–15.5)
WBC: 14.1 10*3/uL — ABNORMAL HIGH (ref 4.0–10.5)
nRBC: 0 % (ref 0.0–0.2)

## 2023-10-28 MED ORDER — IOHEXOL 300 MG/ML  SOLN
100.0000 mL | Freq: Once | INTRAMUSCULAR | Status: AC | PRN
  Administered 2023-10-28: 100 mL via INTRAVENOUS

## 2023-10-28 NOTE — Progress Notes (Signed)
 Virtual Visit Consent   Pamela Ingram, you are scheduled for a virtual visit with a [facility] provider today. Just as with appointments in the office, your consent must be obtained to participate. Your consent will be active for this visit and any virtual visit you may have with one of our providers in the next 365 days. If you have a MyChart account, a copy of this consent can be sent to you electronically.  As this is a virtual visit, video technology does not allow for your provider to perform a traditional examination. This may limit your provider's ability to fully assess your condition. If your provider identifies any concerns that need to be evaluated in person or the need to arrange testing (such as labs, EKG, etc.), we will make arrangements to do so. Although advances in technology are sophisticated, we cannot ensure that it will always work on either your end or our end. If the connection with a video visit is poor, the visit may have to be switched to a telephone visit. With either a video or telephone visit, we are not always able to ensure that we have a secure connection.  By engaging in this virtual visit, you consent to the provision of healthcare and authorize for your insurance to be billed (if applicable) for the services provided during this visit. Depending on your insurance coverage, you may receive a charge related to this service.  I need to obtain your verbal consent now. Are you willing to proceed with your visit today? Pamela Ingram has provided verbal consent on 10/28/2023 for a virtual visit (video or telephone). Delon CHRISTELLA Dickinson, PA-C  Date: 10/28/2023 7:03 PM  Virtual Visit via Video Note   I, Delon CHRISTELLA Dickinson, connected with  Pamela Ingram  ([id number], 16-Aug-1951) on 10/28/23 at  7:30 PM EST by a video-enabled telemedicine application and verified that I am speaking with the correct person using two identifiers.  Location: Patient: Virtual Visit Location Patient:  Home Provider: Virtual Visit Location Provider: Home Office   I discussed the limitations of evaluation and management by telemedicine and the availability of in person appointments. The patient expressed understanding and agreed to proceed.    History of Present Illness: Pamela Ingram is a 73 y.o. who identifies as a female who was assigned female at birth, and is being seen today for LLQ abdominal pain.  HPI: GI Problem The primary symptoms include fatigue, abdominal pain and diarrhea (soft, formed stool once today; otherwise have been frequent small amounts and some harder stools). Primary symptoms do not include fever, weight loss, nausea, vomiting, melena, hematemesis or hematochezia. The illness began 2 days ago. The onset was sudden. The problem has been gradually worsening.  The abdominal pain began 2 days ago. The abdominal pain has been gradually worsening since its onset. The abdominal pain is located in the LLQ. The abdominal pain does not radiate.  The illness is also significant for bloating (reports abdomen is distended), constipation and back pain.    Reports having Diverticula noted on colonoscopy in 2023. Has never had Diverticulitis. Has had a hysterectomy, only abdominal surgery.  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   Angioedema of lips 10/07/2020   Rash 10/07/2020   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   Anxiety 05/29/2016   Esophageal reflux 05/29/2016  Osteopenia 05/29/2016   Lichen sclerosus et atrophicus 07/24/2015   Overweight (BMI 25.0-29.9) 03/13/2013   Rheumatoid arthritis (HCC) 08/03/2011   Rheumatoid arthritis (HCC) 10/26/2008   Hypercholesterolemia 10/27/2007   Hypothyroid 10/27/2003   Depression with anxiety 10/26/1993    Allergies:  Allergies  Allergen Reactions   Amoxicillin   Other (See Comments)    Pt gets yeast infection Has patient had a PCN reaction causing immediate rash, facial/tongue/throat swelling, SOB or lightheadedness with hypotension: No Has patient had a PCN reaction causing severe rash involving mucus membranes or skin necrosis: No Has patient had a PCN reaction that required hospitalization: No Has patient had a PCN reaction occurring within the last 10 years: No If all of the above answers are NO, then may proceed with Cephalosporin use.    Medications:  Current Outpatient Medications:    ALPRAZolam  (XANAX ) 0.5 MG tablet, Take 1 tablet (0.5 mg total) by mouth every morning as needed, Disp: 90 tablet, Rfl: 0   Apremilast  (OTEZLA ) 30 MG TABS, Take 1 tablet (30 mg total) by mouth 2 (two) times daily., Disp: 60 tablet, Rfl: 11   hydroquinone  4 % cream, Apply to affected dark areas twice daily. Stop if you notice darkening. Use sunscreen., Disp: 28.35 g, Rfl: 6   ibuprofen  (ADVIL ,MOTRIN ) 200 MG tablet, Take 800 mg by mouth every 8 (eight) hours as needed for moderate pain., Disp: , Rfl:    levothyroxine  (SYNTHROID ) 75 MCG tablet, Take 1 tablet (75 mcg total) by mouth daily., Disp: 90 tablet, Rfl: 1   rosuvastatin  (CRESTOR ) 20 MG tablet, Take 1 tablet (20 mg total) by mouth daily., Disp: 90 tablet, Rfl: 3   sertraline  (ZOLOFT ) 100 MG tablet, Take 2 tablets (200 mg total) by mouth in the morning., Disp: 180 tablet, Rfl: 1   Tocilizumab  (ACTEMRA ) 162 MG/0.9ML SOSY, Inject 0.9 ml (1 syringe) subcutaneously every week, Disp: 12 Syringe, Rfl: 3   tretinoin  (RETIN-A ) 0.05 % cream, Apply a pea sized amount to entire face and neck daily at bedtime. Avoid use during the day. Can mix with 3 pea sized amounts of moisturizer to reduce irritation. Can start using a few times a week and increase to nightly use as tolerated. Use sunscreen daily and reapply if out in the sun for over 2 hours., Disp: 45 g, Rfl: 6  Observations/Objective: Patient is well-developed,  well-nourished in no acute distress.  Resting comfortably at home.  Head is normocephalic, atraumatic.  No labored breathing.  Speech is clear and coherent with logical content.  Patient is alert and oriented at baseline.    Assessment and Plan: 1. LLQ pain (Primary)  2. Abdominal distension  3. Change in bowel movement  - LLQ pain with distension and bowel changes - DDx: Diverticulitis, partial bowel obstruction - Advised in person evaluation at closest ER facility for consideration of a CT scan   Follow Up Instructions: I discussed the assessment and treatment plan with the patient. The patient was provided an opportunity to ask questions and all were answered. The patient agreed with the plan and demonstrated an understanding of the instructions.  A copy of instructions were sent to the patient via MyChart unless otherwise noted below.    The patient was advised to call back or seek an in-person evaluation if the symptoms worsen or if the condition fails to improve as anticipated.    Delon CHRISTELLA Dickinson, PA-C

## 2023-10-28 NOTE — Patient Instructions (Signed)
  Pamela Ingram, thank you for joining Pamela CHRISTELLA Dickinson, PA-C for today's virtual visit.  While this provider is not your primary care provider (PCP), if your PCP is located in our provider database this encounter information will be shared with them immediately following your visit.   A Pamela Ingram MyChart account gives you access to today's visit and all your visits, tests, and labs performed at [facility]  click here if you don't have a [facility] MyChart account or go to [email or web address]  Consent: (Patient) Pamela Ingram provided verbal consent for this virtual visit at the beginning of the encounter.  Current Medications:  Current Outpatient Medications:    ALPRAZolam  (XANAX ) 0.5 MG tablet, Take 1 tablet (0.5 mg total) by mouth every morning as needed, Disp: 90 tablet, Rfl: 0   Apremilast  (OTEZLA ) 30 MG TABS, Take 1 tablet (30 mg total) by mouth 2 (two) times daily., Disp: 60 tablet, Rfl: 11   hydroquinone  4 % cream, Apply to affected dark areas twice daily. Stop if you notice darkening. Use sunscreen., Disp: 28.35 g, Rfl: 6   ibuprofen  (ADVIL ,MOTRIN ) 200 MG tablet, Take 800 mg by mouth every 8 (eight) hours as needed for moderate pain., Disp: , Rfl:    levothyroxine  (SYNTHROID ) 75 MCG tablet, Take 1 tablet (75 mcg total) by mouth daily., Disp: 90 tablet, Rfl: 1   rosuvastatin  (CRESTOR ) 20 MG tablet, Take 1 tablet (20 mg total) by mouth daily., Disp: 90 tablet, Rfl: 3   sertraline  (ZOLOFT ) 100 MG tablet, Take 2 tablets (200 mg total) by mouth in the morning., Disp: 180 tablet, Rfl: 1   Tocilizumab  (ACTEMRA ) 162 MG/0.9ML SOSY, Inject 0.9 ml (1 syringe) subcutaneously every week, Disp: 12 Syringe, Rfl: 3   tretinoin  (RETIN-A ) 0.05 % cream, Apply a pea sized amount to entire face and neck daily at bedtime. Avoid use during the day. Can mix with 3 pea sized amounts of moisturizer to reduce irritation. Can start using a few times a week and increase to nightly use as  tolerated. Use sunscreen daily and reapply if out in the sun for over 2 hours., Disp: 45 g, Rfl: 6   Medications ordered in this encounter:  No orders of the defined types were placed in this encounter.    *If you need refills on other medications prior to your next appointment, please contact your pharmacy*  Follow-Up: Call back or seek an in-person evaluation if the symptoms worsen or if the condition fails to improve as anticipated.  [facility] [phone]  Other Instructions Proceed to closest ER for further evaluation   If you have been instructed to have an in-person evaluation today at a local Urgent Care facility, please use the link below. It will take you to a list of all of our available [facility], including address, phone number and hours of operation. Please do not delay care.  [facility]  If you or a family member do not have a primary care provider, use the link below to schedule a visit and establish care. When you choose a [facility] primary care physician or advanced practice provider, you gain a long-term partner in health. Find a Primary Care Provider  Learn more about [facility]'s in-office and virtual care options: [facility] - Get Care Now

## 2023-10-28 NOTE — ED Triage Notes (Signed)
 Pt states lower abdominal pain and distention x 2 days  H/o diverticula  Denies fever, denies vomiting  Had BM today but they are not normal  States feels like trapped gas

## 2023-10-29 ENCOUNTER — Encounter: Payer: Self-pay | Admitting: Internal Medicine

## 2023-10-29 ENCOUNTER — Ambulatory Visit: Payer: Medicare PPO | Admitting: Internal Medicine

## 2023-10-29 ENCOUNTER — Other Ambulatory Visit (HOSPITAL_BASED_OUTPATIENT_CLINIC_OR_DEPARTMENT_OTHER): Payer: Self-pay

## 2023-10-29 VITALS — BP 150/80 | HR 95 | Temp 98.5°F | Ht 63.0 in

## 2023-10-29 DIAGNOSIS — F419 Anxiety disorder, unspecified: Secondary | ICD-10-CM | POA: Diagnosis not present

## 2023-10-29 DIAGNOSIS — K5732 Diverticulitis of large intestine without perforation or abscess without bleeding: Secondary | ICD-10-CM

## 2023-10-29 DIAGNOSIS — M05741 Rheumatoid arthritis with rheumatoid factor of right hand without organ or systems involvement: Secondary | ICD-10-CM | POA: Diagnosis not present

## 2023-10-29 DIAGNOSIS — M05742 Rheumatoid arthritis with rheumatoid factor of left hand without organ or systems involvement: Secondary | ICD-10-CM

## 2023-10-29 LAB — URINALYSIS, ROUTINE W REFLEX MICROSCOPIC
Bilirubin Urine: NEGATIVE
Glucose, UA: NEGATIVE mg/dL
Hgb urine dipstick: NEGATIVE
Ketones, ur: 15 mg/dL — AB
Leukocytes,Ua: NEGATIVE
Nitrite: NEGATIVE
Protein, ur: NEGATIVE mg/dL
Specific Gravity, Urine: 1.01 (ref 1.005–1.030)
pH: 6 (ref 5.0–8.0)

## 2023-10-29 MED ORDER — AMOXICILLIN-POT CLAVULANATE 875-125 MG PO TABS: 1.0000 | ORAL_TABLET | Freq: Two times a day (BID) | ORAL | 0 refills | Status: DC

## 2023-10-29 MED ORDER — FLUCONAZOLE 150 MG PO TABS
150.0000 mg | ORAL_TABLET | Freq: Once | ORAL | 0 refills | Status: AC
  Filled 2023-10-29: qty 1, 1d supply, fill #0

## 2023-10-29 MED ORDER — FLUCONAZOLE 150 MG PO TABS: 150.0000 mg | ORAL_TABLET | Freq: Every day | ORAL | 0 refills | Status: DC

## 2023-10-29 MED ORDER — AMOXICILLIN-POT CLAVULANATE 875-125 MG PO TABS
1.0000 | ORAL_TABLET | Freq: Two times a day (BID) | ORAL | 0 refills | Status: DC
  Filled 2023-10-29: qty 20, 10d supply, fill #0

## 2023-10-29 MED ORDER — PIPERACILLIN-TAZOBACTAM 3.375 G IVPB 30 MIN
3.3750 g | Freq: Once | INTRAVENOUS | Status: AC
  Administered 2023-10-29: 3.375 g via INTRAVENOUS
  Filled 2023-10-29: qty 50

## 2023-10-29 NOTE — Discharge Instructions (Addendum)
 Take the antibiotic as prescribed.  Start with clear liquid diet and advance slowly as tolerated.  Follow-up with your doctor.  Return to the ED for worsening pain, fever, vomiting, or other concerns.

## 2023-10-29 NOTE — Progress Notes (Signed)
 Patient Care Team: Pamela Pamela PARAS, MD as PCP - General (Internal Medicine) Pamela Ingram, RD as Dietitian (Family Medicine)  Visit Date: 10/29/23  Subjective:  No chief complaint on file.  Patient Pamela Ingram,Female DOB:July 02, 1951,72 y.o.FMW:[id number]   72 y.o. Female presents today for ED follow-up from 10/28/23 for LLQ abdominal pain. Patient has a past medical history of diverticula status post laparoscopy and hysterectomy.   UA: ketones 15; CMP: Potassium 3.4, Blood Glucose 100; CBC: WBC 14.1, Neutro Abs 10.9, Monocytes Absolute 1.1  CT Abdomen/Pelvis: changes consistent with sigmoid diverticulitis without evidence of perforation or abscess formation  Given 162 mg Tocilizumab  and Retin-A  0.05% cream in-clinic. Was prescribed 875-125 mg Augmentin  and 150 mg Diflucan .   Endorses persisting abdominal pain and tenderness, and did have 1 instance of chills that has resolved, since leaving the hospital. Also endorses bloating Denies nausea/vomiting, constipation, fever, or shaking. Expressed concern over her health due difficulty contacting her PCP office. She states that she has not been able to pick up the prescription of Augmentin  and Diflucan . Notes that she has not had a bowel movement since leaving ED, but is passing gas, and did have a bowel movement yesterday.    Past Medical History:  Diagnosis Date   Anxiety    Hyperlipidemia    Melanoma in situ of left upper extremity (HCC) 04/2023   Pleurisy with effusion    Rheumatoid arthritis(714.0)    Thyroid  disease    hypothyroidism    Family History  Problem Relation Age of Onset   Emphysema Mother    Rheum arthritis Mother    Hypertension Father    Stroke Father    Diabetes Paternal Uncle    Colon cancer Neg Hx    Rectal cancer Neg Hx    Stomach cancer Neg Hx    Esophageal cancer Neg Hx    Cancer Neg Hx    Social History   Social History Narrative      Social:Does not smoke. Former smoker.  Social alcohol  consumption. EtOH 1-2 drinks/week. 3 caffeine/day. She is married.  2 adult sons.  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  Father died of complications of sepsis.  He had a stroke.  Mother died of respiratory complications and had history of rheumatoid arthritis.  1 sister living.  No brothers.   Review of Systems  Constitutional:  Negative for fever and malaise/fatigue.  HENT:  Negative for congestion.   Eyes:  Negative for blurred vision.  Respiratory:  Negative for cough and shortness of breath.   Cardiovascular:  Negative for chest pain, palpitations and leg swelling.  Gastrointestinal:  Negative for nausea and vomiting.  Musculoskeletal:  Negative for back pain.  Skin:  Negative for rash.  Neurological:  Negative for loss of consciousness and headaches.     Objective:  Vitals: There were no vitals taken for this visit. Physical Exam Vitals and nursing note reviewed.  Constitutional:      General: She is not in acute distress.    Appearance: Normal appearance. She is not ill-appearing or toxic-appearing.  HENT:     Head: Normocephalic and atraumatic.     Right Ear: Hearing, tympanic membrane, ear canal and external ear normal.     Left Ear: Hearing, tympanic membrane, ear canal and external ear normal.     Mouth/Throat:     Pharynx: Oropharynx is  clear.  Eyes:     Extraocular Movements: Extraocular movements intact.     Pupils: Pupils are equal, round, and reactive to light.  Neck:     Thyroid : No thyroid  mass, thyromegaly or thyroid  tenderness.     Vascular: No carotid bruit.  Cardiovascular:     Rate and Rhythm: Normal rate and regular rhythm. No extrasystoles are present.    Pulses:          Dorsalis pedis pulses are 1+ on the right side and 1+ on the left side.     Heart sounds: Normal heart sounds. No murmur heard.    No friction rub. No gallop.  Pulmonary:      Effort: Pulmonary effort is normal.     Breath sounds: Normal breath sounds. No decreased breath sounds, wheezing, rhonchi or rales.  Chest:     Chest wall: No mass.  Abdominal:     General: There is distension.     Palpations: Abdomen is soft. There is no hepatomegaly, splenomegaly or mass.     Tenderness: There is abdominal tenderness (LLQ) in the left lower quadrant.     Hernia: No hernia is present.  Musculoskeletal:     Cervical back: Normal range of motion.     Right lower leg: No edema.     Left lower leg: No edema.  Lymphadenopathy:     Cervical: No cervical adenopathy.     Upper Body:     Right upper body: No supraclavicular adenopathy.     Left upper body: No supraclavicular adenopathy.  Skin:    General: Skin is warm and dry.  Neurological:     General: No focal deficit present.     Mental Status: She is alert and oriented to person, place, and time. Mental status is at baseline.     Sensory: Sensation is intact.     Motor: Motor function is intact. No weakness.     Deep Tendon Reflexes: Reflexes are normal and symmetric.  Psychiatric:        Attention and Perception: Attention normal.        Mood and Affect: Mood normal.        Speech: Speech normal.        Behavior: Behavior normal.        Thought Content: Thought content normal.        Cognition and Memory: Cognition normal.        Judgment: Judgment normal.     Results:  Studies obtained and personally reviewed by me:  CT ABDOMEN AND PELVIS WITH CONTRAST: Changes consistent with sigmoid diverticulitis without evidence of perforation or abscess formation.   Labs:     Component Value Date/Time   NA 137 10/28/2023 2224   NA 142 05/31/2017 0907   K 3.4 (L) 10/28/2023 2224   CL 103 10/28/2023 2224   CO2 26 10/28/2023 2224   GLUCOSE 100 (H) 10/28/2023 2224   BUN 14 10/28/2023 2224   BUN 21 05/31/2017 0907   CREATININE 0.86 10/28/2023 2224   CREATININE 0.94 01/19/2023 0944   CALCIUM  9.1 10/28/2023 2224    PROT 7.3 10/28/2023 2224   ALBUMIN 4.2 10/28/2023 2224   AST 23 10/28/2023 2224   ALT 17 10/28/2023 2224   ALKPHOS 56 10/28/2023 2224   BILITOT 0.7 10/28/2023 2224   GFRNONAA >60 10/28/2023 2224   GFRNONAA 79 12/05/2020 1122   GFRAA 92 12/05/2020 1122    Lab Results  Component Value Date   WBC 14.1 (H)  10/28/2023   HGB 13.0 10/28/2023   HCT 38.4 10/28/2023   MCV 88.9 10/28/2023   PLT 177 10/28/2023   Lab Results  Component Value Date   CHOL 238 (H) 08/10/2023   HDL 70 08/10/2023   LDLCALC 145 (H) 08/10/2023   TRIG 113 08/10/2023   CHOLHDL 3.4 08/10/2023   Lab Results  Component Value Date   TSH 1.68 08/10/2023    Assessment & Plan:   Acute Diverticulitis: Endorses persisting abdominal pain and tenderness, and did have 1 instance of chills that has resolved, since leaving the hospital. Also endorses bloating. Has not been able to pick up the prescription of Augmentin  and Diflucan . Has not had a bowel movement since leaving ED, but is passing gas, and did have a bowel movement yesterday. Sending in 875-125 mg Augmentin  and 150 mg Diflucan  in case of yeast infection. Advance diet slowly.    I,Emily Lagle,acting as a neurosurgeon for Pamela JINNY Hailstone, MD.,have documented all relevant documentation on the behalf of Pamela JINNY Hailstone, MD,as directed by  Pamela JINNY Hailstone, MD while in the presence of Pamela JINNY Hailstone, MD.   I, Pamela JINNY Hailstone, MD, have reviewed all documentation for this visit. The documentation on 11/07/23 for the exam, diagnosis, procedures, and orders are all accurate and complete.

## 2023-10-29 NOTE — ED Provider Notes (Signed)
 [facility] Provider Note   CSN: [id number] Arrival date & time: 10/28/23  1914     History  Chief Complaint  Patient presents with   Abdominal Pain    Pamela Ingram is a 73 y.o. female.  With a history of diverticula status post laparoscopy and hysterectomy who presents to the ED for abdominal pain.  2 days of ongoing abdominal pain most prominent over left lower quadrant.  Reports abdominal distention with feelings of gas and cramping.  Some loose stool earlier today.  No nausea vomiting fevers or chills.  No urinary symptoms.  Has known diverticula but never treated for acute diverticulitis.   Abdominal Pain      Home Medications Prior to Admission medications   Medication Sig Start Date End Date Taking? Authorizing Provider  ALPRAZolam  (XANAX ) 0.5 MG tablet Take 1 tablet (0.5 mg total) by mouth every morning as needed 09/15/23     Apremilast  (OTEZLA ) 30 MG TABS Take 1 tablet (30 mg total) by mouth 2 (two) times daily. 05/04/23     hydroquinone  4 % cream Apply to affected dark areas twice daily. Stop if you notice darkening. Use sunscreen. 05/04/23     ibuprofen  (ADVIL ,MOTRIN ) 200 MG tablet Take 800 mg by mouth every 8 (eight) hours as needed for moderate pain.    [provider]  levothyroxine  (SYNTHROID ) 75 MCG tablet Take 1 tablet (75 mcg total) by mouth daily. 09/08/23 09/07/24  Perri Ronal PARAS, MD  rosuvastatin  (CRESTOR ) 20 MG tablet Take 1 tablet (20 mg total) by mouth daily. 10/08/23   Croitoru, Mihai, MD  sertraline  (ZOLOFT ) 100 MG tablet Take 2 tablets (200 mg total) by mouth in the morning. 09/15/23     Tocilizumab  (ACTEMRA ) 162 MG/0.9ML SOSY Inject 0.9 ml (1 syringe) subcutaneously every week 06/13/18   Jegede, Olugbemiga E, MD  tretinoin  (RETIN-A ) 0.05 % cream Apply a pea sized amount to entire face and neck daily at bedtime. Avoid use during the day. Can mix with 3 pea sized amounts of moisturizer to reduce irritation.  Can start using a few times a week and increase to nightly use as tolerated. Use sunscreen daily and reapply if out in the sun for over 2 hours. 05/04/23         Allergies    Amoxicillin     Review of Systems   Review of Systems  Gastrointestinal:  Positive for abdominal pain.    Physical Exam Updated Vital Signs BP (!) 161/71 (BP Location: Left Arm)   Pulse 84   Temp 98.3 F (36.8 C)   Resp 16   Ht 5' 3 (1.6 m)   Wt 69.8 kg   SpO2 100%   BMI 27.26 kg/m  Physical Exam Vitals and nursing note reviewed.  HENT:     Head: Normocephalic and atraumatic.  Eyes:     Pupils: Pupils are equal, round, and reactive to light.  Cardiovascular:     Rate and Rhythm: Normal rate and regular rhythm.  Pulmonary:     Effort: Pulmonary effort is normal.     Breath sounds: Normal breath sounds.  Abdominal:     Palpations: Abdomen is soft.     Tenderness: There is abdominal tenderness in the left lower quadrant. There is no guarding or rebound.  Skin:    General: Skin is warm and dry.  Neurological:     Mental Status: She is alert.  Psychiatric:        Mood and  Affect: Mood normal.     ED Results / Procedures / Treatments   Labs (all labs ordered are listed, but only abnormal results are displayed) Labs Reviewed  COMPREHENSIVE METABOLIC PANEL - Abnormal; Notable for the following components:      Result Value   Potassium 3.4 (*)    Glucose, Bld 100 (*)    All other components within normal limits  CBC WITH DIFFERENTIAL/PLATELET - Abnormal; Notable for the following components:   WBC 14.1 (*)    Neutro Abs 10.9 (*)    Monocytes Absolute 1.1 (*)    All other components within normal limits  LIPASE, BLOOD  URINALYSIS, ROUTINE W REFLEX MICROSCOPIC    EKG None  Radiology No results found.  Procedures Procedures    Medications Ordered in ED Medications  iohexol  (OMNIPAQUE ) 300 MG/ML solution 100 mL (100 mLs Intravenous Contrast Given 10/28/23 2324)    ED Course/ Medical  Decision Making/ A&P Clinical Course as of 10/29/23 0009  Fri Oct 29, 2023  0008 LILLETTE Ozell Marine DO, am transitioning care of this patient to the oncoming provider pending CT abdomen pelvis, reevaluation and disposition [MP]    Clinical Course User Index [MP] Marine Ozell LABOR, DO                                 Medical Decision Making 73 year old female with history as above presenting for 2 days of abdominal pain.  Cramping pain most prominent over left lower quadrant.  History of diverticula but never diverticulitis.  Afebrile slightly hypertensive here.  Well-appearing overall.  Exam notable for left lower quadrant tenderness.    Differential diagnosis includes: Acute intra-abdominal infectious/inflammatory process such as appendicitis, diverticulitis, pancreatitis and cholecystitis Urinary tract infection Atypical presentation for pneumonia Viral gastroenteritis  Will obtain laboratory workup including CBC with differential, metabolic panel, lipase and urinalysis along with CT abdomen pelvis to evaluate next  Patient declined offer for analgesia at this time  Amount and/or Complexity of Data Reviewed Labs: ordered. Radiology: ordered.  Risk Prescription drug management.           Final Clinical Impression(s) / ED Diagnoses Final diagnoses:  Left lower quadrant abdominal pain    Rx / DC Orders ED Discharge Orders     None         Marine Ozell LABOR, DO 10/29/23 0009

## 2023-10-29 NOTE — ED Provider Notes (Signed)
 Care assumed from Dr. Pamella.  Patient with diverticula here with left-sided lower abdominal pain.  Pending CT scan.  CT shows diverticulitis without evidence of perforation or abscess.  Patient states she has tolerated Augmentin  in the past.  Does not have penicillin allergy but does get yeast infections.  Give dose of Zosyn  and plan for discharge with Augmentin .   Pamela Senior, MD 10/29/23 [phone]

## 2023-11-02 ENCOUNTER — Encounter: Payer: Self-pay | Admitting: Internal Medicine

## 2023-11-02 ENCOUNTER — Other Ambulatory Visit (HOSPITAL_BASED_OUTPATIENT_CLINIC_OR_DEPARTMENT_OTHER): Payer: Self-pay

## 2023-11-02 ENCOUNTER — Telehealth: Payer: Self-pay | Admitting: Internal Medicine

## 2023-11-02 ENCOUNTER — Telehealth: Payer: Medicare PPO | Admitting: Internal Medicine

## 2023-11-02 DIAGNOSIS — B3731 Acute candidiasis of vulva and vagina: Secondary | ICD-10-CM | POA: Diagnosis not present

## 2023-11-02 MED ORDER — FLUCONAZOLE 150 MG PO TABS
150.0000 mg | ORAL_TABLET | Freq: Once | ORAL | 0 refills | Status: AC
  Filled 2023-11-02: qty 1, 1d supply, fill #0

## 2023-11-02 MED ORDER — FLUCONAZOLE 150 MG PO TABS
150.0000 mg | ORAL_TABLET | Freq: Once | ORAL | 1 refills | Status: DC
  Filled 2023-11-02: qty 1, 1d supply, fill #0

## 2023-11-02 NOTE — Telephone Encounter (Signed)
 Err

## 2023-11-02 NOTE — Telephone Encounter (Signed)
 Pamela Ingram called to say she thinks she is getting a yeast infection from the antibiotic, She is beginning to itch and feel uncomfortable in her vaginal area. Can she get a prescription for this sent in.

## 2023-11-02 NOTE — Progress Notes (Addendum)
 I connected with Pamela Ingram today by telephone as video visit attempt failed.  She is at her home and I am at my office.  She is identified using 2 identifiers as Pamela Ingram, a patient in this practice.  She is agreeable to speaking with me today in this format.  Patient was seen in the emergency department with acute left lower quadrant abdominal pain on January 2 and was thought to have acute diverticulitis and was placed on antibiotics.  She was seen in follow-up here the following day January 3 and had improved.  She now is having vaginal itching on antibiotics.  Has been taking Augmentin  every 12 hours as prescribed by emergency department physician on January 2.  Spoke with patient today by phone briefly.  She is having vaginal itching and symptoms are consistent with Candida vaginitis from the antibiotic.  I have prescribed Diflucan  150 mg tablet with 1 refill.  Time spent with chart review, speaking with patient by phone, medical decision making and E scribing medication is 10 minutes   I, Ronal JINNY Hailstone, MD, have reviewed all documentation for this visit. The documentation on 11/02/23 for the exam, diagnosis, procedures, and orders are all accurate and complete.

## 2023-11-02 NOTE — Telephone Encounter (Signed)
 done

## 2023-11-02 NOTE — Patient Instructions (Signed)
 Have prescribed Diflucan 150 mg tablet with one refill to [facility]

## 2023-11-07 NOTE — Patient Instructions (Addendum)
 Here for follow up s/p ED visit for acute sigmoid diverticulitis. Have sent in Augmentin  875mg  twice daily for 10 days along with Diflucan . Rest, stay well hydrated and advance diet slowly. Call if symptoms worsen or not improving in 48 hours. Continue Actemra  as directed may want to skip this week's dose.

## 2023-11-12 DIAGNOSIS — F411 Generalized anxiety disorder: Secondary | ICD-10-CM | POA: Diagnosis not present

## 2023-11-15 ENCOUNTER — Other Ambulatory Visit (HOSPITAL_BASED_OUTPATIENT_CLINIC_OR_DEPARTMENT_OTHER): Payer: Self-pay

## 2023-11-15 ENCOUNTER — Other Ambulatory Visit: Payer: Self-pay

## 2023-11-29 ENCOUNTER — Ambulatory Visit (HOSPITAL_BASED_OUTPATIENT_CLINIC_OR_DEPARTMENT_OTHER)
Admission: RE | Admit: 2023-11-29 | Discharge: 2023-11-29 | Disposition: A | Discharge status: Home / Self Care | Payer: Medicare PPO | Source: Ambulatory Visit | Attending: Internal Medicine | Admitting: Internal Medicine

## 2023-11-29 ENCOUNTER — Encounter (HOSPITAL_BASED_OUTPATIENT_CLINIC_OR_DEPARTMENT_OTHER): Payer: Self-pay

## 2023-11-29 DIAGNOSIS — Z1231 Encounter for screening mammogram for malignant neoplasm of breast: Secondary | ICD-10-CM | POA: Insufficient documentation

## 2023-12-08 ENCOUNTER — Ambulatory Visit: Payer: Self-pay | Admitting: Internal Medicine

## 2023-12-08 NOTE — Telephone Encounter (Signed)
Copied from CRM [id number]. Topic: Clinical - Red Word Triage >> Dec 08, 2023  3:15 PM Gildardo Pounds wrote: Red Word that prompted transfer to Nurse Triage: Possible bladder infection; burning when urinating and fullness in lower abdomen. Callback number is [phone]  Chief Complaint: difficult call Symptoms: rude, mentioning black & white, raising voice, , talking over Nurse, not wanting to answer questions,  Frequency: ongoing throughout call Pertinent Negatives: Patient denies n/a Disposition: [] ED /[] Urgent Care (no appt availability in office) / [] Appointment(In office/virtual)/ []  [facility]/ [] Home Care/ [] Refused Recommended Disposition /[] [facility]/ [x]  Follow-up with PCP Additional Notes: unable to fully triage patient for possible urinary problems due difficult call. Please call patient .  Reason for Disposition  [1] Angry or rude caller AND [2] doesn't respond to 5 minutes of triager counseling AND [3] sick adult (or caller)  Answer Assessment - Initial Assessment Questions 1. SITUATION:  Document reason for call.     Pt call to report s/s of possible bladder infection.  Agent had already forewarned that pt was upset and not pleasant.  Once I began the call with the patient I asked the patient to verify the name and DOB: patient immediately began questioning "How many times do I need to give this information - did the other person not give you the information?".  I attempted to explain each time the patient speaks to a different person the patient would be asked to verify the needed information such as name and DOB again in order to ensure we are speaking to the correct person.  The patient continue to raise her voice while complaining about the new system and how she does not like it and how she has been home suffering with her issues because she did not want to call and have to go through this process.  She stated she only wants to talk to people she knows and  should could her doctor/nurse directly but she knows they have a process to go through so she is trying to go through the steps the correct way - the entire the time the patient is speaking she is very rude.  I attempted to reassure her if we work together nicely in a calm manner - I help her schedule an appointment but I would need to answer some questions first. The lady told me I don't need to try to be nice or anything: she basically does not need that: she only wants me to be cut and dry and leave all that other stuff out.  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 The lady asked it I could look over her being rude because she was upset at the situation and do what she asked: you are black and I am white -I just stopped the conversation and stated I don't know what that has to do with anything and you don't even know if I am black or not - however I am going to end the conversation because I will not allow her to continue to be rude and jI would route the information over to her PCP office and ask them to call you back to see if they can help you further.   2. BACKGROUND: Document any background information (e.g., prior calls, known psychiatric history)     N/a 3. ASSESSMENT: Document your nursing assessment.     Urination frequency, burning w/urination - then unable to gather any  other information from patient 4. RESPONSE: Document what your response or recommendation was.     N/a  Protocols used: Difficult Call-A-AH

## 2023-12-09 ENCOUNTER — Ambulatory Visit: Payer: Medicare PPO | Admitting: Internal Medicine

## 2023-12-09 ENCOUNTER — Other Ambulatory Visit: Payer: Self-pay

## 2023-12-09 ENCOUNTER — Encounter: Payer: Self-pay | Admitting: Internal Medicine

## 2023-12-09 ENCOUNTER — Other Ambulatory Visit (HOSPITAL_BASED_OUTPATIENT_CLINIC_OR_DEPARTMENT_OTHER): Payer: Self-pay

## 2023-12-09 VITALS — BP 120/78 | HR 80 | Temp 98.8°F | Ht 63.0 in | Wt 156.0 lb

## 2023-12-09 DIAGNOSIS — N342 Other urethritis: Secondary | ICD-10-CM

## 2023-12-09 DIAGNOSIS — R300 Dysuria: Secondary | ICD-10-CM

## 2023-12-09 LAB — POCT URINALYSIS DIP (CLINITEK)
Bilirubin, UA: NEGATIVE
Blood, UA: NEGATIVE
Glucose, UA: NEGATIVE mg/dL
Ketones, POC UA: NEGATIVE mg/dL
Leukocytes, UA: NEGATIVE
Nitrite, UA: NEGATIVE
POC PROTEIN,UA: NEGATIVE
Spec Grav, UA: 1.01 (ref 1.010–1.025)
Urobilinogen, UA: 0.2 U/dL
pH, UA: 7 (ref 5.0–8.0)

## 2023-12-09 MED ORDER — CIPROFLOXACIN HCL 250 MG PO TABS
250.0000 mg | ORAL_TABLET | Freq: Two times a day (BID) | ORAL | 0 refills | Status: DC
Start: 2023-12-09 — End: 2024-01-28
  Filled 2023-12-09: qty 6, 3d supply, fill #0

## 2023-12-09 NOTE — Progress Notes (Signed)
 Patient Care Team: Margaree Mackintosh, MD as PCP - General (Internal Medicine) Linna Darner, RD as Dietitian (Family Medicine)  Visit Date: 12/09/23  Subjective:   Chief Complaint  Patient presents with   BURNING WITH URINATION    Going on for 4 days.    Urinary Frequency       Patient WU:Pamela Ingram W Stofko,Female DOB:Nov 18, 1950,73 y.o. BJY:[id number]   73 y.o. Female presents today for acute sick visit with Dysuria x4 days and Urinary Frequency. Endorses urgency, however notes that this does occasionally occur outside of this event. Denies fever.   Past Medical History:  Diagnosis Date   Anxiety    Hyperlipidemia    Melanoma in situ of left upper extremity (HCC) 04/2023   Pleurisy with effusion    Rheumatoid arthritis(714.0)    Thyroid disease    hypothyroidism    Allergies  Allergen Reactions   Amoxicillin Other (See Comments)    Pt gets yeast infection Has patient had a PCN reaction causing immediate rash, facial/tongue/throat swelling, SOB or lightheadedness with hypotension: No Has patient had a PCN reaction causing severe rash involving mucus membranes or skin necrosis: No Has patient had a PCN reaction that required hospitalization: No Has patient had a PCN reaction occurring within the last 10 years: No If all of the above answers are "NO", then may proceed with Cephalosporin use.     Family History  Problem Relation Age of Onset   Emphysema Mother    Rheum arthritis Mother    Hypertension Father    Stroke Father    Diabetes Paternal Uncle    Colon cancer Neg Hx    Rectal cancer Neg Hx    Stomach cancer Neg Hx    Esophageal cancer Neg Hx    Cancer Neg Hx    Social History   Social History Narrative      Social:Does not smoke. Former smoker.  Social alcohol consumption. EtOH 1-2 drinks/week. 3 caffeine/day. She is married.  2 adult sons.  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  Father died of complications of sepsis.  He had a stroke.  Mother died of respiratory complications and had history of rheumatoid arthritis.  1 sister living.  No brothers.   Review of Systems  Constitutional:  Negative for fever.  Genitourinary:  Positive for dysuria (burning), frequency and urgency.     Objective:  Vitals: BP 120/78   Pulse 80   Temp 98.8 F (37.1 C)   Ht 5\' 3"  (1.6 m)   Wt 156 lb (70.8 kg)   SpO2 96%   BMI 27.63 kg/m   Physical Exam Vitals and nursing note reviewed.  Constitutional:      General: She is not in acute distress.    Appearance: Normal appearance. She is not toxic-appearing.  HENT:     Head: Normocephalic and atraumatic.  Pulmonary:     Effort: Pulmonary effort is normal.  Skin:    General: Skin is warm and dry.  Neurological:     Mental Status: She is alert and oriented to person, place, and time. Mental status is at baseline.  Psychiatric:        Mood and Affect: Mood normal.        Behavior: Behavior normal.        Thought Content: Thought content normal.        Judgment:  Judgment normal.     Results:  Studies Obtained And Personally Reviewed By Me: Labs:     Component Value Date/Time   NA 137 10/28/2023 2224   NA 142 05/31/2017 0907   K 3.4 (L) 10/28/2023 2224   CL 103 10/28/2023 2224   CO2 26 10/28/2023 2224   GLUCOSE 100 (H) 10/28/2023 2224   BUN 14 10/28/2023 2224   BUN 21 05/31/2017 0907   CREATININE 0.86 10/28/2023 2224   CREATININE 0.94 01/19/2023 0944   CALCIUM 9.1 10/28/2023 2224   PROT 7.3 10/28/2023 2224   ALBUMIN 4.2 10/28/2023 2224   AST 23 10/28/2023 2224   ALT 17 10/28/2023 2224   ALKPHOS 56 10/28/2023 2224   BILITOT 0.7 10/28/2023 2224   GFRNONAA >60 10/28/2023 2224   GFRNONAA 79 12/05/2020 1122   GFRAA 92 12/05/2020 1122    Lab Results  Component Value Date   WBC 14.1 (H) 10/28/2023   HGB 13.0 10/28/2023   HCT 38.4 10/28/2023   MCV 88.9 10/28/2023   PLT 177  10/28/2023   Lab Results  Component Value Date   CHOL 238 (H) 08/10/2023   HDL 70 08/10/2023   LDLCALC 145 (H) 08/10/2023   TRIG 113 08/10/2023   CHOLHDL 3.4 08/10/2023   No results found for: "HGBA1C"  Lab Results  Component Value Date   TSH 1.68 08/10/2023    Results for orders placed or performed in visit on 12/09/23  POCT URINALYSIS DIP (CLINITEK)  Result Value Ref Range   Color, UA yellow yellow   Clarity, UA clear clear   Glucose, UA negative negative mg/dL   Bilirubin, UA negative negative   Ketones, POC UA negative negative mg/dL   Spec Grav, UA 6.578 4.696 - 1.025   Blood, UA negative negative   pH, UA 7.0 5.0 - 8.0   POC PROTEIN,UA negative negative, trace   Urobilinogen, UA 0.2 0.2 or 1.0 E.U./dL   Nitrite, UA Negative Negative   Leukocytes, UA Negative Negative   Assessment & Plan:   Orders Placed This Encounter  Procedures   Urine Culture   POCT URINALYSIS DIP (CLINITEK)   Burning w/ Urination: onset x4 days ago. UA normal, sent in for a culture. In the meantime sending in 250 mg Cipro - take 1 tablet (250 mg total) by mouth 2 (two) times daily for 3 days pending culture results. Let us know if symptoms persist/worsen despite treatment. Dx = Urethritis vs UTI  Addendum :Urine culture had no growth   I,Emily Lagle,acting as a scribe for Margaree Mackintosh, MD.,have documented all relevant documentation on the behalf of Margaree Mackintosh, MD,as directed by  Margaree Mackintosh, MD while in the presence of Margaree Mackintosh, MD.   I, Margaree Mackintosh, MD, have reviewed all documentation for this visit. The documentation on 12/24/23 for the exam, diagnosis, procedures, and orders are all accurate and complete.

## 2023-12-09 NOTE — Telephone Encounter (Signed)
Scheduled for today.

## 2023-12-10 ENCOUNTER — Other Ambulatory Visit (HOSPITAL_BASED_OUTPATIENT_CLINIC_OR_DEPARTMENT_OTHER): Payer: Self-pay

## 2023-12-10 ENCOUNTER — Ambulatory Visit: Payer: Medicare PPO | Admitting: Internal Medicine

## 2023-12-10 LAB — URINE CULTURE
MICRO NUMBER:: 16080616
Result:: NO GROWTH
SPECIMEN QUALITY:: ADEQUATE

## 2023-12-13 DIAGNOSIS — F411 Generalized anxiety disorder: Secondary | ICD-10-CM | POA: Diagnosis not present

## 2023-12-24 NOTE — Patient Instructions (Addendum)
 Please take Cipro 250 mg twice daily x 3 days pending urine culture results.

## 2023-12-28 ENCOUNTER — Other Ambulatory Visit: Payer: Self-pay

## 2023-12-28 ENCOUNTER — Other Ambulatory Visit (HOSPITAL_BASED_OUTPATIENT_CLINIC_OR_DEPARTMENT_OTHER): Payer: Self-pay

## 2023-12-28 DIAGNOSIS — F411 Generalized anxiety disorder: Secondary | ICD-10-CM | POA: Diagnosis not present

## 2023-12-28 MED ORDER — ALPRAZOLAM 0.5 MG PO TABS
0.5000 mg | ORAL_TABLET | Freq: Every morning | ORAL | 1 refills | Status: DC
  Filled 2023-12-28: qty 30, 30d supply, fill #0

## 2023-12-28 MED ORDER — FLUVOXAMINE MALEATE ER 100 MG PO CP24
ORAL_CAPSULE | ORAL | 0 refills | Status: DC
  Filled 2023-12-28: qty 35, 28d supply, fill #0

## 2023-12-29 ENCOUNTER — Other Ambulatory Visit (HOSPITAL_BASED_OUTPATIENT_CLINIC_OR_DEPARTMENT_OTHER): Payer: Self-pay

## 2023-12-30 ENCOUNTER — Other Ambulatory Visit (HOSPITAL_BASED_OUTPATIENT_CLINIC_OR_DEPARTMENT_OTHER): Payer: Self-pay

## 2023-12-30 MED ORDER — FLUVOXAMINE MALEATE ER 150 MG PO CP24
150.0000 mg | ORAL_CAPSULE | Freq: Every day | ORAL | 1 refills | Status: DC
  Filled 2023-12-30 – 2023-12-31 (×2): qty 30, 30d supply, fill #0
  Filled 2024-01-24: qty 30, 30d supply, fill #1

## 2023-12-31 ENCOUNTER — Other Ambulatory Visit (HOSPITAL_COMMUNITY): Payer: Self-pay

## 2023-12-31 ENCOUNTER — Other Ambulatory Visit (HOSPITAL_BASED_OUTPATIENT_CLINIC_OR_DEPARTMENT_OTHER): Payer: Self-pay

## 2024-01-07 ENCOUNTER — Other Ambulatory Visit (HOSPITAL_BASED_OUTPATIENT_CLINIC_OR_DEPARTMENT_OTHER): Payer: Self-pay

## 2024-01-12 NOTE — Progress Notes (Signed)
 Annual Medicare Wellness Visit   Patient Care Team: Kaleo Condrey, Jaynie Meyers, MD as PCP - General (Internal Medicine) Dorothe Gaster, RD as Dietitian (Family Medicine)  Visit Date: 01/28/24   Chief Complaint  Patient presents with   Annual Exam   Subjective:  Patient: Pamela Ingram, Female DOB: 07-08-1951, 73 y.o. MRN: [id number] Pamela Ingram is a 73 y.o. Female who presents today for her Annual Medicare Wellness Visit. Patient has history of Depression W/ Anxiety; Hypercholesterolemia; Hypothyroid; Rheumatoid Arthritis; Overweight (BMI 25.0-29.9); Lichen Sclerosus et Atrophicus; Allergic Angioedema; Angioedema of Lips; Anxiety; Esophageal Reflux; Osteopenia; Rash; Seropositive Erosive Rheumatoid Arthritis; False Positive Cardiac Stress Test; and Atherosclerosis of Aorta.  History of Pure Cholesterolemia treated with Rosuvastatin  20 mg daily. 2023 Coronary Calcium  Score of 0. 01/24/2024 Lipid Panel: WNL. Followed by Dr. Luana Rumple, Cardiologist.   History of Anxiety/Depression treated with Xanax  0.5 mg as needed and Fluvoxamine  Maleate 150 mg nightly. Followed by Linde Reveal w/ [facility].  History of Hypothyroidism treated with Levothyroxine  75 mcg. 01/24/2024 TSH: 1.92.  History of Rheumatoid Arthritis treated with Otezla  30 mg twice daily. History of Psoriasis treated with Actemra  fortnightly. She says that she feels like she doesn't even have Arthritis most days, and if her joints aren't bothering her she will take her Actemra  every other week instead of weekly. Followed by [facility] in [place].    Labs 01/24/2024 CBC: WNL CMP: WNL   Mammogram 11/29/2023 normal with repeat recommendation of 2026.  Colonoscopy 02/23/2022 found Diverticulosis in sigmoid colon; exam otherwise normal. Repeat only as needed per Dr. Willy Harvest. History of IBS with infrequent episodes of cramps and stool urgency according to Dr. Jadene Maxwell note on 07/14/2023.   Bone Density due.   Vaccine  Counseling: UTD on Flu, PNA, and Tdap. Past Medical History:  Diagnosis Date   Anxiety    Hyperlipidemia    Melanoma in situ of left upper extremity (HCC) 04/2023   Pleurisy with effusion    Rheumatoid arthritis(714.0)    Thyroid  disease    hypothyroidism  Medical/Surgical History Narrative:  2018 - Normal Cardiac Catheterization in May    2014 - Quit smoking. Developed Pleurisy w/ Pleural Effusion, was seen by [facility], and treated w/ Prednisone . Family History  Problem Relation Age of Onset   Emphysema Mother    Rheum arthritis Mother    Hypertension Father    Stroke Father    Diabetes Paternal Uncle    Colon cancer Neg Hx    Rectal cancer Neg Hx    Stomach cancer Neg Hx    Esophageal cancer Neg Hx    Cancer Neg Hx    Social History   Social History Narrative   Former smoker, quit 2014. Never used smokeless tobacco or illicit drugs. Does consume alcohol socially - EtOH 1-2 drinks/week. 3 cups of caffeine/day. Married. Has 2 adult sons. 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    2025 - She has been helping take care of her neighbor, who is 57 y.o.      Fhx:   No Fhx of Premature Cardiac Illness, Sudden Death, GI Cancers (Esophageal, Stomach, Colon, or Rectal).    Mother deceased due to Respiratory Complications w/ hx of Emphysema, Rheumatoid Arthritis, Bradycardia and a Pacemaker.   Father deceased due to Complications of Sepsis w/ hx of Stroke in his 64s and Hypertension,   Paternal Uncle w/ hx of Diabetes.   2 Paternal Aunts underwent Endarterectomy in their 27s.  Sister deceased of Breast Cancer.   1 Sister living   Review of Systems  Constitutional:  Negative for chills, fever, malaise/fatigue and weight loss.  HENT:  Negative for hearing loss, sinus pain and sore throat.   Respiratory:  Negative for cough, hemoptysis and shortness of breath.   Cardiovascular:  Negative for chest pain, palpitations, leg swelling and PND.   Gastrointestinal:  Negative for abdominal pain, constipation, diarrhea, heartburn, nausea and vomiting.  Genitourinary:  Negative for dysuria, frequency and urgency.  Musculoskeletal:  Negative for back pain, myalgias and neck pain.  Skin:  Negative for itching and rash.  Neurological:  Negative for dizziness, tingling, seizures and headaches.  Endo/Heme/Allergies:  Negative for polydipsia.  Psychiatric/Behavioral:  Negative for depression. The patient is not nervous/anxious.     Objective:  Vitals: BP 122/80 (BP Location: Left Arm, Patient Position: Sitting, Cuff Size: Normal)   Pulse 68   Temp 98.1 F (36.7 C) (Temporal)   Resp 12   Ht 5\' 3"  (1.6 m)   Wt 156 lb 12.8 oz (71.1 kg)   SpO2 98%   BMI 27.78 kg/m  Physical Exam Vitals and nursing note reviewed.  Constitutional:      General: She is not in acute distress.    Appearance: Normal appearance. She is not ill-appearing or toxic-appearing.  HENT:     Head: Normocephalic and atraumatic.     Right Ear: Hearing, tympanic membrane, ear canal and external ear normal.     Left Ear: Hearing, tympanic membrane, ear canal and external ear normal.     Mouth/Throat:     Pharynx: Oropharynx is clear.  Eyes:     Extraocular Movements: Extraocular movements intact.     Pupils: Pupils are equal, round, and reactive to light.  Neck:     Thyroid : No thyroid  mass, thyromegaly or thyroid  tenderness.     Vascular: No carotid bruit.  Cardiovascular:     Rate and Rhythm: Normal rate and regular rhythm. No extrasystoles are present.    Pulses:          Dorsalis pedis pulses are 2+ on the right side and 2+ on the left side.     Heart sounds: Normal heart sounds. No murmur heard.    No friction rub. No gallop.  Pulmonary:     Effort: Pulmonary effort is normal.     Breath sounds: Normal breath sounds. No decreased breath sounds, wheezing, rhonchi or rales.  Chest:     Chest wall: No mass.  Abdominal:     Palpations: Abdomen is soft.  There is no hepatomegaly, splenomegaly or mass.     Tenderness: There is no abdominal tenderness.     Hernia: No hernia is present.  Musculoskeletal:     Cervical back: Normal range of motion and neck supple.     Right lower leg: No edema.     Left lower leg: No edema.  Lymphadenopathy:     Cervical: No cervical adenopathy.     Upper Body:     Right upper body: No supraclavicular adenopathy.     Left upper body: No supraclavicular adenopathy.  Skin:    General: Skin is warm and dry.  Neurological:     General: No focal deficit present.     Mental Status: She is alert and oriented to person, place, and time. Mental status is at baseline.     Sensory: Sensation is intact.     Motor: Motor function is intact. No weakness.  Deep Tendon Reflexes: Reflexes are normal and symmetric.  Psychiatric:        Attention and Perception: Attention normal.        Mood and Affect: Mood normal.        Speech: Speech normal.        Behavior: Behavior normal.        Thought Content: Thought content normal.        Cognition and Memory: Cognition normal.        Judgment: Judgment normal.   Most Recent Functional Status Assessment:    01/28/2024   11:03 AM  In your present state of health, do you have any difficulty performing the following activities:  Hearing? 0  Vision? 1  Comment go opthalmologist  Difficulty concentrating or making decisions? 1  Comment sometime difficulty with remembering things  Walking or climbing stairs? 0  Dressing or bathing? 0  Doing errands, shopping? 0  Preparing Food and eating ? N  Using the Toilet? N  In the past six months, have you accidently leaked urine? N  Do you have problems with loss of bowel control? N  Managing your Medications? N  Managing your Finances? N  Housekeeping or managing your Housekeeping? N   Most Recent Fall Risk Assessment:    01/28/2024   11:12 AM  Fall Risk   Falls in the past year? 1  Injury with Fall? 1  Comment hematoma  left arm  Risk for fall due to : No Fall Risks   Most Recent Depression Screenings:    01/28/2024   11:13 AM 01/28/2024   11:10 AM  PHQ 2/9 Scores  PHQ - 2 Score 0 0   Most Recent Cognitive Screening:    01/28/2024   11:13 AM  6CIT Screen  What Year? 0 points  What month? 0 points  What time? 0 points  Count back from 20 0 points  Months in reverse 0 points  Repeat phrase 0 points  Total Score 0 points   Results:  Studies Obtained And Personally Reviewed By Me:  Mammogram 11/29/2023 normal with repeat recommendation of 2026.  Colonoscopy 02/23/2022 found Diverticulosis in sigmoid colon; exam otherwise normal. Repeat only as needed per Dr. Willy Harvest.   Labs:     Component Value Date/Time   NA 142 01/24/2024 0918   NA 142 05/31/2017 0907   K 4.9 01/24/2024 0918   CL 105 01/24/2024 0918   CO2 29 01/24/2024 0918   GLUCOSE 93 01/24/2024 0918   BUN 17 01/24/2024 0918   BUN 21 05/31/2017 0907   CREATININE 0.95 01/24/2024 0918   CALCIUM  9.7 01/24/2024 0918   PROT 7.1 01/24/2024 0918   ALBUMIN 4.2 10/28/2023 2224   AST 22 01/24/2024 0918   ALT 16 01/24/2024 0918   ALKPHOS 56 10/28/2023 2224   BILITOT 0.3 01/24/2024 0918   GFRNONAA >60 10/28/2023 2224   GFRNONAA 79 12/05/2020 1122   GFRAA 92 12/05/2020 1122    Lab Results  Component Value Date   WBC 5.6 01/24/2024   HGB 13.4 01/24/2024   HCT 40.4 01/24/2024   MCV 91.0 01/24/2024   PLT 173 01/24/2024   Lab Results  Component Value Date   CHOL 173 01/24/2024   HDL 84 01/24/2024   LDLCALC 75 01/24/2024   TRIG 49 01/24/2024   CHOLHDL 2.1 01/24/2024   Lab Results  Component Value Date   TSH 1.92 01/24/2024    Results for orders placed or performed in visit on 01/28/24  POCT  urinalysis dipstick  Result Value Ref Range   Color, UA yellow yellow   Clarity, UA cloudy (A) clear   Glucose, UA negative negative mg/dL   Bilirubin, UA negative negative   Ketones, POC UA negative negative mg/dL   Spec Grav, UA 1.610 9.604  - 1.025   Blood, UA small (A) negative   pH, UA 6.0 5.0 - 8.0   Protein Ur, POC negative negative mg/dL   Urobilinogen, UA negative (A) 0.2 or 1.0 E.U./dL   Nitrite, UA Negative Negative   Leukocytes, UA Negative Negative   Assessment & Plan:   Orders Placed This Encounter  Procedures   Urine Culture   POCT urinalysis dipstick  Other Labs Reviewed today: CBC: WNL CMP: WNL   Hematuria: UA today with urine cloudiness with small amounts of blood noted - sending for culture.   Pure Cholesterolemia treated with Rosuvastatin  20 mg daily. 2023 Coronary Calcium  Score of 0. 01/24/2024 Lipid Panel: WNL. Followed by Dr. Luana Rumple, Cardiologist.  Anxiety/Depression treated with Xanax  0.5 mg as needed and Fluvoxamine  Maleate 150 mg nightly. Followed by Linde Reveal w/ [facility].  Hypothyroidism treated with Levothyroxine  75 mcg. 01/24/2024 TSH: 1.92.  Rheumatoid Arthritis treated with Otezla  30 mg twice daily. Psoriasis treated with Actemra . She says that she feels like she doesn't even have Arthritis most days, and if her joints aren't bothering her she will take her Actemra  every other week instead of weekly. Followed by [facility] in [place].    Mammogram 11/29/2023 normal with repeat recommendation of 2026.  Colonoscopy 02/23/2022 found Diverticulosis in sigmoid colon; exam otherwise normal. Repeat only as needed per Dr. Willy Harvest. History of IBS with infrequent episodes of cramps and stool urgency according to Dr. Jadene Maxwell note on 07/14/2023.   Bone Density due.   Vaccine Counseling: UTD on Flu, PNA, and Tdap.   Annual wellness visit done today including the all of the following: Reviewed patient's Family Medical History Reviewed and updated list of patient's medical providers Assessment of cognitive impairment was done Assessed patient's functional ability Established a written schedule for health screening services Health Risk Assessent Completed and  Reviewed  Discussed health benefits of physical activity, and encouraged her to engage in regular exercise appropriate for her age and condition.    I,Emily Lagle,acting as a Neurosurgeon for Sylvan Evener, MD.,have documented all relevant documentation on the behalf of Sylvan Evener, MD,as directed by  Sylvan Evener, MD while in the presence of Sylvan Evener, MD.   I, Sylvan Evener, MD, have reviewed all documentation for this visit. The documentation on 02/20/24 for the exam, diagnosis, procedures, and orders are all accurate and complete.

## 2024-01-19 ENCOUNTER — Other Ambulatory Visit (HOSPITAL_BASED_OUTPATIENT_CLINIC_OR_DEPARTMENT_OTHER): Payer: Self-pay

## 2024-01-20 ENCOUNTER — Other Ambulatory Visit (HOSPITAL_BASED_OUTPATIENT_CLINIC_OR_DEPARTMENT_OTHER): Payer: Self-pay

## 2024-01-20 ENCOUNTER — Other Ambulatory Visit: Payer: Self-pay

## 2024-01-24 ENCOUNTER — Other Ambulatory Visit: Payer: Medicare PPO

## 2024-01-24 ENCOUNTER — Other Ambulatory Visit (HOSPITAL_BASED_OUTPATIENT_CLINIC_OR_DEPARTMENT_OTHER): Payer: Self-pay

## 2024-01-24 ENCOUNTER — Other Ambulatory Visit: Payer: Self-pay

## 2024-01-24 DIAGNOSIS — Z0000 Encounter for general adult medical examination without abnormal findings: Secondary | ICD-10-CM

## 2024-01-24 DIAGNOSIS — E039 Hypothyroidism, unspecified: Secondary | ICD-10-CM

## 2024-01-24 DIAGNOSIS — E7800 Pure hypercholesterolemia, unspecified: Secondary | ICD-10-CM | POA: Diagnosis not present

## 2024-01-25 ENCOUNTER — Other Ambulatory Visit (HOSPITAL_BASED_OUTPATIENT_CLINIC_OR_DEPARTMENT_OTHER): Payer: Self-pay

## 2024-01-25 LAB — COMPLETE METABOLIC PANEL WITHOUT GFR
AG Ratio: 2.1 (calc) (ref 1.0–2.5)
ALT: 16 U/L (ref 6–29)
AST: 22 U/L (ref 10–35)
Albumin: 4.8 g/dL (ref 3.6–5.1)
Alkaline phosphatase (APISO): 59 U/L (ref 37–153)
BUN: 17 mg/dL (ref 7–25)
CO2: 29 mmol/L (ref 20–32)
Calcium: 9.7 mg/dL (ref 8.6–10.4)
Chloride: 105 mmol/L (ref 98–110)
Creat: 0.95 mg/dL (ref 0.60–1.00)
Globulin: 2.3 g/dL (ref 1.9–3.7)
Glucose, Bld: 93 mg/dL (ref 65–99)
Potassium: 4.9 mmol/L (ref 3.5–5.3)
Sodium: 142 mmol/L (ref 135–146)
Total Bilirubin: 0.3 mg/dL (ref 0.2–1.2)
Total Protein: 7.1 g/dL (ref 6.1–8.1)

## 2024-01-25 LAB — CBC WITH DIFFERENTIAL/PLATELET
Absolute Lymphocytes: 1630 {cells}/uL (ref 850–3900)
Absolute Monocytes: 403 {cells}/uL (ref 200–950)
Basophils Absolute: 28 {cells}/uL (ref 0–200)
Basophils Relative: 0.5 %
Eosinophils Absolute: 431 {cells}/uL (ref 15–500)
Eosinophils Relative: 7.7 %
HCT: 40.4 % (ref 35.0–45.0)
Hemoglobin: 13.4 g/dL (ref 11.7–15.5)
MCH: 30.2 pg (ref 27.0–33.0)
MCHC: 33.2 g/dL (ref 32.0–36.0)
MCV: 91 fL (ref 80.0–100.0)
MPV: 11.9 fL (ref 7.5–12.5)
Monocytes Relative: 7.2 %
Neutro Abs: 3108 {cells}/uL (ref 1500–7800)
Neutrophils Relative %: 55.5 %
Platelets: 173 10*3/uL (ref 140–400)
RBC: 4.44 10*6/uL (ref 3.80–5.10)
RDW: 11.9 % (ref 11.0–15.0)
Total Lymphocyte: 29.1 %
WBC: 5.6 10*3/uL (ref 3.8–10.8)

## 2024-01-25 LAB — LIPID PANEL
Cholesterol: 173 mg/dL (ref <200)
HDL: 84 mg/dL (ref >=50)
LDL Cholesterol (Calc): 75 mg/dL
Non-HDL Cholesterol (Calc): 89 mg/dL (ref <130)
Total CHOL/HDL Ratio: 2.1 (calc) (ref <5.0)
Triglycerides: 49 mg/dL (ref <150)

## 2024-01-25 LAB — TSH: TSH: 1.92 m[IU]/L (ref 0.40–4.50)

## 2024-01-28 ENCOUNTER — Encounter: Payer: Self-pay | Admitting: Internal Medicine

## 2024-01-28 ENCOUNTER — Ambulatory Visit: Payer: Medicare PPO | Admitting: Internal Medicine

## 2024-01-28 VITALS — BP 122/80 | HR 68 | Temp 98.1°F | Resp 12 | Ht 63.0 in | Wt 156.8 lb

## 2024-01-28 DIAGNOSIS — F419 Anxiety disorder, unspecified: Secondary | ICD-10-CM | POA: Diagnosis not present

## 2024-01-28 DIAGNOSIS — E039 Hypothyroidism, unspecified: Secondary | ICD-10-CM

## 2024-01-28 DIAGNOSIS — M05741 Rheumatoid arthritis with rheumatoid factor of right hand without organ or systems involvement: Secondary | ICD-10-CM

## 2024-01-28 DIAGNOSIS — F32A Depression, unspecified: Secondary | ICD-10-CM | POA: Diagnosis not present

## 2024-01-28 DIAGNOSIS — Z0000 Encounter for general adult medical examination without abnormal findings: Secondary | ICD-10-CM | POA: Diagnosis not present

## 2024-01-28 DIAGNOSIS — R319 Hematuria, unspecified: Secondary | ICD-10-CM | POA: Diagnosis not present

## 2024-01-28 DIAGNOSIS — M05742 Rheumatoid arthritis with rheumatoid factor of left hand without organ or systems involvement: Secondary | ICD-10-CM | POA: Diagnosis not present

## 2024-01-28 DIAGNOSIS — E7800 Pure hypercholesterolemia, unspecified: Secondary | ICD-10-CM | POA: Diagnosis not present

## 2024-01-28 LAB — POCT URINALYSIS DIP (MANUAL ENTRY)
Bilirubin, UA: NEGATIVE
Glucose, UA: NEGATIVE mg/dL
Ketones, POC UA: NEGATIVE mg/dL
Leukocytes, UA: NEGATIVE
Nitrite, UA: NEGATIVE
Protein Ur, POC: NEGATIVE mg/dL
Spec Grav, UA: 1.02 (ref 1.010–1.025)
Urobilinogen, UA: NEGATIVE U/dL — AB
pH, UA: 6 (ref 5.0–8.0)

## 2024-01-28 NOTE — Patient Instructions (Addendum)
 Urine sent for culture today as dipstick is abnormal. RTC in one year for Medicare wellness visit or as needed. No change in medications.Labs are stable.

## 2024-01-29 LAB — URINE CULTURE
MICRO NUMBER:: 16290425
Result:: NO GROWTH
SPECIMEN QUALITY:: ADEQUATE

## 2024-01-31 ENCOUNTER — Encounter: Payer: Self-pay | Admitting: Internal Medicine

## 2024-02-02 ENCOUNTER — Other Ambulatory Visit (HOSPITAL_BASED_OUTPATIENT_CLINIC_OR_DEPARTMENT_OTHER): Payer: Self-pay

## 2024-02-03 ENCOUNTER — Other Ambulatory Visit (HOSPITAL_COMMUNITY): Payer: Self-pay

## 2024-02-07 DIAGNOSIS — F411 Generalized anxiety disorder: Secondary | ICD-10-CM | POA: Diagnosis not present

## 2024-02-18 ENCOUNTER — Telehealth: Payer: Self-pay | Admitting: *Deleted

## 2024-02-18 ENCOUNTER — Other Ambulatory Visit: Payer: Self-pay

## 2024-02-18 ENCOUNTER — Other Ambulatory Visit (HOSPITAL_BASED_OUTPATIENT_CLINIC_OR_DEPARTMENT_OTHER): Payer: Self-pay

## 2024-02-18 MED ORDER — FLUVOXAMINE MALEATE ER 100 MG PO CP24
100.0000 mg | ORAL_CAPSULE | Freq: Every day | ORAL | 1 refills | Status: DC
  Filled 2024-02-18: qty 30, 30d supply, fill #0
  Filled 2024-03-17: qty 30, 30d supply, fill #1

## 2024-02-18 NOTE — Telephone Encounter (Signed)
 Copied from CRM [id number]. Topic: Clinical - Medical Advice >> Feb 07, 2024  2:57 PM Opal Bill wrote: Reason for CRM: Patient was just in to see the doctor and didn't discuss her neck injury. She was taking a heavy mirror down and and raised it over her head and possibly pulled a muscle. She thought it would get better but it hasn't. She is not sure if she needs a referral or not and would like Dr. Liane Redman to call her to further discuss and advise. >> Feb 10, 2024  2:53 PM Fredrica W wrote: Patient called back to check status. Let her know provider out of office until 4/24. Patient understood.

## 2024-02-21 NOTE — Telephone Encounter (Signed)
Called patient, left voicemail to call office back.

## 2024-02-22 ENCOUNTER — Other Ambulatory Visit: Payer: Self-pay

## 2024-02-22 ENCOUNTER — Other Ambulatory Visit (HOSPITAL_BASED_OUTPATIENT_CLINIC_OR_DEPARTMENT_OTHER): Payer: Self-pay

## 2024-02-23 ENCOUNTER — Other Ambulatory Visit: Payer: Self-pay

## 2024-02-23 NOTE — Progress Notes (Signed)
 Patient Care Team: Sylvan Evener, MD as PCP - General (Internal Medicine) Dorothe Gaster, RD as Dietitian (Family Medicine)  Visit Date: 02/28/24  Subjective:   Chief Complaint  Patient presents with   Neck Pain  Patient WU:JWJX W Morrissey,Female DOB:02-23-1951,73 y.o. BJY:[id number]   73 y.o. Female presents today for acute visit with Neck Pain. Patient has a past medical history of RA. According to telephone communication on 4/25 her neck pain began after she had lifted a heavy mirror over her head, and she thought it would improve, but it so far hasn't. In-office today says that this incident occurred back in January, and pain has only been in the left-side of neck; has been using ice/heat and back massages to alleviate the pain w/o much relief. She says that this has been affecting sleep and some of her daily activities. Denies numbness/tingling in hands or radiating pain. Past Medical History:  Diagnosis Date   Anxiety    Hyperlipidemia    Melanoma in situ of left upper extremity (HCC) 04/2023   Pleurisy with effusion    Rheumatoid arthritis(714.0)    Thyroid  disease    hypothyroidism    Allergies  Allergen Reactions   Amoxicillin  Other (See Comments)    Pt gets yeast infection Has patient had a PCN reaction causing immediate rash, facial/tongue/throat swelling, SOB or lightheadedness with hypotension: No Has patient had a PCN reaction causing severe rash involving mucus membranes or skin necrosis: No Has patient had a PCN reaction that required hospitalization: No Has patient had a PCN reaction occurring within the last 10 years: No If all of the above answers are "NO", then may proceed with Cephalosporin use.     Family History  Problem Relation Age of Onset   Emphysema Mother    Rheum arthritis Mother    Hypertension Father    Stroke Father    Diabetes Paternal Uncle    Colon cancer Neg Hx    Rectal cancer Neg Hx    Stomach cancer Neg Hx    Esophageal cancer Neg Hx     Cancer Neg Hx    Social History   Social History Narrative   Former smoker, quit 2014. Never used smokeless tobacco or illicit drugs. Does consume alcohol socially - EtOH 1-2 drinks/week. 3 cups of caffeine/day. Married. Has 2 adult sons. 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    2025 - She has been helping take care of her neighbor, who is 48 y.o.      Fhx:   No Fhx of Premature Cardiac Illness, Sudden Death, GI Cancers (Esophageal, Stomach, Colon, or Rectal).    Mother deceased due to Respiratory Complications w/ hx of Emphysema, Rheumatoid Arthritis, Bradycardia and a Pacemaker.   Father deceased due to Complications of Sepsis w/ hx of Stroke in his 2s and Hypertension,   Paternal Uncle w/ hx of Diabetes.   2 Paternal Aunts underwent Endarterectomy in their 57s.    Sister deceased of Breast Cancer.   1 Sister living  Dietra Fraction is a sophomore in college Review of Systems  Musculoskeletal:  Positive for neck pain (left-side).       (-) Radiating pain  Neurological:  Negative for tingling (/numbness).     Objective:  Vitals:  Today's Vitals   02/28/24 1430  BP: 130/80  Pulse: 68  SpO2: 97%  Weight: 156 lb (70.8 kg)  Height: 5\' 3"  (1.6 m)  PainSc: 7   PainLoc: Neck  Physical Exam  Vitals and nursing note reviewed.  Constitutional:      General: She is not in acute distress.    Appearance: Normal appearance. She is not toxic-appearing.  HENT:     Head: Normocephalic and atraumatic.  Neck:     Comments: sternocleidomastoid muscle tenderness - palpable spasm  Pulmonary:     Effort: Pulmonary effort is normal.  Musculoskeletal:     Cervical back: Muscular tenderness present.  Skin:    General: Skin is warm and dry.  Neurological:     Mental Status: She is alert and oriented to person, place, and time. Mental status is at baseline.     Motor: Motor function is intact. No weakness.  Psychiatric:        Mood and Affect: Mood normal.         Behavior: Behavior normal.        Thought Content: Thought content normal.        Judgment: Judgment normal.     Results:  Studies Obtained And Personally Reviewed By Me: Labs:     Component Value Date/Time   NA 142 01/24/2024 0918   NA 142 05/31/2017 0907   K 4.9 01/24/2024 0918   CL 105 01/24/2024 0918   CO2 29 01/24/2024 0918   GLUCOSE 93 01/24/2024 0918   BUN 17 01/24/2024 0918   BUN 21 05/31/2017 0907   CREATININE 0.95 01/24/2024 0918   CALCIUM  9.7 01/24/2024 0918   PROT 7.1 01/24/2024 0918   ALBUMIN 4.2 10/28/2023 2224   AST 22 01/24/2024 0918   ALT 16 01/24/2024 0918   ALKPHOS 56 10/28/2023 2224   BILITOT 0.3 01/24/2024 0918   GFRNONAA >60 10/28/2023 2224   GFRNONAA 79 12/05/2020 1122   GFRAA 92 12/05/2020 1122    Lab Results  Component Value Date   WBC 5.6 01/24/2024   HGB 13.4 01/24/2024   HCT 40.4 01/24/2024   MCV 91.0 01/24/2024   PLT 173 01/24/2024   Lab Results  Component Value Date   CHOL 173 01/24/2024   HDL 84 01/24/2024   LDLCALC 75 01/24/2024   TRIG 49 01/24/2024   CHOLHDL 2.1 01/24/2024   Lab Results  Component Value Date   TSH 1.92 01/24/2024    Assessment & Plan:  Neck Pain has only been in the left-side of neck, began after she had lifted a heavy mirror over her head back in January, which she thought would improve. Has been using ice/heat and back massages to alleviate the pain w/o much relief. This has been affecting sleep and some of her daily activities. Denies numbness/tingling in hands or radiating pain. She questioned about acupuncture vs physical therapy, as she would prefer to try acupuncture first - informed her that she can try acupuncture and if this does not relieve her pain can defer to PT. Will send in Flexeril  10 mg to take half to one whole tablet daily at bedtime as needed.      I,Emily Lagle,acting as a Neurosurgeon for Sylvan Evener, MD.,have documented all relevant documentation on the behalf of Sylvan Evener, MD,as  directed by  Sylvan Evener, MD while in the presence of Sylvan Evener, MD.   I, Sylvan Evener, MD, have reviewed all documentation for this visit. The documentation on 03/06/24 for the exam, diagnosis, procedures, and orders are all accurate and complete.

## 2024-02-28 ENCOUNTER — Other Ambulatory Visit (HOSPITAL_BASED_OUTPATIENT_CLINIC_OR_DEPARTMENT_OTHER): Payer: Self-pay

## 2024-02-28 ENCOUNTER — Ambulatory Visit: Admitting: Internal Medicine

## 2024-02-28 VITALS — BP 130/80 | HR 68 | Ht 63.0 in | Wt 156.0 lb

## 2024-02-28 DIAGNOSIS — M542 Cervicalgia: Secondary | ICD-10-CM

## 2024-02-28 DIAGNOSIS — T148XXA Other injury of unspecified body region, initial encounter: Secondary | ICD-10-CM

## 2024-02-28 MED ORDER — CYCLOBENZAPRINE HCL 10 MG PO TABS
5.0000 mg | ORAL_TABLET | Freq: Every evening | ORAL | 1 refills | Status: DC | PRN
  Filled 2024-02-28: qty 30, 30d supply, fill #0

## 2024-03-02 ENCOUNTER — Other Ambulatory Visit: Payer: Self-pay

## 2024-03-02 ENCOUNTER — Other Ambulatory Visit: Payer: Self-pay | Admitting: Internal Medicine

## 2024-03-02 ENCOUNTER — Other Ambulatory Visit (HOSPITAL_BASED_OUTPATIENT_CLINIC_OR_DEPARTMENT_OTHER): Payer: Self-pay

## 2024-03-02 DIAGNOSIS — Z5181 Encounter for therapeutic drug level monitoring: Secondary | ICD-10-CM | POA: Diagnosis not present

## 2024-03-02 DIAGNOSIS — F4312 Post-traumatic stress disorder, chronic: Secondary | ICD-10-CM | POA: Diagnosis not present

## 2024-03-02 DIAGNOSIS — F411 Generalized anxiety disorder: Secondary | ICD-10-CM | POA: Diagnosis not present

## 2024-03-02 DIAGNOSIS — F4311 Post-traumatic stress disorder, acute: Secondary | ICD-10-CM | POA: Diagnosis not present

## 2024-03-02 MED ORDER — ALPRAZOLAM 0.5 MG PO TABS
0.5000 mg | ORAL_TABLET | Freq: Every morning | ORAL | 1 refills | Status: AC
Start: 2024-03-02 — End: ?
  Filled 2024-03-02: qty 30, 30d supply, fill #0

## 2024-03-02 MED ORDER — FLUVOXAMINE MALEATE ER 100 MG PO CP24
100.0000 mg | ORAL_CAPSULE | Freq: Every day | ORAL | 2 refills | Status: DC
Start: 2024-03-02 — End: 2024-08-04
  Filled 2024-03-02 – 2024-04-17 (×3): qty 30, 30d supply, fill #0
  Filled 2024-05-16: qty 30, 30d supply, fill #1
  Filled 2024-06-13: qty 30, 30d supply, fill #2

## 2024-03-02 MED ORDER — LEVOTHYROXINE SODIUM 75 MCG PO TABS
75.0000 ug | ORAL_TABLET | Freq: Every day | ORAL | 1 refills | Status: DC
  Filled 2024-03-02: qty 90, 90d supply, fill #0
  Filled 2024-05-31: qty 90, 90d supply, fill #1

## 2024-03-02 MED ORDER — BUSPIRONE HCL 10 MG PO TABS
ORAL_TABLET | ORAL | 2 refills | Status: AC
  Filled 2024-03-02: qty 60, 14d supply, fill #0
  Filled 2024-03-10 – 2024-03-13 (×2): qty 60, 14d supply, fill #1
  Filled 2024-03-24: qty 60, 30d supply, fill #2

## 2024-03-03 ENCOUNTER — Other Ambulatory Visit (HOSPITAL_BASED_OUTPATIENT_CLINIC_OR_DEPARTMENT_OTHER): Payer: Self-pay

## 2024-03-06 ENCOUNTER — Encounter: Payer: Self-pay | Admitting: Internal Medicine

## 2024-03-06 NOTE — Patient Instructions (Signed)
 You have been diagnosed with muscle strain of neck from heavy lifting. Have prescribed Flexeril . May take this 1 to 3 times daily. It can cause drowsiness. May use ice or hear 20 minutes at a time to help spasm. Can order PT if not improving.

## 2024-03-10 ENCOUNTER — Other Ambulatory Visit (HOSPITAL_BASED_OUTPATIENT_CLINIC_OR_DEPARTMENT_OTHER): Payer: Self-pay

## 2024-03-13 ENCOUNTER — Other Ambulatory Visit (HOSPITAL_BASED_OUTPATIENT_CLINIC_OR_DEPARTMENT_OTHER): Payer: Self-pay

## 2024-03-13 ENCOUNTER — Other Ambulatory Visit: Payer: Self-pay

## 2024-03-22 ENCOUNTER — Other Ambulatory Visit (HOSPITAL_BASED_OUTPATIENT_CLINIC_OR_DEPARTMENT_OTHER): Payer: Self-pay

## 2024-03-22 DIAGNOSIS — F411 Generalized anxiety disorder: Secondary | ICD-10-CM | POA: Diagnosis not present

## 2024-03-24 ENCOUNTER — Other Ambulatory Visit (HOSPITAL_BASED_OUTPATIENT_CLINIC_OR_DEPARTMENT_OTHER): Payer: Self-pay

## 2024-03-27 ENCOUNTER — Other Ambulatory Visit: Payer: Self-pay

## 2024-03-27 ENCOUNTER — Other Ambulatory Visit (HOSPITAL_BASED_OUTPATIENT_CLINIC_OR_DEPARTMENT_OTHER): Payer: Self-pay

## 2024-03-28 ENCOUNTER — Other Ambulatory Visit (HOSPITAL_BASED_OUTPATIENT_CLINIC_OR_DEPARTMENT_OTHER): Payer: Self-pay

## 2024-04-03 ENCOUNTER — Ambulatory Visit: Admitting: Internal Medicine

## 2024-04-03 ENCOUNTER — Encounter: Payer: Self-pay | Admitting: Internal Medicine

## 2024-04-03 VITALS — BP 110/80 | Ht 63.0 in

## 2024-04-03 DIAGNOSIS — M542 Cervicalgia: Secondary | ICD-10-CM

## 2024-04-03 DIAGNOSIS — T148XXA Other injury of unspecified body region, initial encounter: Secondary | ICD-10-CM

## 2024-04-03 DIAGNOSIS — M05741 Rheumatoid arthritis with rheumatoid factor of right hand without organ or systems involvement: Secondary | ICD-10-CM

## 2024-04-03 NOTE — Progress Notes (Signed)
 Patient Care Team: Sylvan Evener, MD as PCP - General (Internal Medicine) Dorothe Gaster, RD as Dietitian (Family Medicine)  Visit Date: 04/03/24  Subjective:  Patient Pamela Ingram,Female DOB:12-27-1950,72 y.o. BJY:[id number]  Chief Complaint  Patient presents with   Neck Pain  72 y.o.Female presents today for Left-sided Neck Pain. Patient has a past medical history of Rheumatoid Arthritis. Seen for this on 02/28/2024, and to reiterate, back in January she lifted a heavy mirror over her head and afterwards is when neck pain onset. She had tried ice/heat and massages to alleviate the pain w/o relief, and today says that the Flexeril  10 mg, Ibuprofen , and even Tylenol  Arthritis did not provide relief. However, she says that acupuncture did temporarily relieve her pain. Denies tingling/numbness, weakness, or radiating pain.   Past Medical History:  Diagnosis Date   Anxiety    Hyperlipidemia    Melanoma in situ of left upper extremity (HCC) 04/2023   Pleurisy with effusion    Rheumatoid arthritis(714.0)    Thyroid  disease    hypothyroidism   Allergies  Allergen Reactions   Amoxicillin  Other (See Comments)    Pt gets yeast infection Has patient had a PCN reaction causing immediate rash, facial/tongue/throat swelling, SOB or lightheadedness with hypotension: No Has patient had a PCN reaction causing severe rash involving mucus membranes or skin necrosis: No Has patient had a PCN reaction that required hospitalization: No Has patient had a PCN reaction occurring within the last 10 years: No If all of the above answers are "NO", then may proceed with Cephalosporin use.    Family History  Problem Relation Age of Onset   Emphysema Mother    Rheum arthritis Mother    Hypertension Father    Stroke Father    Diabetes Paternal Uncle    Colon cancer Neg Hx    Rectal cancer Neg Hx    Stomach cancer Neg Hx    Esophageal cancer Neg Hx    Cancer Neg Hx    Social History   Social  History Narrative   Former smoker, quit 2014. Never used smokeless tobacco or illicit drugs. Does consume alcohol socially - EtOH 1-2 drinks/week. 3 cups of caffeine/day. Married. Has 2 adult sons. 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    2025 - She has been helping take care of her neighbor, who is 94 y.o.      Fhx:   No Fhx of Premature Cardiac Illness, Sudden Death, GI Cancers (Esophageal, Stomach, Colon, or Rectal).    Mother deceased due to Respiratory Complications w/ hx of Emphysema, Rheumatoid Arthritis, Bradycardia and a Pacemaker.   Father deceased due to Complications of Sepsis w/ hx of Stroke in his 16s and Hypertension,   Paternal Uncle w/ hx of Diabetes.   2 Paternal Aunts underwent Endarterectomy in their 28s.    Sister deceased of Breast Cancer.   1 Sister living  Review of Systems  Musculoskeletal:  Positive for neck pain (left-sided).  All other systems reviewed and are negative.  Objective:  Vitals:  Today's Vitals   04/03/24 1550  BP: 110/80  Height: 5\' 3"  (1.6 m)  PainSc: 4   PainLoc: Neck  Physical Exam Vitals and nursing note reviewed.  Constitutional:      General: She is not in acute distress.    Appearance: Normal appearance. She is not toxic-appearing.  HENT:     Head: Normocephalic and atraumatic.  Pulmonary:     Effort: Pulmonary effort  is normal.  Musculoskeletal:     Comments: Palpable spasm in left sternocleidomastoid muscle  Skin:    General: Skin is warm and dry.  Neurological:     Mental Status: She is alert and oriented to person, place, and time. Mental status is at baseline.  Psychiatric:        Mood and Affect: Mood normal.        Behavior: Behavior normal.        Thought Content: Thought content normal.        Judgment: Judgment normal.     Results:  Studies Obtained And Personally Reviewed By Me: Labs:     Component Value Date/Time   NA 142 01/24/2024 0918   NA 142 05/31/2017 0907   K 4.9  01/24/2024 0918   CL 105 01/24/2024 0918   CO2 29 01/24/2024 0918   GLUCOSE 93 01/24/2024 0918   BUN 17 01/24/2024 0918   BUN 21 05/31/2017 0907   CREATININE 0.95 01/24/2024 0918   CALCIUM  9.7 01/24/2024 0918   PROT 7.1 01/24/2024 0918   ALBUMIN 4.2 10/28/2023 2224   AST 22 01/24/2024 0918   ALT 16 01/24/2024 0918   ALKPHOS 56 10/28/2023 2224   BILITOT 0.3 01/24/2024 0918   GFRNONAA >60 10/28/2023 2224   GFRNONAA 79 12/05/2020 1122   GFRAA 92 12/05/2020 1122    Lab Results  Component Value Date   WBC 5.6 01/24/2024   HGB 13.4 01/24/2024   HCT 40.4 01/24/2024   MCV 91.0 01/24/2024   PLT 173 01/24/2024   Lab Results  Component Value Date   CHOL 173 01/24/2024   HDL 84 01/24/2024   LDLCALC 75 01/24/2024   TRIG 49 01/24/2024   CHOLHDL 2.1 01/24/2024   Lab Results  Component Value Date   TSH 1.92 01/24/2024    Assessment & Plan:   Orders Placed This Encounter  Procedures   Ambulatory referral to Physical Therapy   Left-sided Neck Pain started back in January after she lifted a heavy mirror over her head. She has tried ice/heat, massages, Flexeril  10 mg daily, Ibuprofen , and even Tylenol  Arthritis w/o relief. However, acupuncture did temporarily relieve her pain apparently. Denies tingling/numbness, weakness, or radiating pain. Recommended alternating heat/ice for relief. Placing referral to PT.      I,Emily Lagle,acting as a scribe for Sylvan Evener, MD.,have documented all relevant documentation on the behalf of Sylvan Evener, MD,as directed by  Sylvan Evener, MD while in the presence of Sylvan Evener, MD.   I, Sylvan Evener, MD, have reviewed all documentation for this visit. The documentation on 04/04/24 for the exam, diagnosis, procedures, and orders are all accurate and complete.

## 2024-04-04 NOTE — Patient Instructions (Addendum)
 Suggest alternating heat and ice for neck pain. Has palpable spasm left neck. Referring to PT. Patient does not want to take course of steroids as she takes Otezla  for Rheumatoid Arthritis.

## 2024-04-10 DIAGNOSIS — Z79899 Other long term (current) drug therapy: Secondary | ICD-10-CM | POA: Diagnosis not present

## 2024-04-10 DIAGNOSIS — M0579 Rheumatoid arthritis with rheumatoid factor of multiple sites without organ or systems involvement: Secondary | ICD-10-CM | POA: Diagnosis not present

## 2024-04-17 ENCOUNTER — Other Ambulatory Visit: Payer: Self-pay

## 2024-04-17 ENCOUNTER — Other Ambulatory Visit (HOSPITAL_BASED_OUTPATIENT_CLINIC_OR_DEPARTMENT_OTHER): Payer: Self-pay

## 2024-04-24 ENCOUNTER — Other Ambulatory Visit: Payer: Self-pay

## 2024-04-24 ENCOUNTER — Ambulatory Visit: Attending: Internal Medicine | Admitting: Physical Therapy

## 2024-04-24 ENCOUNTER — Encounter: Payer: Self-pay | Admitting: Physical Therapy

## 2024-04-24 DIAGNOSIS — R293 Abnormal posture: Secondary | ICD-10-CM | POA: Diagnosis not present

## 2024-04-24 DIAGNOSIS — T148XXA Other injury of unspecified body region, initial encounter: Secondary | ICD-10-CM | POA: Insufficient documentation

## 2024-04-24 DIAGNOSIS — R252 Cramp and spasm: Secondary | ICD-10-CM | POA: Diagnosis not present

## 2024-04-24 DIAGNOSIS — R29898 Other symptoms and signs involving the musculoskeletal system: Secondary | ICD-10-CM | POA: Insufficient documentation

## 2024-04-24 DIAGNOSIS — M542 Cervicalgia: Secondary | ICD-10-CM | POA: Insufficient documentation

## 2024-04-24 NOTE — Therapy (Signed)
 OUTPATIENT PHYSICAL THERAPY CERVICAL EVALUATION   Patient Name: Pamela Ingram MRN: [id number] DOB:28-Jan-1951, 73 y.o., female Today's Date: 04/24/2024  END OF SESSION:  PT End of Session - 04/24/24 0849     Visit Number 1    Number of Visits 6    Date for PT Re-Evaluation 06/05/24    Authorization Type [employer]    Authorization Time Period auth requested 04/24/24    Progress Note Due on Visit 10    PT Start Time [phone]    PT Stop Time 0930    PT Time Calculation (min) 44 min          Past Medical History:  Diagnosis Date   Anxiety    Hyperlipidemia    Melanoma in situ of left upper extremity (HCC) 04/2023   Pleurisy with effusion    Rheumatoid arthritis(714.0)    Thyroid  disease    hypothyroidism   Past Surgical History:  Procedure Laterality Date   COLONOSCOPY     LAPAROSCOPY     LEFT HEART CATH AND CORONARY ANGIOGRAPHY N/A 06/04/2017   Procedure: Left Heart Cath and Coronary Angiography;  Surgeon: Wonda Sharper, MD;  Location: [facility] INVASIVE CV LAB;  Service: Cardiovascular;  Laterality: N/A;   MELANOMA EXCISION  04/2023   TUBAL LIGATION     VAGINAL HYSTERECTOMY  10/26/1993   Patient Active Problem List   Diagnosis Date Noted   False positive cardiac stress test 10/08/2023   Atherosclerosis of aorta (HCC) 10/08/2023   Allergic angioedema 10/07/2020   Angioedema of lips 10/07/2020   Rash 10/07/2020   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   Anxiety 05/29/2016   Esophageal reflux 05/29/2016   Osteopenia 05/29/2016   Lichen sclerosus et atrophicus 07/24/2015   Overweight (BMI 25.0-29.9) 03/13/2013   Rheumatoid arthritis (HCC) 08/03/2011   Rheumatoid arthritis (HCC) 10/26/2008   Hypercholesterolemia 10/27/2007   Hypothyroid 10/27/2003   Depression with anxiety 10/26/1993    PCP: Perri Ronal PARAS, MD  REFERRING PROVIDER: Perri Ronal PARAS, MD  REFERRING DIAG: M54.2 (ICD-10-CM) - Neck pain on left side T14.8XXA (ICD-10-CM) -  Musculoskeletal strain  THERAPY DIAG:  Cramp and spasm  Abnormal posture  Cervicalgia  Other symptoms and signs involving the musculoskeletal system  Rationale for Evaluation and Treatment: Rehabilitation  ONSET DATE: January 2025  SUBJECTIVE:                                                                                                                                                                                                         SUBJECTIVE STATEMENT: Pt  states in January she had a heavy mirror on her mantle and went to get the wreath off and pulled the mirror down causing a major L side neck spasm/pain. Pt states difficulty turning her neck after this. Did acupuncture and massage therapy. Pt states she was given flexeril  from her PCP but didn't feel anything from it. Pt reports she was unable to sleep at night. Pt started to take magnesium supplements and is feeling better. Pt likes to knit and do things looking down and has to be careful with lifting. Pt reports it is feeling a little sore this morning. Has also done cupping.  Hand dominance: Right  PERTINENT HISTORY:  From internal medicine H&P: 73 y.o.Female presents today for Left-sided Neck Pain. Patient has a past medical history of Rheumatoid Arthritis. Seen for this on 02/28/2024, and to reiterate, back in January she lifted a heavy mirror over her head and afterwards is when neck pain onset.  PAIN:  Are you having pain? Yes: NPRS scale: 2 or 3 currently Pain location: L side neck Pain description: sore Aggravating factors: lifting Relieving factors: magnesium, ice, acupuncture helped for a while  PRECAUTIONS: None  RED FLAGS: None    WEIGHT BEARING RESTRICTIONS: No  FALLS:  Has patient fallen in last 6 months? No  LIVING ENVIRONMENT: Lives with: lives with their spouse Lives in: House/apartment  OCCUPATION: Retired but enjoys Archivist  PLOF: Independent  PATIENT GOALS: Improve muscle pain  NEXT MD  VISIT: PRN  OBJECTIVE:  Note: Objective measures were completed at Evaluation unless otherwise noted.  DIAGNOSTIC FINDINGS:  Nothing new or recent  PATIENT SURVEYS:  Neck Disability Index score: 11 / 50 = 22.0 %  COGNITION: Overall cognitive status: Within functional limits for tasks assessed  SENSATION: WFL  POSTURE: rounded shoulders and forward head head slightly in L lateral flexion at rest  PALPATION: TTP longissimus capitus and splenius capitus insertion to base of skull/mastoid process, upper lateral cervical paraspinals Hypomobility with lateral side glides L to R on upper cervical spine   CERVICAL ROM:   Active ROM A/PROM (deg) eval  Flexion 35  Extension 40  Right lateral flexion 23 pulls  Left lateral flexion 36 pain  Right rotation 70  Left rotation 50 pull and pain   (Blank rows = not tested)  UPPER EXTREMITY ROM: WNL  UPPER EXTREMITY MMT:  MMT Right eval Left eval  Shoulder flexion 5 5  Shoulder extension 5 5  Shoulder abduction 5 5  Shoulder adduction    Shoulder extension    Shoulder internal rotation 5 5  Shoulder external rotation 5 5  Middle trapezius TBA TBA  Lower trapezius TBA TBA  Elbow flexion    Elbow extension    Wrist flexion    Wrist extension    Wrist ulnar deviation    Wrist radial deviation    Wrist pronation    Wrist supination    Grip strength     (Blank rows = not tested)  CERVICAL SPECIAL TESTS:  Distraction test: Negative  FUNCTIONAL TESTS:  Did not assess  TREATMENT DATE: 04/24/24 Trial of mechanical cervical traction 10# x 10 min, 60/20 hold/rest    Demonstrated HEP below  PATIENT EDUCATION:  Education details: Exam findings, POC, initial stretching Person educated: Patient Education method: Explanation, Demonstration, and Handouts Education comprehension: verbalized understanding, returned demonstration, and needs further education  HOME EXERCISE PROGRAM: Access Code: [email or web address] URL:  [email or web address] Date: 04/24/2024 Prepared by: Catalina Salasar April Marie Amonda Brillhart  Exercises - Seated Gentle  Upper Trapezius Stretch  - 1 x daily - 7 x weekly - 2 sets - 30 sec hold - Seated Levator Scapulae Stretch  - 1 x daily - 7 x weekly - 2 sets - 30 sec hold - Sub-Occipital Cervical Stretch  - 1 x daily - 7 x weekly - 2 sets - 30 sec hold  ASSESSMENT:  CLINICAL IMPRESSION: Patient is a 73 y.o. F who was seen today for physical therapy evaluation and treatment for L neck pain and spasm. Assessment is significant for reduced cervical ROM with some upper cervical hypomobility and lateral cervical paraspinal muscle trigger points and spasm. Performed trial of mechanical traction to gently stretch cervical muscles and improve cervical spine mobility. Provided pt with stretching to further improve her muscle spasm. Pt will benefit from PT to improve on these deficits for return to all normal function.   OBJECTIVE IMPAIRMENTS: decreased coordination, decreased mobility, decreased ROM, decreased strength, hypomobility, increased fascial restrictions, increased muscle spasms, improper body mechanics, postural dysfunction, and pain.   ACTIVITY LIMITATIONS: carrying, lifting, sleeping, and reach over head  PARTICIPATION LIMITATIONS: community activity and recreational activity  PERSONAL FACTORS: Past/current experiences and Time since onset of injury/illness/exacerbation are also affecting patient's functional outcome.   REHAB POTENTIAL: Good  CLINICAL DECISION MAKING: Evolving/moderate complexity  EVALUATION COMPLEXITY: Moderate   GOALS: Goals reviewed with patient? Yes  SHORT TERM GOALS: Target date: 05/15/2024   Pt will be ind with initial HEP Baseline:  Goal status: INITIAL  2.  Pt will demo L = R cervical ROM without pain Baseline:  Goal status: INITIAL   LONG TERM GOALS: Target date: 06/05/2024   Pt will be ind with management and progression of HEP Baseline:   Goal status: INITIAL  2.  Pt will report >/=90% improvement with her overall pain Baseline:  Goal status: INITIAL  3.  Pt will be able to lift and carry at least a gallon of milk without straining her neck Baseline:  Goal status: INITIAL  4.  Pt will have improved NDI to </=0/50 to demo MCID Baseline: 11/50 Goal status: INITIAL    PLAN:  PT FREQUENCY: 1x/week  PT DURATION: 6 weeks  PLANNED INTERVENTIONS: [id number]- PT Re-evaluation, 97750- Physical Performance Testing, 97110-Therapeutic exercises, 97530- Therapeutic activity, [id number]- Neuromuscular re-education, 97535- Self Care, [id number]- Manual therapy, [id number]- Electrical stimulation (unattended), 02987- Traction (mechanical), [id number]- Ionotophoresis 4mg /ml Dexamethasone, 79439 (1-2 muscles), 20561 (3+ muscles)- Dry Needling, Patient/Family education, Taping, Joint mobilization, Spinal mobilization, Cryotherapy, and Moist heat  PLAN FOR NEXT SESSION: Assess response to HEP. Traction if indicated? Manual work for c-spine hypomobility. Cervical stability exercises (consider isometrics and initiate chin tucks)   Thomasena Vandenheuvel April [place], PT, DPT 04/24/2024, 9:42 AM

## 2024-04-25 ENCOUNTER — Other Ambulatory Visit: Payer: Self-pay

## 2024-05-03 ENCOUNTER — Encounter: Payer: Self-pay | Admitting: Physical Therapy

## 2024-05-03 ENCOUNTER — Ambulatory Visit: Attending: Internal Medicine | Admitting: Physical Therapy

## 2024-05-03 DIAGNOSIS — R293 Abnormal posture: Secondary | ICD-10-CM | POA: Diagnosis not present

## 2024-05-03 DIAGNOSIS — M6281 Muscle weakness (generalized): Secondary | ICD-10-CM | POA: Insufficient documentation

## 2024-05-03 DIAGNOSIS — R252 Cramp and spasm: Secondary | ICD-10-CM | POA: Insufficient documentation

## 2024-05-03 DIAGNOSIS — R278 Other lack of coordination: Secondary | ICD-10-CM | POA: Diagnosis not present

## 2024-05-03 DIAGNOSIS — R29898 Other symptoms and signs involving the musculoskeletal system: Secondary | ICD-10-CM | POA: Insufficient documentation

## 2024-05-03 DIAGNOSIS — M542 Cervicalgia: Secondary | ICD-10-CM | POA: Diagnosis not present

## 2024-05-03 NOTE — Therapy (Signed)
 OUTPATIENT PHYSICAL THERAPY TREATMENT   Patient Name: LAQUIA ROSANO MRN: [id number] DOB:[birth year], 73 y.o., female Today's Date: 05/03/2024  END OF SESSION:  PT End of Session - 05/03/24 0848     Visit Number 2    Number of Visits 6    Date for PT Re-Evaluation 06/05/24    Authorization Type [employer]    Authorization Time Period auth requested 04/24/24    Progress Note Due on Visit 10    PT Start Time 0848    PT Stop Time 0926    PT Time Calculation (min) 38 min           Past Medical History:  Diagnosis Date   Anxiety    Hyperlipidemia    Melanoma in situ of left upper extremity (HCC) 04/2023   Pleurisy with effusion    Rheumatoid arthritis(714.0)    Thyroid  disease    hypothyroidism   Past Surgical History:  Procedure Laterality Date   COLONOSCOPY     LAPAROSCOPY     LEFT HEART CATH AND CORONARY ANGIOGRAPHY N/A 06/04/2017   Procedure: Left Heart Cath and Coronary Angiography;  Surgeon: Wonda Sharper, MD;  Location: [facility] INVASIVE CV LAB;  Service: Cardiovascular;  Laterality: N/A;   MELANOMA EXCISION  04/2023   TUBAL LIGATION     VAGINAL HYSTERECTOMY  10/26/1993   Patient Active Problem List   Diagnosis Date Noted   False positive cardiac stress test 10/08/2023   Atherosclerosis of aorta (HCC) 10/08/2023   Allergic angioedema 10/07/2020   Angioedema of lips 10/07/2020   Rash 10/07/2020   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   Anxiety 05/29/2016   Esophageal reflux 05/29/2016   Osteopenia 05/29/2016   Lichen sclerosus et atrophicus 07/24/2015   Overweight (BMI 25.0-29.9) 03/13/2013   Rheumatoid arthritis (HCC) 08/03/2011   Rheumatoid arthritis (HCC) 10/26/2008   Hypercholesterolemia 10/27/2007   Hypothyroid 10/27/2003   Depression with anxiety 10/26/1993    PCP: Perri Ronal PARAS, MD  REFERRING PROVIDER: Perri Ronal PARAS, MD  REFERRING DIAG: M54.2 (ICD-10-CM) - Neck pain on left side T14.8XXA (ICD-10-CM) - Musculoskeletal  strain  THERAPY DIAG:  Cramp and spasm  Abnormal posture  Cervicalgia  Other symptoms and signs involving the musculoskeletal system  Other lack of coordination  Muscle weakness (generalized)  Rationale for Evaluation and Treatment: Rehabilitation  ONSET DATE: January 2025  SUBJECTIVE:  SUBJECTIVE STATEMENT: I'm hoping I'm through the worst. I'm in a pretty good spot. Once in a while it hurts but pt can tolerate it. Pt reports she really enjoyed the traction. States that the stretches were a little painful.   From eval: Pt states in January she had a heavy mirror on her mantle and went to get the wreath off and pulled the mirror down causing a major L side neck spasm/pain. Pt states difficulty turning her neck after this. Did acupuncture and massage therapy. Pt states she was given flexeril  from her PCP but didn't feel anything from it. Pt reports she was unable to sleep at night. Pt started to take magnesium supplements and is feeling better. Pt likes to knit and do things looking down and has to be careful with lifting. Pt reports it is feeling a little sore this morning. Has also done cupping.  Hand dominance: Right  PERTINENT HISTORY:  From internal medicine H&P: 73 y.o.Female presents today for Left-sided Neck Pain. Patient has a past medical history of Rheumatoid Arthritis. Seen for this on 02/28/2024, and to reiterate, back in January she lifted a heavy mirror over her head and afterwards is when neck pain onset.  PAIN:  Are you having pain? Yes: NPRS scale: 0-2 currently Pain location: L side neck Pain description: sore Aggravating factors: lifting Relieving factors: magnesium, ice, acupuncture helped for a while  PRECAUTIONS: None  RED FLAGS: None    WEIGHT BEARING  RESTRICTIONS: No  FALLS:  Has patient fallen in last 6 months? No  LIVING ENVIRONMENT: Lives with: lives with their spouse Lives in: House/apartment  OCCUPATION: Retired but enjoys Archivist  PLOF: Independent  PATIENT GOALS: Improve muscle pain  NEXT MD VISIT: PRN  OBJECTIVE:  Note: Objective measures were completed at Evaluation unless otherwise noted.  DIAGNOSTIC FINDINGS:  Nothing new or recent  PATIENT SURVEYS:  Neck Disability Index score: 11 / 50 = 22.0 %  POSTURE: rounded shoulders and forward head head slightly in L lateral flexion at rest  PALPATION: TTP longissimus capitus and splenius capitus insertion to base of skull/mastoid process, upper lateral cervical paraspinals Hypomobility with lateral side glides L to R on upper cervical spine   CERVICAL ROM:   Active ROM A/PROM (deg) eval  Flexion 35  Extension 40  Right lateral flexion 23 pulls  Left lateral flexion 36 pain  Right rotation 70  Left rotation 50 pull and pain   (Blank rows = not tested)  UPPER EXTREMITY ROM: WNL  UPPER EXTREMITY MMT:  MMT Right eval Left eval  Shoulder flexion 5 5  Shoulder extension 5 5  Shoulder abduction 5 5  Shoulder adduction    Shoulder extension    Shoulder internal rotation 5 5  Shoulder external rotation 5 5  Middle trapezius TBA TBA  Lower trapezius TBA TBA  Elbow flexion    Elbow extension    Wrist flexion    Wrist extension    Wrist ulnar deviation    Wrist radial deviation    Wrist pronation    Wrist supination    Grip strength     (Blank rows = not tested)  CERVICAL SPECIAL TESTS:  Distraction test: Negative  FUNCTIONAL TESTS:  Did not assess  TREATMENT DATE:  05/03/24 Nustep L6 x 6 min UEs/LEs Mechanical cervical traction 14# x 15 min, 60/20 hold/rest   Manual therapy: STM & TPR L upper lateral cervical (close to mastoid process), gentle upper cervical grade II to III lateral glides/mobs Self  care: Reviewed stretching HEP and  discussed self traction unit for home Supine cervical retraction 10x5 Supine cervical rotation iso 10x5  04/24/24 Trial of mechanical cervical traction 10# x 10 min, 60/20 hold/rest    Demonstrated HEP below  PATIENT EDUCATION:  Education details: Exam findings, POC, initial stretching Person educated: Patient Education method: Explanation, Demonstration, and Handouts Education comprehension: verbalized understanding, returned demonstration, and needs further education  HOME EXERCISE PROGRAM: Access Code: [email or web address] URL: [email or web address] Date: 04/24/2024 Prepared by: Lania Zawistowski April Earnie Starring  Exercises - Seated Gentle Upper Trapezius Stretch  - 1 x daily - 7 x weekly - 2 sets - 30 sec hold - Seated Levator Scapulae Stretch  - 1 x daily - 7 x weekly - 2 sets - 30 sec hold - Sub-Occipital Cervical Stretch  - 1 x daily - 7 x weekly - 2 sets - 30 sec hold  ASSESSMENT:  CLINICAL IMPRESSION: Continued traction as pt had a good response. Less trigger points noted today -- most notable along her L mastoid process. Initiated cervical stabilizing exercises. Reviewed HEP to improve her stretching.   From eval: Patient is a 73 y.o. F who was seen today for physical therapy evaluation and treatment for L neck pain and spasm. Assessment is significant for reduced cervical ROM with some upper cervical hypomobility and lateral cervical paraspinal muscle trigger points and spasm. Performed trial of mechanical traction to gently stretch cervical muscles and improve cervical spine mobility. Provided pt with stretching to further improve her muscle spasm. Pt will benefit from PT to improve on these deficits for return to all normal function.   OBJECTIVE IMPAIRMENTS: decreased coordination, decreased mobility, decreased ROM, decreased strength, hypomobility, increased fascial restrictions, increased muscle spasms, improper body mechanics, postural dysfunction, and pain.     GOALS: Goals reviewed with patient? Yes  SHORT TERM GOALS: Target date: 05/15/2024   Pt will be ind with initial HEP Baseline:  Goal status: INITIAL  2.  Pt will demo L = R cervical ROM without pain Baseline:  Goal status: INITIAL   LONG TERM GOALS: Target date: 06/05/2024   Pt will be ind with management and progression of HEP Baseline:  Goal status: INITIAL  2.  Pt will report >/=90% improvement with her overall pain Baseline:  Goal status: INITIAL  3.  Pt will be able to lift and carry at least a gallon of milk without straining her neck Baseline:  Goal status: INITIAL  4.  Pt will have improved NDI to </=0/50 to demo MCID Baseline: 11/50 Goal status: INITIAL    PLAN:  PT FREQUENCY: 1x/week  PT DURATION: 6 weeks  PLANNED INTERVENTIONS: 97164- PT Re-evaluation, 97750- Physical Performance Testing, 97110-Therapeutic exercises, 97530- Therapeutic activity, [id number]- Neuromuscular re-education, 97535- Self Care, 02859- Manual therapy, [id number]- Electrical stimulation (unattended), 02987- Traction (mechanical), [id number]- Ionotophoresis 4mg /ml Dexamethasone, 79439 (1-2 muscles), 20561 (3+ muscles)- Dry Needling, Patient/Family education, Taping, Joint mobilization, Spinal mobilization, Cryotherapy, and Moist heat  PLAN FOR NEXT SESSION: Assess response to HEP. Traction if indicated? Manual work for c-spine hypomobility. Cervical stability exercises (consider isometrics and initiate chin tucks)   Emonte Dieujuste April Ma L Abed Schar, PT, DPT 05/03/2024, 8:48 AM

## 2024-05-04 DIAGNOSIS — L409 Psoriasis, unspecified: Secondary | ICD-10-CM | POA: Diagnosis not present

## 2024-05-04 DIAGNOSIS — Z85820 Personal history of malignant melanoma of skin: Secondary | ICD-10-CM | POA: Diagnosis not present

## 2024-05-04 DIAGNOSIS — D2260 Melanocytic nevi of unspecified upper limb, including shoulder: Secondary | ICD-10-CM | POA: Diagnosis not present

## 2024-05-04 DIAGNOSIS — L578 Other skin changes due to chronic exposure to nonionizing radiation: Secondary | ICD-10-CM | POA: Diagnosis not present

## 2024-05-04 DIAGNOSIS — L814 Other melanin hyperpigmentation: Secondary | ICD-10-CM | POA: Diagnosis not present

## 2024-05-04 DIAGNOSIS — Z1283 Encounter for screening for malignant neoplasm of skin: Secondary | ICD-10-CM | POA: Diagnosis not present

## 2024-05-04 DIAGNOSIS — L400 Psoriasis vulgaris: Secondary | ICD-10-CM | POA: Diagnosis not present

## 2024-05-04 DIAGNOSIS — M05719 Rheumatoid arthritis with rheumatoid factor of unspecified shoulder without organ or systems involvement: Secondary | ICD-10-CM | POA: Diagnosis not present

## 2024-05-04 DIAGNOSIS — D239 Other benign neoplasm of skin, unspecified: Secondary | ICD-10-CM | POA: Diagnosis not present

## 2024-05-11 ENCOUNTER — Other Ambulatory Visit (HOSPITAL_BASED_OUTPATIENT_CLINIC_OR_DEPARTMENT_OTHER): Payer: Self-pay

## 2024-05-11 ENCOUNTER — Other Ambulatory Visit: Payer: Self-pay

## 2024-05-11 ENCOUNTER — Ambulatory Visit

## 2024-05-11 DIAGNOSIS — R252 Cramp and spasm: Secondary | ICD-10-CM | POA: Diagnosis not present

## 2024-05-11 DIAGNOSIS — R293 Abnormal posture: Secondary | ICD-10-CM | POA: Diagnosis not present

## 2024-05-11 DIAGNOSIS — M542 Cervicalgia: Secondary | ICD-10-CM

## 2024-05-11 DIAGNOSIS — M6281 Muscle weakness (generalized): Secondary | ICD-10-CM | POA: Diagnosis not present

## 2024-05-11 DIAGNOSIS — R29898 Other symptoms and signs involving the musculoskeletal system: Secondary | ICD-10-CM | POA: Diagnosis not present

## 2024-05-11 DIAGNOSIS — R278 Other lack of coordination: Secondary | ICD-10-CM | POA: Diagnosis not present

## 2024-05-11 NOTE — Therapy (Signed)
 OUTPATIENT PHYSICAL THERAPY TREATMENT   Patient Name: Pamela Ingram MRN: [id number] DOB:1951/07/30, 73 y.o., female Today's Date: 05/11/2024  END OF SESSION:  PT End of Session - 05/11/24 0954     Visit Number 3    Number of Visits 6    Date for PT Re-Evaluation 06/05/24    Authorization Type [employer]    Authorization Time Period auth requested 04/24/24    Progress Note Due on Visit 10    PT Start Time 0932    PT Stop Time 1015    PT Time Calculation (min) 43 min            Past Medical History:  Diagnosis Date   Anxiety    Hyperlipidemia    Melanoma in situ of left upper extremity (HCC) 04/2023   Pleurisy with effusion    Rheumatoid arthritis(714.0)    Thyroid  disease    hypothyroidism   Past Surgical History:  Procedure Laterality Date   COLONOSCOPY     LAPAROSCOPY     LEFT HEART CATH AND CORONARY ANGIOGRAPHY N/A 06/04/2017   Procedure: Left Heart Cath and Coronary Angiography;  Surgeon: Wonda Sharper, MD;  Location: [facility] INVASIVE CV LAB;  Service: Cardiovascular;  Laterality: N/A;   MELANOMA EXCISION  04/2023   TUBAL LIGATION     VAGINAL HYSTERECTOMY  10/26/1993   Patient Active Problem List   Diagnosis Date Noted   False positive cardiac stress test 10/08/2023   Atherosclerosis of aorta (HCC) 10/08/2023   Allergic angioedema 10/07/2020   Angioedema of lips 10/07/2020   Rash 10/07/2020   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   Anxiety 05/29/2016   Esophageal reflux 05/29/2016   Osteopenia 05/29/2016   Lichen sclerosus et atrophicus 07/24/2015   Overweight (BMI 25.0-29.9) 03/13/2013   Rheumatoid arthritis (HCC) 08/03/2011   Rheumatoid arthritis (HCC) 10/26/2008   Hypercholesterolemia 10/27/2007   Hypothyroid 10/27/2003   Depression with anxiety 10/26/1993    PCP: Perri Ronal PARAS, MD  REFERRING PROVIDER: Perri Ronal PARAS, MD  REFERRING DIAG: M54.2 (ICD-10-CM) - Neck pain on left side T14.8XXA (ICD-10-CM) - Musculoskeletal  strain  THERAPY DIAG:  Cramp and spasm  Abnormal posture  Cervicalgia  Rationale for Evaluation and Treatment: Rehabilitation  ONSET DATE: January 2025  SUBJECTIVE:                                                                                                                                                                                                         SUBJECTIVE STATEMENT: Reports she thinks she is through the worst of  it, getting better gradually  From eval: Pt states in January she had a heavy mirror on her mantle and went to get the wreath off and pulled the mirror down causing a major L side neck spasm/pain. Pt states difficulty turning her neck after this. Did acupuncture and massage therapy. Pt states she was given flexeril  from her PCP but didn't feel anything from it. Pt reports she was unable to sleep at night. Pt started to take magnesium supplements and is feeling better. Pt likes to knit and do things looking down and has to be careful with lifting. Pt reports it is feeling a little sore this morning. Has also done cupping.  Hand dominance: Right  PERTINENT HISTORY:  From internal medicine H&P: 73 y.o.Female presents today for Left-sided Neck Pain. Patient has a past medical history of Rheumatoid Arthritis. Seen for this on 02/28/2024, and to reiterate, back in January she lifted a heavy mirror over her head and afterwards is when neck pain onset.  PAIN:  Are you having pain? Yes: NPRS scale: 0-2 currently Pain location: L side neck Pain description: sore Aggravating factors: lifting Relieving factors: magnesium, ice, acupuncture helped for a while  PRECAUTIONS: None  RED FLAGS: None    WEIGHT BEARING RESTRICTIONS: No  FALLS:  Has patient fallen in last 6 months? No  LIVING ENVIRONMENT: Lives with: lives with their spouse Lives in: House/apartment  OCCUPATION: Retired but enjoys Archivist  PLOF: Independent  PATIENT GOALS: Improve muscle  pain  NEXT MD VISIT: PRN  OBJECTIVE:  Note: Objective measures were completed at Evaluation unless otherwise noted.  DIAGNOSTIC FINDINGS:  Nothing new or recent  PATIENT SURVEYS:  Neck Disability Index score: 11 / 50 = 22.0 %  POSTURE: rounded shoulders and forward head head slightly in L lateral flexion at rest  PALPATION: TTP longissimus capitus and splenius capitus insertion to base of skull/mastoid process, upper lateral cervical paraspinals Hypomobility with lateral side glides L to R on upper cervical spine   CERVICAL ROM:   Active ROM A/PROM (deg) eval  Flexion 35  Extension 40  Right lateral flexion 23 pulls  Left lateral flexion 36 pain  Right rotation 70  Left rotation 50 pull and pain   (Blank rows = not tested)  UPPER EXTREMITY ROM: WNL  UPPER EXTREMITY MMT:  MMT Right eval Left eval  Shoulder flexion 5 5  Shoulder extension 5 5  Shoulder abduction 5 5  Shoulder adduction    Shoulder extension    Shoulder internal rotation 5 5  Shoulder external rotation 5 5  Middle trapezius TBA TBA  Lower trapezius TBA TBA  Elbow flexion    Elbow extension    Wrist flexion    Wrist extension    Wrist ulnar deviation    Wrist radial deviation    Wrist pronation    Wrist supination    Grip strength     (Blank rows = not tested)  CERVICAL SPECIAL TESTS:  Distraction test: Negative  FUNCTIONAL TESTS:  Did not assess  TREATMENT DATE:  05/11/24:  Mechanical cervical traction 15 # , 15 min Supine for gentle lateral glides upper cervical spine Supine for flex/rotation stretch to L with contract/ relax technique  Added supine yellow t band B shoulder ER Supine yellow B shoulder horizontal abd with B shoulder flexion  Education regarding positioning on sofa to reduce strain on neck, such as adding lumbar support and using pillow under arms as pt 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  05/03/24 Nustep L6 x 6 min UEs/LEs Mechanical cervical traction 14# x 15 min,  60/20 hold/rest   Manual therapy: STM & TPR L upper lateral cervical (close to mastoid process), gentle upper cervical grade II to III lateral glides/mobs Self care: Reviewed stretching HEP and discussed self traction unit for home Supine cervical retraction 10x5 Supine cervical rotation iso 10x5  04/24/24 Trial of mechanical cervical traction 10# x 10 min, 60/20 hold/rest    Demonstrated HEP below  PATIENT EDUCATION:  Education details: Exam findings, POC, initial stretching Person educated: Patient Education method: Explanation, Demonstration, and Handouts Education comprehension: verbalized understanding, returned demonstration, and needs further education  HOME EXERCISE PROGRAM: Access Code: [email or web address] URL: [email or web address] Date: 04/24/2024 Prepared by: Gellen April Marie Nonato  Exercises - Seated Gentle Upper Trapezius Stretch  - 1 x daily - 7 x weekly - 2 sets - 30 sec hold - Seated Levator Scapulae Stretch  - 1 x daily - 7 x weekly - 2 sets - 30 sec hold - Sub-Occipital Cervical Stretch  - 1 x daily - 7 x weekly - 2 sets - 30 sec hold  ASSESSMENT:  CLINICAL IMPRESSION: Continued traction today as ongoing improvement.  Noted decreased L upper cervical ROM for rotation, so added contract/relax today for manual stretching.  Initiated periscapular middle traps strengthening.   She is continuing to improve.    From eval: Patient is a 73 y.o. F who was seen today for physical therapy evaluation and treatment for L neck pain and spasm. Assessment is significant for reduced cervical ROM with some upper cervical hypomobility and lateral cervical paraspinal muscle trigger points and spasm. Performed trial of mechanical traction to gently stretch cervical muscles and improve cervical spine mobility. Provided pt with stretching to further improve her muscle spasm. Pt will benefit from PT to improve on these deficits for return to all normal function.   OBJECTIVE  IMPAIRMENTS: decreased coordination, decreased mobility, decreased ROM, decreased strength, hypomobility, increased fascial restrictions, increased muscle spasms, improper body mechanics, postural dysfunction, and pain.    GOALS: Goals reviewed with patient? Yes  SHORT TERM GOALS: Target date: 05/15/2024   Pt will be ind with initial HEP Baseline:  Goal status: INITIAL  2.  Pt will demo L = R cervical ROM without pain Baseline:  Goal status: INITIAL   LONG TERM GOALS: Target date: 06/05/2024   Pt will be ind with management and progression of HEP Baseline:  Goal status: INITIAL  2.  Pt will report >/=90% improvement with her overall pain Baseline:  Goal status: INITIAL  3.  Pt will be able to lift and carry at least a gallon of milk without straining her neck Baseline:  Goal status: INITIAL  4.  Pt will have improved NDI to </=0/50 to demo MCID Baseline: 11/50 Goal status: INITIAL    PLAN:  PT FREQUENCY: 1x/week  PT DURATION: 6 weeks  PLANNED INTERVENTIONS: 97164- PT Re-evaluation, 97750- Physical Performance Testing, 97110-Therapeutic exercises, 97530- Therapeutic activity, [id number]- Neuromuscular re-education, 97535- Self Care, 02859- Manual therapy, G0283- Electrical stimulation (unattended), 02987- Traction (mechanical), [id number]- Ionotophoresis 4mg /ml Dexamethasone, 79439 (1-2 muscles), 20561 (3+ muscles)- Dry Needling, Patient/Family education, Taping, Joint mobilization, Spinal mobilization, Cryotherapy, and Moist heat  PLAN FOR NEXT SESSION: Assess response to HEP. Traction if indicated? Manual work for c-spine hypomobility. Cervical stability exercises (consider isometrics and initiate chin tucks)   Lynsi Dooner L Shalayne Leach, PT, DPT, OCS 05/11/2024, 1:23 PM

## 2024-05-16 ENCOUNTER — Other Ambulatory Visit: Payer: Self-pay

## 2024-05-18 ENCOUNTER — Ambulatory Visit

## 2024-05-18 DIAGNOSIS — M542 Cervicalgia: Secondary | ICD-10-CM

## 2024-05-18 DIAGNOSIS — R29898 Other symptoms and signs involving the musculoskeletal system: Secondary | ICD-10-CM

## 2024-05-18 DIAGNOSIS — R293 Abnormal posture: Secondary | ICD-10-CM

## 2024-05-18 DIAGNOSIS — M6281 Muscle weakness (generalized): Secondary | ICD-10-CM

## 2024-05-18 DIAGNOSIS — R252 Cramp and spasm: Secondary | ICD-10-CM

## 2024-05-18 DIAGNOSIS — R278 Other lack of coordination: Secondary | ICD-10-CM

## 2024-05-18 NOTE — Therapy (Signed)
 OUTPATIENT PHYSICAL THERAPY TREATMENT   Patient Name: Pamela Ingram MRN: [id number] DOB:12/06/1950, 73 y.o., female Today's Date: 05/18/2024  END OF SESSION:  PT End of Session - 05/18/24 1535     Visit Number 4    Number of Visits 6    Date for PT Re-Evaluation 06/05/24    Authorization Type [employer]    Authorization Time Period auth requested 04/24/24    Progress Note Due on Visit 10    PT Start Time 1450    PT Stop Time 1534    PT Time Calculation (min) 44 min             Past Medical History:  Diagnosis Date   Anxiety    Hyperlipidemia    Melanoma in situ of left upper extremity (HCC) 04/2023   Pleurisy with effusion    Rheumatoid arthritis(714.0)    Thyroid  disease    hypothyroidism   Past Surgical History:  Procedure Laterality Date   COLONOSCOPY     LAPAROSCOPY     LEFT HEART CATH AND CORONARY ANGIOGRAPHY N/A 06/04/2017   Procedure: Left Heart Cath and Coronary Angiography;  Surgeon: Wonda Sharper, MD;  Location: [facility] INVASIVE CV LAB;  Service: Cardiovascular;  Laterality: N/A;   MELANOMA EXCISION  04/2023   TUBAL LIGATION     VAGINAL HYSTERECTOMY  10/26/1993   Patient Active Problem List   Diagnosis Date Noted   False positive cardiac stress test 10/08/2023   Atherosclerosis of aorta (HCC) 10/08/2023   Allergic angioedema 10/07/2020   Angioedema of lips 10/07/2020   Rash 10/07/2020   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   Anxiety 05/29/2016   Esophageal reflux 05/29/2016   Osteopenia 05/29/2016   Lichen sclerosus et atrophicus 07/24/2015   Overweight (BMI 25.0-29.9) 03/13/2013   Rheumatoid arthritis (HCC) 08/03/2011   Rheumatoid arthritis (HCC) 10/26/2008   Hypercholesterolemia 10/27/2007   Hypothyroid 10/27/2003   Depression with anxiety 10/26/1993    PCP: Perri Ronal PARAS, MD  REFERRING PROVIDER: Perri Ronal PARAS, MD  REFERRING DIAG: M54.2 (ICD-10-CM) - Neck pain on left side T14.8XXA (ICD-10-CM) - Musculoskeletal  strain  THERAPY DIAG:  Cramp and spasm  Abnormal posture  Cervicalgia  Other symptoms and signs involving the musculoskeletal system  Other lack of coordination  Muscle weakness (generalized)  Rationale for Evaluation and Treatment: Rehabilitation  ONSET DATE: January 2025  SUBJECTIVE:  SUBJECTIVE STATEMENT: Pt reports no pain  From eval: Pt states in January she had a heavy mirror on her mantle and went to get the wreath off and pulled the mirror down causing a major L side neck spasm/pain. Pt states difficulty turning her neck after this. Did acupuncture and massage therapy. Pt states she was given flexeril  from her PCP but didn't feel anything from it. Pt reports she was unable to sleep at night. Pt started to take magnesium supplements and is feeling better. Pt likes to knit and do things looking down and has to be careful with lifting. Pt reports it is feeling a little sore this morning. Has also done cupping.  Hand dominance: Right  PERTINENT HISTORY:  From internal medicine H&P: 73 y.o.Female presents today for Left-sided Neck Pain. Patient has a past medical history of Rheumatoid Arthritis. Seen for this on 02/28/2024, and to reiterate, back in January she lifted a heavy mirror over her head and afterwards is when neck pain onset.  PAIN:  Are you having pain? Yes: NPRS scale: 0-2 currently Pain location: L side neck Pain description: sore Aggravating factors: lifting Relieving factors: magnesium, ice, acupuncture helped for a while  PRECAUTIONS: None  RED FLAGS: None    WEIGHT BEARING RESTRICTIONS: No  FALLS:  Has patient fallen in last 6 months? No  LIVING ENVIRONMENT: Lives with: lives with their spouse Lives in: House/apartment  OCCUPATION: Retired but enjoys  Archivist  PLOF: Independent  PATIENT GOALS: Improve muscle pain  NEXT MD VISIT: PRN  OBJECTIVE:  Note: Objective measures were completed at Evaluation unless otherwise noted.  DIAGNOSTIC FINDINGS:  Nothing new or recent  PATIENT SURVEYS:  Neck Disability Index score: 11 / 50 = 22.0 %  POSTURE: rounded shoulders and forward head head slightly in L lateral flexion at rest  PALPATION: TTP longissimus capitus and splenius capitus insertion to base of skull/mastoid process, upper lateral cervical paraspinals Hypomobility with lateral side glides L to R on upper cervical spine   CERVICAL ROM:   Active ROM A/PROM (deg) eval  Flexion 35  Extension 40  Right lateral flexion 23 pulls  Left lateral flexion 36 pain  Right rotation 70  Left rotation 50 pull and pain   (Blank rows = not tested)  UPPER EXTREMITY ROM: WNL  UPPER EXTREMITY MMT:  MMT Right eval Left eval  Shoulder flexion 5 5  Shoulder extension 5 5  Shoulder abduction 5 5  Shoulder adduction    Shoulder extension    Shoulder internal rotation 5 5  Shoulder external rotation 5 5  Middle trapezius TBA TBA  Lower trapezius TBA TBA  Elbow flexion    Elbow extension    Wrist flexion    Wrist extension    Wrist ulnar deviation    Wrist radial deviation    Wrist pronation    Wrist supination    Grip strength     (Blank rows = not tested)  CERVICAL SPECIAL TESTS:  Distraction test: Negative  FUNCTIONAL TESTS:  Did not assess  TREATMENT DATE:  05/18/24 UBE L1.0 x 6 min Standing rows YTB x 20  Standing shoulder extension x 20 YTB Supine B ER YTB 2 x 12 Supine B horiz ABD YTB 2 x 12 Supine chin tucks x 20 Supine Chin tucks + rotations x 10  05/11/24:  Mechanical cervical traction 15 # , 15 min Supine for gentle lateral glides upper cervical spine Supine for flex/rotation stretch to L with contract/ relax technique  Added  supine yellow t band B shoulder ER Supine yellow B shoulder horizontal abd  with B shoulder flexion  Education regarding positioning on sofa to reduce strain on neck, such as adding lumbar support and using pillow under arms as pt 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  05/03/24 Nustep L6 x 6 min UEs/LEs Mechanical cervical traction 14# x 15 min, 60/20 hold/rest   Manual therapy: STM & TPR L upper lateral cervical (close to mastoid process), gentle upper cervical grade II to III lateral glides/mobs Self care: Reviewed stretching HEP and discussed self traction unit for home Supine cervical retraction 10x5 Supine cervical rotation iso 10x5  04/24/24 Trial of mechanical cervical traction 10# x 10 min, 60/20 hold/rest    Demonstrated HEP below  PATIENT EDUCATION:  Education details: added rows,ext, chin tucks Person educated: Patient Education method: Explanation, Demonstration, and Handouts Education comprehension: verbalized understanding, returned demonstration, and needs further education  HOME EXERCISE PROGRAM: Access Code: [email or web address] URL: [email or web address] Date: 04/24/2024 Prepared by: Gellen April Earnie Starring  Exercises - Seated Gentle Upper Trapezius Stretch  - 1 x daily - 7 x weekly - 2 sets - 30 sec hold - Seated Levator Scapulae Stretch  - 1 x daily - 7 x weekly - 2 sets - 30 sec hold - Sub-Occipital Cervical Stretch  - 1 x daily - 7 x weekly - 2 sets - 30 sec hold  ASSESSMENT:  CLINICAL IMPRESSION: Pt with good response to treatment. Focused on postural strength and neck stabilization with cues provided. ROM is improving towards more symmetrical movement, still lack a little more motion on L side. She is noting great improvement in her pain and would like to potentially D/C from therapy if continuing to feel well next visit.   From eval: Patient is a 73 y.o. F who was seen today for physical therapy evaluation and treatment for L neck pain and spasm. Assessment is significant for reduced cervical ROM with some upper cervical  hypomobility and lateral cervical paraspinal muscle trigger points and spasm. Performed trial of mechanical traction to gently stretch cervical muscles and improve cervical spine mobility. Provided pt with stretching to further improve her muscle spasm. Pt will benefit from PT to improve on these deficits for return to all normal function.   OBJECTIVE IMPAIRMENTS: decreased coordination, decreased mobility, decreased ROM, decreased strength, hypomobility, increased fascial restrictions, increased muscle spasms, improper body mechanics, postural dysfunction, and pain.    GOALS: Goals reviewed with patient? Yes  SHORT TERM GOALS: Target date: 05/15/2024   Pt will be ind with initial HEP Baseline:  Goal status: MET-   2.  Pt will demo L = R cervical ROM without pain Baseline:  Goal status: PARTIALLY MET- pulling but no pain, ROM 25% more limited with L rotation and SB 05/18/24  LONG TERM GOALS: Target date: 06/05/2024   Pt will be ind with management and progression of HEP Baseline:  Goal status: INITIAL  2.  Pt will report >/=90% improvement with her overall pain Baseline:  Goal status: INITIAL  3.  Pt will be able to lift and carry at least a gallon of milk without straining her neck Baseline:  Goal status: INITIAL  4.  Pt will have improved NDI to </=0/50 to demo MCID Baseline: 11/50 Goal status: INITIAL    PLAN:  PT FREQUENCY: 1x/week  PT DURATION: 6 weeks  PLANNED INTERVENTIONS: 97164- PT Re-evaluation, 97750- Physical Performance Testing, 97110-Therapeutic exercises, 97530- Therapeutic activity, [id number]- Neuromuscular re-education, 97535- Self Care, 02859- Manual  therapy, G0283- Electrical stimulation (unattended), [phone]- Traction (mechanical), 02966- Ionotophoresis 4mg /ml Dexamethasone, 20560 (1-2 muscles), 20561 (3+ muscles)- Dry Needling, Patient/Family education, Taping, Joint mobilization, Spinal mobilization, Cryotherapy, and Moist heat  PLAN FOR NEXT SESSION:  Assess response to HEP. Traction if indicated? Manual work for c-spine hypomobility. Cervical stability exercises (consider isometrics and initiate chin tucks)   Sol LITTIE Gaskins, PTA 05/18/2024, 3:35 PM

## 2024-05-25 ENCOUNTER — Ambulatory Visit

## 2024-05-25 ENCOUNTER — Other Ambulatory Visit: Payer: Self-pay

## 2024-05-25 DIAGNOSIS — R278 Other lack of coordination: Secondary | ICD-10-CM | POA: Diagnosis not present

## 2024-05-25 DIAGNOSIS — M6281 Muscle weakness (generalized): Secondary | ICD-10-CM

## 2024-05-25 DIAGNOSIS — R29898 Other symptoms and signs involving the musculoskeletal system: Secondary | ICD-10-CM

## 2024-05-25 DIAGNOSIS — R293 Abnormal posture: Secondary | ICD-10-CM | POA: Diagnosis not present

## 2024-05-25 DIAGNOSIS — M542 Cervicalgia: Secondary | ICD-10-CM | POA: Diagnosis not present

## 2024-05-25 DIAGNOSIS — R252 Cramp and spasm: Secondary | ICD-10-CM

## 2024-05-25 NOTE — Therapy (Signed)
 OUTPATIENT PHYSICAL THERAPY TREATMENT/DC SUMMARY Progress Note Reporting Period 05/03/24 to 05/25/24  See note below for Objective Data and Assessment of Progress/Goals.       Patient Name: Pamela Ingram MRN: [id number] DOB:26-Aug-1951, 73 y.o., female Today's Date: 05/25/2024  END OF SESSION:  PT End of Session - 05/25/24 1235     Visit Number 5    Date for PT Re-Evaluation 06/05/24    Authorization Type [employer]    Authorization Time Period auth requested 04/24/24    Progress Note Due on Visit 10    PT Start Time 1145    PT Stop Time 1223    PT Time Calculation (min) 38 min              Past Medical History:  Diagnosis Date   Anxiety    Hyperlipidemia    Melanoma in situ of left upper extremity (HCC) 04/2023   Pleurisy with effusion    Rheumatoid arthritis(714.0)    Thyroid  disease    hypothyroidism   Past Surgical History:  Procedure Laterality Date   COLONOSCOPY     LAPAROSCOPY     LEFT HEART CATH AND CORONARY ANGIOGRAPHY N/A 06/04/2017   Procedure: Left Heart Cath and Coronary Angiography;  Surgeon: Wonda Sharper, MD;  Location: [facility] INVASIVE CV LAB;  Service: Cardiovascular;  Laterality: N/A;   MELANOMA EXCISION  04/2023   TUBAL LIGATION     VAGINAL HYSTERECTOMY  10/26/1993   Patient Active Problem List   Diagnosis Date Noted   False positive cardiac stress test 10/08/2023   Atherosclerosis of aorta (HCC) 10/08/2023   Allergic angioedema 10/07/2020   Angioedema of lips 10/07/2020   Rash 10/07/2020   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   Anxiety 05/29/2016   Esophageal reflux 05/29/2016   Osteopenia 05/29/2016   Lichen sclerosus et atrophicus 07/24/2015   Overweight (BMI 25.0-29.9) 03/13/2013   Rheumatoid arthritis (HCC) 08/03/2011   Rheumatoid arthritis (HCC) 10/26/2008   Hypercholesterolemia 10/27/2007   Hypothyroid 10/27/2003   Depression with anxiety 10/26/1993    PCP: Perri Ronal PARAS, MD  REFERRING PROVIDER:  Perri Ronal PARAS, MD  REFERRING DIAG: M54.2 (ICD-10-CM) - Neck pain on left side T14.8XXA (ICD-10-CM) - Musculoskeletal strain  THERAPY DIAG:  Cramp and spasm  Abnormal posture  Cervicalgia  Other symptoms and signs involving the musculoskeletal system  Other lack of coordination  Muscle weakness (generalized)  Rationale for Evaluation and Treatment: Rehabilitation  ONSET DATE: January 2025  SUBJECTIVE:  SUBJECTIVE STATEMENT: Pt reports travelled to mountains and back this weekend, no problems noted.   From eval: Pt states in January she had a heavy mirror on her mantle and went to get the wreath off and pulled the mirror down causing a major L side neck spasm/pain. Pt states difficulty turning her neck after this. Did acupuncture and massage therapy. Pt states she was given flexeril  from her PCP but didn't feel anything from it. Pt reports she was unable to sleep at night. Pt started to take magnesium supplements and is feeling better. Pt likes to knit and do things looking down and has to be careful with lifting. Pt reports it is feeling a little sore this morning. Has also done cupping.  Hand dominance: Right  PERTINENT HISTORY:  From internal medicine H&P: 73 y.o.Female presents today for Left-sided Neck Pain. Patient has a past medical history of Rheumatoid Arthritis. Seen for this on 02/28/2024, and to reiterate, back in January she lifted a heavy mirror over her head and afterwards is when neck pain onset.  PAIN:  Are you having pain? Yes: NPRS scale: 0-2 currently Pain location: L side neck Pain description: sore Aggravating factors: lifting Relieving factors: magnesium, ice, acupuncture helped for a while  PRECAUTIONS: None  RED FLAGS: None    WEIGHT BEARING RESTRICTIONS:  No  FALLS:  Has patient fallen in last 6 months? No  LIVING ENVIRONMENT: Lives with: lives with their spouse Lives in: House/apartment  OCCUPATION: Retired but enjoys Archivist  PLOF: Independent  PATIENT GOALS: Improve muscle pain  NEXT MD VISIT: PRN  OBJECTIVE:  Note: Objective measures were completed at Evaluation unless otherwise noted.  DIAGNOSTIC FINDINGS:  Nothing new or recent  PATIENT SURVEYS:  Neck Disability Index score: 11 / 50 = 22.0 %  POSTURE: rounded shoulders and forward head head slightly in L lateral flexion at rest  PALPATION: TTP longissimus capitus and splenius capitus insertion to base of skull/mastoid process, upper lateral cervical paraspinals Hypomobility with lateral side glides L to R on upper cervical spine   CERVICAL ROM:   Active ROM A/PROM (deg) eval 05/25/24:    Flexion 35 wfl  Extension 40 wfl  Right lateral flexion 23 pulls   Left lateral flexion 36 pain   Right rotation 70 80  Left rotation 50 pull and pain 65   (Blank rows = not tested)  UPPER EXTREMITY ROM: WNL  UPPER EXTREMITY MMT:  MMT Right eval Left eval  Shoulder flexion 5 5  Shoulder extension 5 5  Shoulder abduction 5 5  Shoulder adduction    Shoulder extension    Shoulder internal rotation 5 5  Shoulder external rotation 5 5  Middle trapezius TBA TBA  Lower trapezius TBA TBA  Elbow flexion    Elbow extension    Wrist flexion    Wrist extension    Wrist ulnar deviation    Wrist radial deviation    Wrist pronation    Wrist supination    Grip strength     (Blank rows = not tested)  CERVICAL SPECIAL TESTS:  Distraction test: Negative  FUNCTIONAL TESTS:  Did not assess  TREATMENT DATE:  05/25/24:  Reassessed goals/progress: Manual :supine for alternating bouts of cervical distraction, stretching L upper traps, L levator, suboccipital muscles, contract /relax for all as well.  Flexion rotation upper cervical spine stretch for L rotation with  contract/ relax technique, 2 sets 6 reps   Therex:  Standing rows YTB x 20  Standing shoulder extension  x 20 YTB Supine B ER YTB 2 x 12 Supine B horiz ABD YTB 2 x 12   05/18/24 UBE L1.0 x 6 min Standing rows YTB x 20  Standing shoulder extension x 20 YTB Supine B ER YTB 2 x 12 Supine B horiz ABD YTB 2 x 12 Supine chin tucks x 20 Supine Chin tucks + rotations x 10  05/11/24:  Mechanical cervical traction 15 # , 15 min Supine for gentle lateral glides upper cervical spine Supine for flex/rotation stretch to L with contract/ relax technique  Added supine yellow t band B shoulder ER Supine yellow B shoulder horizontal abd with B shoulder flexion  Education regarding positioning on sofa to reduce strain on neck, such as adding lumbar support and using pillow under arms as pt 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  05/03/24 Nustep L6 x 6 min UEs/LEs Mechanical cervical traction 14# x 15 min, 60/20 hold/rest   Manual therapy: STM & TPR L upper lateral cervical (close to mastoid process), gentle upper cervical grade II to III lateral glides/mobs Self care: Reviewed stretching HEP and discussed self traction unit for home Supine cervical retraction 10x5 Supine cervical rotation iso 10x5  04/24/24 Trial of mechanical cervical traction 10# x 10 min, 60/20 hold/rest    Demonstrated HEP below  PATIENT EDUCATION:  Education details: added rows,ext, chin tucks Person educated: Patient Education method: Explanation, Demonstration, and Handouts Education comprehension: verbalized understanding, returned demonstration, and needs further education  HOME EXERCISE PROGRAM: Access Code: [email or web address] URL: [email or web address] Date: 04/24/2024 Prepared by: Gellen April Earnie Starring  Exercises - Seated Gentle Upper Trapezius Stretch  - 1 x daily - 7 x weekly - 2 sets - 30 sec hold - Seated Levator Scapulae Stretch  - 1 x daily - 7 x weekly - 2 sets - 30 sec hold - Sub-Occipital  Cervical Stretch  - 1 x daily - 7 x weekly - 2 sets - 30 sec hold  ASSESSMENT:  CLINICAL IMPRESSION: The pt has attended 4 physical therapy sessions due to acute L sided neck strain.  At this point she has met her goals and she is ready to finish her physical therapy program. She is competent with her home routine.  We have discharged today from PT.   From eval: Patient is a 73 y.o. F who was seen today for physical therapy evaluation and treatment for L neck pain and spasm. Assessment is significant for reduced cervical ROM with some upper cervical hypomobility and lateral cervical paraspinal muscle trigger points and spasm. Performed trial of mechanical traction to gently stretch cervical muscles and improve cervical spine mobility. Provided pt with stretching to further improve her muscle spasm. Pt will benefit from PT to improve on these deficits for return to all normal function.   OBJECTIVE IMPAIRMENTS: decreased coordination, decreased mobility, decreased ROM, decreased strength, hypomobility, increased fascial restrictions, increased muscle spasms, improper body mechanics, postural dysfunction, and pain.    GOALS: Goals reviewed with patient? Yes  SHORT TERM GOALS: Target date: 05/15/2024   Pt will be ind with initial HEP Baseline:  Goal status: MET-   2.  Pt will demo L = R cervical ROM without pain Baseline:  Goal status: PARTIALLY MET- pulling but no pain, ROM 25% more limited with L rotation and SB 05/18/24  LONG TERM GOALS: Target date: 06/05/2024   Pt will be ind with management and progression of HEP Baseline:  Goal status: INITIAL  2.  Pt will report >/=90% improvement with  her overall pain Baseline:  Goal status: 05/25/24: met   3.  Pt will be able to lift and carry at least a gallon of milk without straining her neck Baseline:  Goal status: 05/25/24: met   4.  Pt will have improved NDI to </=0/50 to demo MCID Baseline: 11/50 Goal status: 05/25/24: 0% goal met      PLAN:  PT FREQUENCY: 1x/week  PT DURATION: 6 weeks  PLANNED INTERVENTIONS: 97164- PT Re-evaluation, 97750- Physical Performance Testing, 97110-Therapeutic exercises, 97530- Therapeutic activity, 97112- Neuromuscular re-education, 97535- Self Care, 02859- Manual therapy, G0283- Electrical stimulation (unattended), 02987- Traction (mechanical), [id number]- Ionotophoresis 4mg /ml Dexamethasone, 79439 (1-2 muscles), 20561 (3+ muscles)- Dry Needling, Patient/Family education, Taping, Joint mobilization, Spinal mobilization, Cryotherapy, and Moist heat  PLAN FOR NEXT SESSION: Assess response to HEP. Traction if indicated? Manual work for c-spine hypomobility. Cervical stability exercises (consider isometrics and initiate chin tucks)   Damonie Ellenwood L Nabilah Davoli, PT, DPT, OCS 05/25/2024, 12:38 PM

## 2024-05-26 ENCOUNTER — Other Ambulatory Visit (HOSPITAL_BASED_OUTPATIENT_CLINIC_OR_DEPARTMENT_OTHER): Payer: Self-pay

## 2024-05-30 ENCOUNTER — Other Ambulatory Visit (HOSPITAL_BASED_OUTPATIENT_CLINIC_OR_DEPARTMENT_OTHER): Payer: Self-pay

## 2024-05-31 ENCOUNTER — Other Ambulatory Visit (HOSPITAL_BASED_OUTPATIENT_CLINIC_OR_DEPARTMENT_OTHER): Payer: Self-pay

## 2024-05-31 MED ORDER — BUSPIRONE HCL 30 MG PO TABS
ORAL_TABLET | ORAL | 2 refills | Status: DC
  Filled 2024-05-31: qty 60, 30d supply, fill #0

## 2024-06-01 ENCOUNTER — Other Ambulatory Visit (HOSPITAL_BASED_OUTPATIENT_CLINIC_OR_DEPARTMENT_OTHER): Payer: Self-pay

## 2024-06-01 MED ORDER — OTEZLA 30 MG PO TABS
30.0000 mg | ORAL_TABLET | Freq: Two times a day (BID) | ORAL | 11 refills | Status: AC
  Filled 2024-06-01: qty 60, 30d supply, fill #0
  Filled 2024-07-03: qty 60, 30d supply, fill #1
  Filled 2024-08-03: qty 60, 30d supply, fill #2
  Filled 2024-09-03: qty 60, 30d supply, fill #3
  Filled 2024-10-04: qty 60, 30d supply, fill #4
  Filled 2024-11-09: qty 60, 30d supply, fill #5

## 2024-06-23 DIAGNOSIS — H35363 Drusen (degenerative) of macula, bilateral: Secondary | ICD-10-CM | POA: Diagnosis not present

## 2024-06-23 DIAGNOSIS — H524 Presbyopia: Secondary | ICD-10-CM | POA: Diagnosis not present

## 2024-06-23 DIAGNOSIS — H25813 Combined forms of age-related cataract, bilateral: Secondary | ICD-10-CM | POA: Diagnosis not present

## 2024-06-23 DIAGNOSIS — H43813 Vitreous degeneration, bilateral: Secondary | ICD-10-CM | POA: Diagnosis not present

## 2024-07-03 ENCOUNTER — Other Ambulatory Visit: Payer: Self-pay

## 2024-07-03 ENCOUNTER — Other Ambulatory Visit (HOSPITAL_BASED_OUTPATIENT_CLINIC_OR_DEPARTMENT_OTHER): Payer: Self-pay

## 2024-07-11 ENCOUNTER — Other Ambulatory Visit (HOSPITAL_BASED_OUTPATIENT_CLINIC_OR_DEPARTMENT_OTHER): Payer: Self-pay

## 2024-07-11 ENCOUNTER — Other Ambulatory Visit: Payer: Self-pay

## 2024-07-11 MED ORDER — FLUVOXAMINE MALEATE ER 100 MG PO CP24
100.0000 mg | ORAL_CAPSULE | Freq: Every evening | ORAL | 2 refills | Status: DC
  Filled 2024-07-11: qty 30, 30d supply, fill #0
  Filled 2024-08-07: qty 30, 30d supply, fill #1

## 2024-07-20 ENCOUNTER — Other Ambulatory Visit (HOSPITAL_BASED_OUTPATIENT_CLINIC_OR_DEPARTMENT_OTHER): Payer: Self-pay

## 2024-07-20 DIAGNOSIS — L219 Seborrheic dermatitis, unspecified: Secondary | ICD-10-CM | POA: Diagnosis not present

## 2024-07-20 DIAGNOSIS — L409 Psoriasis, unspecified: Secondary | ICD-10-CM | POA: Diagnosis not present

## 2024-07-20 DIAGNOSIS — L400 Psoriasis vulgaris: Secondary | ICD-10-CM | POA: Diagnosis not present

## 2024-07-20 DIAGNOSIS — Z85820 Personal history of malignant melanoma of skin: Secondary | ICD-10-CM | POA: Diagnosis not present

## 2024-07-20 MED ORDER — KETOCONAZOLE 2 % EX CREA
TOPICAL_CREAM | CUTANEOUS | 6 refills | Status: AC
  Filled 2024-07-20: qty 60, 30d supply, fill #0
  Filled 2024-08-14 – 2024-08-16 (×2): qty 60, 30d supply, fill #1

## 2024-07-20 MED ORDER — KETOCONAZOLE 2 % EX SHAM
MEDICATED_SHAMPOO | CUTANEOUS | 11 refills | Status: DC
  Filled 2024-07-20: qty 120, 30d supply, fill #0
  Filled 2024-08-14: qty 120, 30d supply, fill #1

## 2024-07-20 MED ORDER — HYDROCORTISONE 2.5 % EX CREA
TOPICAL_CREAM | CUTANEOUS | 6 refills | Status: AC
  Filled 2024-07-20: qty 60, 30d supply, fill #0
  Filled 2024-08-14 – 2024-08-16 (×2): qty 60, 30d supply, fill #1

## 2024-07-20 MED ORDER — FLUCONAZOLE 200 MG PO TABS
200.0000 mg | ORAL_TABLET | ORAL | 3 refills | Status: AC
  Filled 2024-07-20: qty 4, 28d supply, fill #0
  Filled 2024-08-10: qty 4, 28d supply, fill #1
  Filled 2024-09-07: qty 4, 28d supply, fill #2
  Filled 2024-10-05: qty 4, 28d supply, fill #3

## 2024-08-03 ENCOUNTER — Other Ambulatory Visit (HOSPITAL_COMMUNITY): Payer: Self-pay

## 2024-08-03 ENCOUNTER — Other Ambulatory Visit: Payer: Self-pay

## 2024-08-03 ENCOUNTER — Other Ambulatory Visit (HOSPITAL_BASED_OUTPATIENT_CLINIC_OR_DEPARTMENT_OTHER): Payer: Self-pay

## 2024-08-04 ENCOUNTER — Other Ambulatory Visit (HOSPITAL_BASED_OUTPATIENT_CLINIC_OR_DEPARTMENT_OTHER): Payer: Self-pay

## 2024-08-04 MED ORDER — FLUZONE HIGH-DOSE 0.5 ML IM SUSY
0.5000 mL | PREFILLED_SYRINGE | Freq: Once | INTRAMUSCULAR | 0 refills | Status: AC
Start: 2024-08-04 — End: 2024-08-05
  Filled 2024-08-04: qty 0.5, 1d supply, fill #0

## 2024-08-04 MED ORDER — FLUVOXAMINE MALEATE ER 100 MG PO CP24
100.0000 mg | ORAL_CAPSULE | Freq: Every day | ORAL | 2 refills | Status: DC
  Filled 2024-08-04: qty 30, 30d supply, fill #0

## 2024-08-04 NOTE — Progress Notes (Signed)
 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 Avram, with a past medical history as listed below including rheumatoid arthritis and generalized anxiety disorder, who was referred to me by Perri Ronal PARAS, MD for a complaint of reflux.    02/23/2022 colonoscopy for abdominal pain left lower quadrant no change in bowel habits with diverticulosis in the sigmoid colon otherwise normal.    07/14/2023 office visit with Dr. Avram and at that time discussed full incontinence of feces and referred her to pelvic floor physical therapy.    10/28/2023 CTAP with contrast with changes consistent with sigmoid diverticulitis without evidence of perforation or abscess.    01/24/2024 CBC and CMP and TSH normal.    Today, patient presents to clinic and tells me that back around Christmas she was lifting something off the mantle and strained her neck.  She was using a lot of ibuprofen  daily for a few months and developed severe heartburn a couple of months ago.  She has now had resolution of her neck strain and is no longer using ibuprofen  and over the past month is had no further heartburn.  Tells me that she never had any other symptoms, nausea, vomiting, weight loss or abdominal pain.  It was just reflux/heartburn.  Again currently not have any symptoms.  She feels well.    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  Tells me it was the best job she ever had.    Denies fever, chills or weight loss.  Past Medical History:  Diagnosis Date   Anxiety    Hyperlipidemia    Melanoma in situ of left upper extremity (HCC) 04/2023   Pleurisy with effusion    Rheumatoid arthritis(714.0)    Thyroid  disease    hypothyroidism    Past Surgical History:  Procedure Laterality Date   COLONOSCOPY     LAPAROSCOPY     LEFT HEART CATH AND CORONARY ANGIOGRAPHY N/A 06/04/2017   Procedure: Left Heart Cath and Coronary Angiography;  Surgeon: Wonda Sharper, MD;   Location: [facility] INVASIVE CV LAB;  Service: Cardiovascular;  Laterality: N/A;   MELANOMA EXCISION  04/2023   TUBAL LIGATION     VAGINAL HYSTERECTOMY  10/26/1993    Current Outpatient Medications  Medication Sig Dispense Refill   ALPRAZolam  (XANAX ) 0.5 MG tablet Take 1 tablet (0.5 mg total) by mouth every morning as needed 90 tablet 0   ALPRAZolam  (XANAX ) 0.5 MG tablet Take 1 tablet (0.5 mg total) by mouth in the morning as needed. 90 tablet 1   Apremilast  (OTEZLA ) 30 MG TABS Take 1 tablet (30 mg total) by mouth 2 (two) times daily. 60 tablet 11   busPIRone  (BUSPAR ) 30 MG tablet Take 1 tablet (30 mg total) by mouth every morning AND 1 tablet (30 mg total) every evening. 60 tablet 2   cyclobenzaprine  (FLEXERIL ) 10 MG tablet Take 0.5-1 tablets (5-10 mg total) by mouth at bedtime as needed for neck pain. 30 tablet 1   fluconazole  (DIFLUCAN ) 200 MG tablet Take 1 tablet (200 mg total) by mouth once a week for 4 weeks. 4 tablet 3   Fluvoxamine  Maleate 100 MG CP24 Take 1 capsule (100 mg total) by mouth at bedtime. 30 capsule 1   Fluvoxamine  Maleate 100 MG CP24 Take 1 capsule (100 mg total) by mouth at bedtime. 30 capsule 2   Fluvoxamine  Maleate 100 MG CP24 Take 1 capsule (100 mg total) by mouth at  bedtime. 30 capsule 2   hydrocortisone  2.5 % cream Mix with pea size amount each with ketoconazole  cream. Apply to affected areas twice daily. Stop when smooth. 60 g 6   ibuprofen  (ADVIL ,MOTRIN ) 200 MG tablet Take 800 mg by mouth every 8 (eight) hours as needed for moderate pain.     ketoconazole  (NIZORAL ) 2 % cream Mix with pea size amount each with hydrocortisone  cream. Apply to affected areas twice daily. Stop when smooth. 60 g 6   ketoconazole  (NIZORAL ) 2 % shampoo Apply 1-3 times a week to affected areas as a body wash. Let sit 5-10 min prior to rinsing off. 120 mL 11   levothyroxine  (SYNTHROID ) 75 MCG tablet Take 1 tablet (75 mcg total) by mouth daily. 90 tablet 1   rosuvastatin  (CRESTOR ) 20 MG tablet Take  1 tablet (20 mg total) by mouth daily. 90 tablet 3   Tocilizumab  (ACTEMRA ) 162 MG/0.9ML SOSY Inject 0.9 ml (1 syringe) subcutaneously every week 12 Syringe 3   No current facility-administered medications for this visit.    Allergies as of 08/14/2024 - Review Complete 05/25/2024  Allergen Reaction Noted   Amoxicillin  Other (See Comments) 04/05/2012    Family History  Problem Relation Age of Onset   Emphysema Mother    Rheum arthritis Mother    Hypertension Father    Stroke Father    Diabetes Paternal Uncle    Colon cancer Neg Hx    Rectal cancer Neg Hx    Stomach cancer Neg Hx    Esophageal cancer Neg Hx    Cancer Neg Hx     Social History   Socioeconomic History   Marital status: Married    Spouse name: Not on file   Number of children: 2   Years of education: Not on file   Highest education level: Associate degree: academic program  Occupational History   Occupation: Academic librarian: Adult nurse  Tobacco Use   Smoking status: Former    Current packs/day: 0.00    Average packs/day: 0.5 packs/day for 44.0 years (22.0 ttl pk-yrs)    Types: Cigarettes    Start date: 10/26/1965    Quit date: 10/26/2009    Years since quitting: 14.7   Smokeless tobacco: Never   Tobacco comments:    off and on x 44 years 03/14/13  Vaping Use   Vaping status: Never Used  Substance and Sexual Activity   Alcohol use: Yes    Alcohol/week: 0.0 standard drinks of alcohol    Comment: 1-2 drinks a week   Drug use: No   Sexual activity: Yes    Birth control/protection: Surgical  Other Topics Concern   Not on file  Social History Narrative   Former smoker, quit 2014. Never used smokeless tobacco or illicit drugs. Does consume alcohol socially - EtOH 1-2 drinks/week. 3 cups of caffeine/day. Married. Has 2 adult sons. 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    2025 - She has been helping take care of her neighbor, who is 60 y.o.      Fhx:   No Fhx of Premature  Cardiac Illness, Sudden Death, GI Cancers (Esophageal, Stomach, Colon, or Rectal).    Mother deceased due to Respiratory Complications w/ hx of Emphysema, Rheumatoid Arthritis, Bradycardia and a Pacemaker.   Father deceased due to Complications of Sepsis w/ hx of Stroke in his 63s and Hypertension,   Paternal Uncle w/ hx of Diabetes.   2 Paternal Aunts underwent Endarterectomy in their  26s.    Sister deceased of Breast Cancer.   1 Sister living   Social Drivers of Corporate investment banker Strain: Low Risk  (04/10/2024)   Received from [facility]   Overall Financial Resource Strain (CARDIA)    Difficulty of Paying Living Expenses: Not hard at all  Food Insecurity: No Food Insecurity (04/10/2024)   Received from [facility]   Hunger Vital Sign    Within the past 12 months, you worried that your food would run out before you got the money to buy more.: Never true    Within the past 12 months, the food you bought just didn't last and you didn't have money to get more.: Never true  Transportation Needs: No Transportation Needs (04/10/2024)   Received from [facility] - Transportation    Lack of Transportation (Medical): No    Lack of Transportation (Non-Medical): No  Physical Activity: Insufficiently Active (04/10/2024)   Received from [facility]   Exercise Vital Sign    On average, how many days per week do you engage in moderate to strenuous exercise (like a brisk walk)?: 3 days    On average, how many minutes do you engage in exercise at this level?: 20 min  Stress: No Stress Concern Present (04/10/2024)   Received from [facility] - Occupational Stress Questionnaire    Feeling of Stress : Only a little  Recent Concern: Stress - Stress Concern Present (01/28/2024)   [employer] [facility] Occupational [facility] - Occupational Stress Questionnaire    Feeling of Stress : To some extent  Social Connections: Socially Integrated  (04/10/2024)   Received from [facility]   Social Network    How would you rate your social network (family, work, friends)?: Good participation with social networks  Intimate Partner Violence: Not At Risk (04/10/2024)   Received from [facility]   HITS    Over the last 12 months how often did your partner physically hurt you?: Never    Over the last 12 months how often did your partner insult you or talk down to you?: Never    Over the last 12 months how often did your partner threaten you with physical harm?: Never    Over the last 12 months how often did your partner scream or curse at you?: Never    Review of Systems:    Constitutional: No weight loss, fever or chills Skin: No rash or itching Cardiovascular: No chest pain  Respiratory: No SOB  Gastrointestinal: See HPI and otherwise negative   Physical Exam:  Vital signs: BP 120/70   Ht 5' 3 (1.6 m)   Wt 158 lb (71.7 kg)   BMI 27.99 kg/m    Constitutional:   Pleasant elderly Caucasian female appears to be in NAD, Well developed, Well nourished, alert and cooperative  Respiratory: Respirations even and unlabored. Lungs clear to auscultation bilaterally.   No wheezes, crackles, or rhonchi.  Cardiovascular: Normal S1, S2. No MRG. Regular rate and rhythm. No peripheral edema, cyanosis or pallor.  Gastrointestinal:  Soft, nondistended, nontender. No rebound or guarding. Normal bowel sounds. No appreciable masses or hepatomegaly. Rectal:  Not performed.  Psychiatric: Demonstrates good judgement and reason without abnormal affect or behaviors.  MOST RECENT LABS AND IMAGING: CBC    Component Value Date/Time   WBC 5.6 01/24/2024 0918   RBC 4.44 01/24/2024 0918   HGB 13.4 01/24/2024 0918   HGB 14.0 05/31/2017 0907  HCT 40.4 01/24/2024 0918   HCT 42.7 05/31/2017 0907   PLT 173 01/24/2024 0918   PLT 286 05/31/2017 0907   MCV 91.0 01/24/2024 0918   MCV 90 05/31/2017 0907   MCH 30.2 01/24/2024 0918   MCHC 33.2 01/24/2024  0918   RDW 11.9 01/24/2024 0918   RDW 12.8 05/31/2017 0907   LYMPHSABS 1.8 10/28/2023 2224   MONOABS 1.1 (H) 10/28/2023 2224   EOSABS 431 01/24/2024 0918   BASOSABS 28 01/24/2024 0918    CMP     Component Value Date/Time   NA 142 01/24/2024 0918   NA 142 05/31/2017 0907   K 4.9 01/24/2024 0918   CL 105 01/24/2024 0918   CO2 29 01/24/2024 0918   GLUCOSE 93 01/24/2024 0918   BUN 17 01/24/2024 0918   BUN 21 05/31/2017 0907   CREATININE 0.95 01/24/2024 0918   CALCIUM  9.7 01/24/2024 0918   PROT 7.1 01/24/2024 0918   ALBUMIN 4.2 10/28/2023 2224   AST 22 01/24/2024 0918   ALT 16 01/24/2024 0918   ALKPHOS 56 10/28/2023 2224   BILITOT 0.3 01/24/2024 0918   GFRNONAA >60 10/28/2023 2224   GFRNONAA 79 12/05/2020 1122   GFRAA 92 12/05/2020 1122    Assessment: 1.  Reflux: Started having heartburn/reflux symptoms after a few months of consistent ibuprofen  use for neck strain, stopped using Ibuprofen  and has had no further symptoms over the past month; likely reactive gastritis/NSAID induced gastritis  Plan: 1.  Discussed with patient likely it was her NSAID use.  Would recommend she use this sparingly in the future. 2.  Did discuss that if she has intermittent heartburn symptoms in the future could use over-the-counter Pepcid up to 40 mg twice a day.  If the symptoms become more consistent 3 to 4 days a week then would recommend over-the-counter Prilosec 20 mg daily, 30 minutes for breakfast for 2 weeks.  If she continues with symptoms after that or if this is not helping then she should let us  know. 3.  Patient to follow in clinic with us  as needed.  Pamela Failing, PA-C [facility] 08/04/2024, 11:06 AM  Cc: Perri Ronal PARAS, MD

## 2024-08-07 ENCOUNTER — Other Ambulatory Visit (HOSPITAL_BASED_OUTPATIENT_CLINIC_OR_DEPARTMENT_OTHER): Payer: Self-pay

## 2024-08-10 ENCOUNTER — Other Ambulatory Visit: Payer: Self-pay

## 2024-08-10 ENCOUNTER — Other Ambulatory Visit (HOSPITAL_BASED_OUTPATIENT_CLINIC_OR_DEPARTMENT_OTHER): Payer: Self-pay

## 2024-08-10 DIAGNOSIS — F411 Generalized anxiety disorder: Secondary | ICD-10-CM | POA: Diagnosis not present

## 2024-08-10 MED ORDER — FLUVOXAMINE MALEATE ER 100 MG PO CP24
100.0000 mg | ORAL_CAPSULE | Freq: Every day | ORAL | 1 refills | Status: AC
  Filled 2024-08-28 – 2024-08-29 (×2): qty 90, 90d supply, fill #0
  Filled 2024-11-27: qty 90, 90d supply, fill #1

## 2024-08-10 MED ORDER — ALPRAZOLAM 0.5 MG PO TABS
0.5000 mg | ORAL_TABLET | Freq: Every morning | ORAL | 0 refills | Status: DC
  Filled 2024-08-10: qty 30, 30d supply, fill #0

## 2024-08-14 ENCOUNTER — Other Ambulatory Visit: Payer: Self-pay

## 2024-08-14 ENCOUNTER — Ambulatory Visit: Admitting: Physician Assistant

## 2024-08-14 ENCOUNTER — Other Ambulatory Visit (HOSPITAL_BASED_OUTPATIENT_CLINIC_OR_DEPARTMENT_OTHER): Payer: Self-pay

## 2024-08-14 ENCOUNTER — Encounter: Payer: Self-pay | Admitting: Physician Assistant

## 2024-08-14 VITALS — BP 120/70 | Ht 63.0 in | Wt 158.0 lb

## 2024-08-14 DIAGNOSIS — H43813 Vitreous degeneration, bilateral: Secondary | ICD-10-CM | POA: Diagnosis not present

## 2024-08-14 DIAGNOSIS — K219 Gastro-esophageal reflux disease without esophagitis: Secondary | ICD-10-CM

## 2024-08-14 NOTE — Patient Instructions (Signed)
 Use Pepcid up to 40 mg twice daily as needed for occasional reflux.   Use Nexium or Prilosec 20 mg once daily for 14 days for symptoms lasting longer than 3-4 days.   Follow-up as needed.  _______________________________________________________  If your blood pressure at your visit was 140/90 or greater, please contact your primary care physician to follow up on this.  _______________________________________________________  If you are age 72 or older, your body mass index should be between 23-30. Your Body mass index is 27.99 kg/m. If this is out of the aforementioned range listed, please consider follow up with your Primary Care Provider.  If you are age 56 or younger, your body mass index should be between 19-25. Your Body mass index is 27.99 kg/m. If this is out of the aformentioned range listed, please consider follow up with your Primary Care Provider.   ________________________________________________________  The [facility] providers would like to encourage you to use MYCHART to communicate with providers for non-urgent requests or questions.  Due to long hold times on the telephone, sending your provider a message by [facility] may be a faster and more efficient way to get a response.  Please allow 48 business hours for a response.  Please remember that this is for non-urgent requests.  _______________________________________________________  [facility] is using a team-based approach to care.  Your team is made up of your doctor and two to three APPS. Our APPS (Nurse Practitioners and Physician Assistants) work with your physician to ensure care continuity for you. They are fully qualified to address your health concerns and develop a treatment plan. They communicate directly with your gastroenterologist to care for you. Seeing the Advanced Practice Practitioners on your physician's team can help you by facilitating care more promptly, often allowing for earlier  appointments, access to diagnostic testing, procedures, and other specialty referrals.   Thank you for choosing me and [facility].  Delon Failing , PA-C

## 2024-08-15 ENCOUNTER — Other Ambulatory Visit (HOSPITAL_BASED_OUTPATIENT_CLINIC_OR_DEPARTMENT_OTHER): Payer: Self-pay

## 2024-08-16 ENCOUNTER — Other Ambulatory Visit (HOSPITAL_BASED_OUTPATIENT_CLINIC_OR_DEPARTMENT_OTHER): Payer: Self-pay

## 2024-08-16 ENCOUNTER — Other Ambulatory Visit: Payer: Self-pay

## 2024-08-22 ENCOUNTER — Other Ambulatory Visit (HOSPITAL_BASED_OUTPATIENT_CLINIC_OR_DEPARTMENT_OTHER): Payer: Self-pay

## 2024-08-22 DIAGNOSIS — Z79899 Other long term (current) drug therapy: Secondary | ICD-10-CM | POA: Diagnosis not present

## 2024-08-22 DIAGNOSIS — M0579 Rheumatoid arthritis with rheumatoid factor of multiple sites without organ or systems involvement: Secondary | ICD-10-CM | POA: Diagnosis not present

## 2024-08-22 MED ORDER — CLINDAMYCIN HCL 150 MG PO CAPS
ORAL_CAPSULE | ORAL | 0 refills | Status: AC
  Filled 2024-08-22: qty 19, 7d supply, fill #0

## 2024-08-28 ENCOUNTER — Other Ambulatory Visit (HOSPITAL_BASED_OUTPATIENT_CLINIC_OR_DEPARTMENT_OTHER): Payer: Self-pay

## 2024-08-29 ENCOUNTER — Other Ambulatory Visit (HOSPITAL_BASED_OUTPATIENT_CLINIC_OR_DEPARTMENT_OTHER): Payer: Self-pay

## 2024-08-29 ENCOUNTER — Other Ambulatory Visit: Payer: Self-pay | Admitting: Internal Medicine

## 2024-08-29 MED ORDER — LEVOTHYROXINE SODIUM 75 MCG PO TABS
75.0000 ug | ORAL_TABLET | Freq: Every day | ORAL | 1 refills | Status: AC
  Filled 2024-08-29: qty 90, 90d supply, fill #0
  Filled 2024-11-22: qty 90, 90d supply, fill #1

## 2024-08-29 MED ORDER — CYCLOBENZAPRINE HCL 10 MG PO TABS
5.0000 mg | ORAL_TABLET | Freq: Every evening | ORAL | 1 refills | Status: AC | PRN
  Filled 2024-08-29: qty 90, 90d supply, fill #0

## 2024-08-30 ENCOUNTER — Other Ambulatory Visit (HOSPITAL_BASED_OUTPATIENT_CLINIC_OR_DEPARTMENT_OTHER): Payer: Self-pay

## 2024-09-01 ENCOUNTER — Other Ambulatory Visit (HOSPITAL_BASED_OUTPATIENT_CLINIC_OR_DEPARTMENT_OTHER): Payer: Self-pay | Admitting: Internal Medicine

## 2024-09-01 DIAGNOSIS — Z1231 Encounter for screening mammogram for malignant neoplasm of breast: Secondary | ICD-10-CM

## 2024-09-05 ENCOUNTER — Other Ambulatory Visit (HOSPITAL_BASED_OUTPATIENT_CLINIC_OR_DEPARTMENT_OTHER): Payer: Self-pay

## 2024-09-23 ENCOUNTER — Other Ambulatory Visit: Payer: Self-pay

## 2024-09-23 ENCOUNTER — Ambulatory Visit
Admission: EM | Admit: 2024-09-23 | Discharge: 2024-09-23 | Disposition: A | Discharge status: Home / Self Care | Attending: Internal Medicine | Admitting: Internal Medicine

## 2024-09-23 ENCOUNTER — Ambulatory Visit: Payer: Self-pay | Admitting: Internal Medicine

## 2024-09-23 ENCOUNTER — Ambulatory Visit

## 2024-09-23 DIAGNOSIS — R0981 Nasal congestion: Secondary | ICD-10-CM | POA: Diagnosis not present

## 2024-09-23 DIAGNOSIS — R051 Acute cough: Secondary | ICD-10-CM

## 2024-09-23 DIAGNOSIS — R059 Cough, unspecified: Secondary | ICD-10-CM | POA: Diagnosis not present

## 2024-09-23 HISTORY — DX: Pneumonia, unspecified organism: J18.9

## 2024-09-23 MED ORDER — DOXYCYCLINE HYCLATE 100 MG PO CAPS: 100.0000 mg | ORAL_CAPSULE | Freq: Two times a day (BID) | ORAL | 0 refills | Status: AC

## 2024-09-23 NOTE — ED Triage Notes (Signed)
 Hx of pna. Cough x 1 week. Cough prod green/yellow sputum. Hacking cough. Neighbor has pna is in hospital. No fever. Has had tesslon that was leftover, ibuprofen .

## 2024-09-23 NOTE — ED Provider Notes (Signed)
 Pamela Ingram [facility]    CSN: [id number] Arrival date & time: 09/23/24  0901      History   Chief Complaint Chief Complaint  Patient presents with   Cough    HPI Pamela Ingram is a 73 y.o. female.   Patient presents with cough that has been present for 1 week.  Reports that cough became productive with yellow sputum yesterday.  She does have a history of pneumonia so was concerned for this.  Reports nasal congestion and runny nose as well.  She has had several known sick contacts with 1 being hospitalized for pneumonia.  Denies fever at home.  She has been taking previously prescribed Tessalon  Perles with minimal improvement.  Denies formal diagnosis of asthma or COPD.  Denies shortness of breath.   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   Anxiety 05/29/2016   Esophageal reflux 05/29/2016   Osteopenia 05/29/2016   Lichen sclerosus et atrophicus 07/24/2015   Overweight (BMI 25.0-29.9) 03/13/2013   Rheumatoid arthritis (HCC) 08/03/2011   Rheumatoid arthritis (HCC) 10/26/2008   Hypercholesterolemia 10/27/2007   Hypothyroid 10/27/2003   Depression with anxiety 10/26/1993    Past Surgical History:  Procedure Laterality Date   COLONOSCOPY     LAPAROSCOPY     LEFT HEART CATH AND CORONARY ANGIOGRAPHY N/A 06/04/2017   Procedure: Left Heart Cath and Coronary Angiography;  Surgeon: Wonda Sharper, MD;  Location: [facility] INVASIVE CV LAB;  Service: Cardiovascular;  Laterality: N/A;    MELANOMA EXCISION  04/2023   TUBAL LIGATION     VAGINAL HYSTERECTOMY  10/26/1993    OB History   No obstetric history on file.      Home Medications    Prior to Admission medications   Medication Sig Start Date End Date Taking? Authorizing Provider  doxycycline  (VIBRAMYCIN ) 100 MG capsule Take 1 capsule (100 mg total) by mouth 2 (two) times daily for 7 days. 09/23/24 09/30/24 Yes Tobey Lippard, Darryle BRAVO, FNP  ALPRAZolam  (XANAX ) 0.5 MG tablet Take 1 tablet (0.5 mg total) by mouth in the morning as needed. 03/02/24     Apremilast  (OTEZLA ) 30 MG TABS Take 1 tablet (30 mg total) by mouth 2 (two) times daily. 06/01/24     clindamycin  (CLEOCIN ) 150 MG capsule Take 2 capsules (300 mg) by mouth 1 hour prior to surgery, then take 1 capsule (150 mg) by mouth 4 times daily until gone. 08/22/24     cyclobenzaprine  (FLEXERIL ) 10 MG tablet Take 0.5-1 tablets (5-10 mg total) by mouth at bedtime as needed for neck pain. 08/29/24   Perri Ronal PARAS, MD  fluconazole  (DIFLUCAN ) 200 MG tablet Take 1 tablet (200 mg total) by mouth once a week for 4 weeks. 07/20/24     Fluvoxamine  Maleate 100 MG CP24 Take 1 capsule (100 mg total) by mouth at bedtime. 08/10/24     hydrocortisone  2.5 % cream Mix with pea size amount each with ketoconazole  cream. Apply to affected areas  twice daily. Stop when smooth. 07/20/24     ibuprofen  (ADVIL ,MOTRIN ) 200 MG tablet Take 800 mg by mouth every 8 (eight) hours as needed for moderate pain.    [provider]  ketoconazole  (NIZORAL ) 2 % cream Mix with pea size amount each with hydrocortisone  cream. Apply to affected areas twice daily. Stop when smooth. 07/20/24     levothyroxine  (SYNTHROID ) 75 MCG tablet Take 1 tablet (75 mcg total) by mouth daily. 08/29/24 08/29/25  Perri Ronal PARAS, MD  rosuvastatin  (CRESTOR ) 20 MG tablet Take 1 tablet (20 mg total) by mouth daily. 10/08/23   Croitoru, Mihai, MD  Tocilizumab  (ACTEMRA ) 162 MG/0.9ML SOSY Inject 0.9 ml (1 syringe) subcutaneously every week 06/13/18    Jegede, Olugbemiga E, MD    Family History Family History  Problem Relation Age of Onset   Emphysema Mother    Rheum arthritis Mother    Hypertension Father    Stroke Father    Diabetes Paternal Uncle    Colon cancer Neg Hx    Rectal cancer Neg Hx    Stomach cancer Neg Hx    Esophageal cancer Neg Hx    Cancer Neg Hx     Social History Social History   Tobacco Use   Smoking status: Former    Current packs/day: 0.00    Average packs/day: 0.5 packs/day for 44.0 years (22.0 ttl pk-yrs)    Types: Cigarettes    Start date: 10/26/1965    Quit date: 10/26/2009    Years since quitting: 14.9   Smokeless tobacco: Never   Tobacco comments:    off and on x 44 years 03/14/13  Vaping Use   Vaping status: Never Used  Substance Use Topics   Alcohol use: Yes    Alcohol/week: 0.0 standard drinks of alcohol    Comment: 1-2 drinks a week   Drug use: No     Allergies   Amoxicillin    Review of Systems Review of Systems Per HPI  Physical Exam Triage Vital Signs ED Triage Vitals  Encounter Vitals Group     BP 09/23/24 0908 (!) 155/97     Girls Systolic BP Percentile --      Girls Diastolic BP Percentile --      Boys Systolic BP Percentile --      Boys Diastolic BP Percentile --      Pulse Rate 09/23/24 0908 94     Resp 09/23/24 0908 18     Temp 09/23/24 0908 98 F (36.7 C)     Temp Source 09/23/24 0908 Oral     SpO2 09/23/24 0908 98 %     Weight --      Height --      Head Circumference --      Peak Flow --      Pain Score 09/23/24 0912 0     Pain Loc --      Pain Education --      Exclude from Growth Chart --    No data found.  Updated Vital Signs BP (!) 155/97   Pulse 94   Temp 98 F (36.7 C) (Oral)   Resp 18   SpO2 98%   Visual Acuity Right Eye Distance:   Left Eye Distance:   Bilateral Distance:    Right Eye Near:   Left Eye Near:    Bilateral Near:     Physical Exam Constitutional:      General: She is not in acute distress.    Appearance:  Normal  appearance. She is not toxic-appearing or diaphoretic.  HENT:     Head: Normocephalic and atraumatic.     Right Ear: Tympanic membrane and ear canal normal.     Left Ear: Tympanic membrane and ear canal normal.     Nose: Congestion present.     Mouth/Throat:     Mouth: Mucous membranes are moist.     Pharynx: No posterior oropharyngeal erythema.  Eyes:     Extraocular Movements: Extraocular movements intact.     Conjunctiva/sclera: Conjunctivae normal.     Pupils: Pupils are equal, round, and reactive to light.  Cardiovascular:     Rate and Rhythm: Normal rate and regular rhythm.     Pulses: Normal pulses.     Heart sounds: Normal heart sounds.  Pulmonary:     Effort: Pulmonary effort is normal. No respiratory distress.     Breath sounds: Normal breath sounds. No stridor. No wheezing, rhonchi or rales.  Musculoskeletal:        General: Normal range of motion.     Cervical back: Normal range of motion.  Skin:    General: Skin is warm and dry.  Neurological:     General: No focal deficit present.     Mental Status: She is alert and oriented to person, place, and time. Mental status is at baseline.  Psychiatric:        Mood and Affect: Mood normal.        Behavior: Behavior normal.        Thought Content: Thought content normal.        Judgment: Judgment normal.      UC Treatments / Results  Labs (all labs ordered are listed, but only abnormal results are displayed) Labs Reviewed - No data to display  EKG   Radiology DG Chest 2 View Result Date: 09/23/2024 CLINICAL DATA:  Cough for 1 week. EXAM: CHEST - 2 VIEW COMPARISON:  09/21/2023. FINDINGS: Cardiac silhouette is normal in size and configuration. Normal mediastinal and hilar contours. Clear lungs.  No pleural effusion or pneumothorax. Skeletal structures are intact. IMPRESSION: No active cardiopulmonary disease. Electronically Signed   By: Alm Parkins M.D.   On: 09/23/2024 10:39    Procedures Procedures  (including critical care time)  Medications Ordered in UC Medications - No data to display  Initial Impression / Assessment and Plan / UC Course  I have reviewed the triage vital signs and the nursing notes.  Pertinent labs & imaging results that were available during my care of the patient were reviewed by me and considered in my medical decision making (see chart for details).     Differential diagnoses include community-acquired pneumonia, acute bronchitis, sinus infection, viral upper respiratory infection.  Chest x-ray completed that was negative for any acute cardiopulmonary process.  Given duration of symptoms, worsening cough, patient's decreased immune system will opt to treat with doxycycline  to cover for any secondary bacterial infection.  Advised patient to take this medication with food.  Recommended supportive care and symptom management as well.  Viral testing deferred given duration of symptoms as it would not change treatment.  Advised strict follow-up precautions.  Patient verbalized understanding and was agreeable with plan. Final Clinical Impressions(s) / UC Diagnoses   Final diagnoses:  None   Discharge Instructions   None    ED Prescriptions     Medication Sig Dispense Auth. Provider   doxycycline  (VIBRAMYCIN ) 100 MG capsule Take 1 capsule (100 mg total) by mouth 2 (two) times daily  for 7 days. 14 capsule [place], Indi Willhite E, [place]      PDMP not reviewed this encounter.   Hazen Darryle BRAVO, [place] 09/23/24 1129

## 2024-09-25 ENCOUNTER — Other Ambulatory Visit: Payer: Self-pay | Admitting: Cardiovascular Disease

## 2024-09-25 ENCOUNTER — Other Ambulatory Visit (HOSPITAL_BASED_OUTPATIENT_CLINIC_OR_DEPARTMENT_OTHER): Payer: Self-pay

## 2024-09-25 DIAGNOSIS — E7800 Pure hypercholesterolemia, unspecified: Secondary | ICD-10-CM

## 2024-09-25 MED ORDER — ROSUVASTATIN CALCIUM 20 MG PO TABS
20.0000 mg | ORAL_TABLET | Freq: Every day | ORAL | 3 refills | Status: AC
  Filled 2024-09-25: qty 90, 90d supply, fill #0

## 2024-10-04 ENCOUNTER — Other Ambulatory Visit (HOSPITAL_BASED_OUTPATIENT_CLINIC_OR_DEPARTMENT_OTHER): Payer: Self-pay

## 2024-10-05 ENCOUNTER — Other Ambulatory Visit: Payer: Self-pay

## 2024-11-09 ENCOUNTER — Other Ambulatory Visit (HOSPITAL_BASED_OUTPATIENT_CLINIC_OR_DEPARTMENT_OTHER): Payer: Self-pay

## 2024-11-09 ENCOUNTER — Other Ambulatory Visit: Payer: Self-pay

## 2024-11-22 ENCOUNTER — Other Ambulatory Visit: Payer: Self-pay

## 2024-11-22 ENCOUNTER — Other Ambulatory Visit (HOSPITAL_BASED_OUTPATIENT_CLINIC_OR_DEPARTMENT_OTHER): Payer: Self-pay

## 2024-11-27 ENCOUNTER — Other Ambulatory Visit: Payer: Self-pay

## 2024-11-27 ENCOUNTER — Other Ambulatory Visit (HOSPITAL_BASED_OUTPATIENT_CLINIC_OR_DEPARTMENT_OTHER): Payer: Self-pay

## 2024-11-28 ENCOUNTER — Other Ambulatory Visit (HOSPITAL_BASED_OUTPATIENT_CLINIC_OR_DEPARTMENT_OTHER): Payer: Self-pay

## 2024-11-28 ENCOUNTER — Other Ambulatory Visit: Payer: Self-pay

## 2024-11-30 ENCOUNTER — Other Ambulatory Visit: Payer: Self-pay

## 2024-11-30 ENCOUNTER — Other Ambulatory Visit (HOSPITAL_BASED_OUTPATIENT_CLINIC_OR_DEPARTMENT_OTHER): Payer: Self-pay

## 2024-12-07 ENCOUNTER — Ambulatory Visit (HOSPITAL_BASED_OUTPATIENT_CLINIC_OR_DEPARTMENT_OTHER)
